# Patient Record
Sex: Female | Born: 1955 | Race: Black or African American | Hispanic: No | Smoking: Former smoker
Health system: Southern US, Community
[De-identification: ages and names within clinical notes are randomized; demographics above are authoritative.]

## PROBLEM LIST (undated history)

## (undated) DIAGNOSIS — K635 Polyp of colon: Secondary | ICD-10-CM

## (undated) DIAGNOSIS — E669 Obesity, unspecified: Secondary | ICD-10-CM

## (undated) DIAGNOSIS — C801 Malignant (primary) neoplasm, unspecified: Secondary | ICD-10-CM

## (undated) DIAGNOSIS — K219 Gastro-esophageal reflux disease without esophagitis: Secondary | ICD-10-CM

## (undated) DIAGNOSIS — I8290 Acute embolism and thrombosis of unspecified vein: Secondary | ICD-10-CM

## (undated) DIAGNOSIS — K5792 Diverticulitis of intestine, part unspecified, without perforation or abscess without bleeding: Secondary | ICD-10-CM

## (undated) DIAGNOSIS — I10 Essential (primary) hypertension: Secondary | ICD-10-CM

## (undated) DIAGNOSIS — I82890 Acute embolism and thrombosis of other specified veins: Secondary | ICD-10-CM

## (undated) HISTORY — DX: Acute embolism and thrombosis of other specified veins: I82.890

## (undated) HISTORY — DX: Essential (primary) hypertension: I10

## (undated) HISTORY — DX: Obesity, unspecified: E66.9

## (undated) HISTORY — DX: Gastro-esophageal reflux disease without esophagitis: K21.9

## (undated) HISTORY — PX: TONSILLECTOMY AND ADENOIDECTOMY: SUR1326

## (undated) HISTORY — DX: Diverticulitis of intestine, part unspecified, without perforation or abscess without bleeding: K57.92

## (undated) HISTORY — DX: Acute embolism and thrombosis of unspecified vein: I82.90

## (undated) HISTORY — DX: Malignant (primary) neoplasm, unspecified: C80.1

## (undated) HISTORY — DX: Polyp of colon: K63.5

## (undated) HISTORY — PX: OTHER SURGICAL HISTORY: SHX169

## (undated) HISTORY — PX: BREAST LUMPECTOMY: SHX2

## (undated) HISTORY — PX: ABDOMINAL HYSTERECTOMY: SHX81

---

## 1997-05-31 ENCOUNTER — Ambulatory Visit (HOSPITAL_COMMUNITY): Admission: RE | Admit: 1997-05-31 | Discharge: 1997-05-31 | Payer: Self-pay | Admitting: Cardiology

## 1997-06-22 ENCOUNTER — Ambulatory Visit (HOSPITAL_COMMUNITY): Admission: RE | Admit: 1997-06-22 | Discharge: 1997-06-22 | Payer: Self-pay | Admitting: General Surgery

## 1998-01-11 ENCOUNTER — Ambulatory Visit (HOSPITAL_BASED_OUTPATIENT_CLINIC_OR_DEPARTMENT_OTHER): Admission: RE | Admit: 1998-01-11 | Discharge: 1998-01-11 | Payer: Self-pay | Admitting: Orthopedic Surgery

## 1998-12-06 ENCOUNTER — Emergency Department (HOSPITAL_COMMUNITY): Admission: EM | Admit: 1998-12-06 | Discharge: 1998-12-06 | Payer: Self-pay | Admitting: Emergency Medicine

## 1998-12-06 ENCOUNTER — Encounter: Payer: Self-pay | Admitting: Emergency Medicine

## 2000-05-23 ENCOUNTER — Encounter: Admission: RE | Admit: 2000-05-23 | Discharge: 2000-05-23 | Payer: Self-pay | Admitting: Orthopedic Surgery

## 2000-05-23 ENCOUNTER — Encounter: Payer: Self-pay | Admitting: Orthopedic Surgery

## 2001-04-17 ENCOUNTER — Encounter: Payer: Self-pay | Admitting: Family Medicine

## 2001-04-17 ENCOUNTER — Encounter: Admission: RE | Admit: 2001-04-17 | Discharge: 2001-04-17 | Payer: Self-pay | Admitting: Family Medicine

## 2001-12-08 ENCOUNTER — Other Ambulatory Visit: Admission: RE | Admit: 2001-12-08 | Discharge: 2001-12-08 | Payer: Self-pay | Admitting: Obstetrics and Gynecology

## 2006-04-04 ENCOUNTER — Ambulatory Visit: Payer: Self-pay | Admitting: Family Medicine

## 2006-04-30 ENCOUNTER — Ambulatory Visit: Payer: Self-pay | Admitting: Family Medicine

## 2006-05-09 ENCOUNTER — Other Ambulatory Visit: Admission: RE | Admit: 2006-05-09 | Discharge: 2006-05-09 | Payer: Self-pay | Admitting: Family Medicine

## 2006-05-09 ENCOUNTER — Ambulatory Visit: Payer: Self-pay | Admitting: Family Medicine

## 2006-05-22 ENCOUNTER — Encounter: Admission: RE | Admit: 2006-05-22 | Discharge: 2006-05-22 | Payer: Self-pay | Admitting: Family Medicine

## 2006-07-26 ENCOUNTER — Ambulatory Visit (HOSPITAL_COMMUNITY): Admission: RE | Admit: 2006-07-26 | Discharge: 2006-07-26 | Payer: Self-pay | Admitting: Gastroenterology

## 2006-07-26 ENCOUNTER — Encounter (INDEPENDENT_AMBULATORY_CARE_PROVIDER_SITE_OTHER): Payer: Self-pay | Admitting: Gastroenterology

## 2006-08-22 ENCOUNTER — Ambulatory Visit: Payer: Self-pay | Admitting: Family Medicine

## 2007-04-23 ENCOUNTER — Ambulatory Visit: Payer: Self-pay | Admitting: Family Medicine

## 2007-05-28 ENCOUNTER — Ambulatory Visit (HOSPITAL_BASED_OUTPATIENT_CLINIC_OR_DEPARTMENT_OTHER): Admission: RE | Admit: 2007-05-28 | Discharge: 2007-05-28 | Payer: Self-pay | Admitting: Surgery

## 2007-05-28 ENCOUNTER — Encounter (INDEPENDENT_AMBULATORY_CARE_PROVIDER_SITE_OTHER): Payer: Self-pay | Admitting: Surgery

## 2007-06-05 ENCOUNTER — Ambulatory Visit: Payer: Self-pay | Admitting: Oncology

## 2007-06-12 ENCOUNTER — Ambulatory Visit: Payer: Self-pay | Admitting: Family Medicine

## 2007-07-02 ENCOUNTER — Encounter: Admission: RE | Admit: 2007-07-02 | Discharge: 2007-07-02 | Payer: Self-pay | Admitting: Oncology

## 2007-07-09 ENCOUNTER — Ambulatory Visit (HOSPITAL_COMMUNITY): Admission: RE | Admit: 2007-07-09 | Discharge: 2007-07-09 | Payer: Self-pay | Admitting: Oncology

## 2007-07-09 ENCOUNTER — Encounter (INDEPENDENT_AMBULATORY_CARE_PROVIDER_SITE_OTHER): Payer: Self-pay | Admitting: Interventional Radiology

## 2007-07-22 ENCOUNTER — Ambulatory Visit: Admission: RE | Admit: 2007-07-22 | Discharge: 2007-07-22 | Payer: Self-pay | Admitting: Gynecologic Oncology

## 2007-07-24 ENCOUNTER — Ambulatory Visit: Payer: Self-pay | Admitting: Oncology

## 2007-07-28 LAB — CBC WITH DIFFERENTIAL/PLATELET
BASO%: 0.5 % (ref 0.0–2.0)
EOS%: 5.7 % (ref 0.0–7.0)
HCT: 36.6 % (ref 34.8–46.6)
MCH: 32.8 pg (ref 26.0–34.0)
MCHC: 34 g/dL (ref 32.0–36.0)
MONO#: 0.6 10*3/uL (ref 0.1–0.9)
NEUT%: 52.3 % (ref 39.6–76.8)
RBC: 3.81 10*6/uL (ref 3.70–5.32)
RDW: 14.5 % (ref 11.3–14.5)
WBC: 4.1 10*3/uL (ref 3.9–10.0)
lymph#: 1.1 10*3/uL (ref 0.9–3.3)

## 2007-07-28 LAB — CA 125: CA 125: 202.8 U/mL — ABNORMAL HIGH (ref 0.0–30.2)

## 2007-08-12 ENCOUNTER — Encounter: Payer: Self-pay | Admitting: Obstetrics & Gynecology

## 2007-08-12 ENCOUNTER — Inpatient Hospital Stay (HOSPITAL_COMMUNITY): Admission: RE | Admit: 2007-08-12 | Discharge: 2007-08-18 | Payer: Self-pay | Admitting: Obstetrics & Gynecology

## 2007-08-25 ENCOUNTER — Ambulatory Visit: Payer: Self-pay | Admitting: Oncology

## 2007-09-01 ENCOUNTER — Ambulatory Visit (HOSPITAL_COMMUNITY): Admission: RE | Admit: 2007-09-01 | Discharge: 2007-09-01 | Payer: Self-pay | Admitting: Oncology

## 2007-09-03 LAB — COMPREHENSIVE METABOLIC PANEL
Albumin: 3.5 g/dL (ref 3.5–5.2)
BUN: 5 mg/dL — ABNORMAL LOW (ref 6–23)
Calcium: 9.7 mg/dL (ref 8.4–10.5)
Chloride: 102 mEq/L (ref 96–112)
Glucose, Bld: 111 mg/dL — ABNORMAL HIGH (ref 70–99)
Potassium: 3.6 mEq/L (ref 3.5–5.3)

## 2007-09-03 LAB — CBC WITH DIFFERENTIAL/PLATELET
Basophils Absolute: 0 10*3/uL (ref 0.0–0.1)
Eosinophils Absolute: 0.2 10*3/uL (ref 0.0–0.5)
HGB: 11.8 g/dL (ref 11.6–15.9)
MCV: 97.6 fL (ref 81.0–101.0)
MONO#: 0.6 10*3/uL (ref 0.1–0.9)
NEUT#: 2.3 10*3/uL (ref 1.5–6.5)
RDW: 14.6 % — ABNORMAL HIGH (ref 11.3–14.5)
WBC: 4 10*3/uL (ref 3.9–10.0)
lymph#: 0.8 10*3/uL — ABNORMAL LOW (ref 0.9–3.3)

## 2007-09-03 LAB — CANCER ANTIGEN 27.29: CA 27.29: 30 U/mL (ref 0–39)

## 2007-09-24 LAB — CBC WITH DIFFERENTIAL/PLATELET
EOS%: 1.1 % (ref 0.0–7.0)
MCH: 31.3 pg (ref 26.0–34.0)
MCV: 91.8 fL (ref 81.0–101.0)
MONO%: 16.9 % — ABNORMAL HIGH (ref 0.0–13.0)
NEUT#: 3.9 10*3/uL (ref 1.5–6.5)
RBC: 3.36 10*6/uL — ABNORMAL LOW (ref 3.70–5.32)
RDW: 12.7 % (ref 11.3–14.5)

## 2007-10-01 LAB — CBC WITH DIFFERENTIAL/PLATELET
Eosinophils Absolute: 0 10*3/uL (ref 0.0–0.5)
MONO#: 0.2 10*3/uL (ref 0.1–0.9)
NEUT#: 8.6 10*3/uL — ABNORMAL HIGH (ref 1.5–6.5)
RBC: 3.25 10*6/uL — ABNORMAL LOW (ref 3.70–5.32)
RDW: 13.1 % (ref 11.3–14.5)
WBC: 9.4 10*3/uL (ref 3.9–10.0)
lymph#: 0.6 10*3/uL — ABNORMAL LOW (ref 0.9–3.3)

## 2007-10-01 LAB — COMPREHENSIVE METABOLIC PANEL
Albumin: 3.6 g/dL (ref 3.5–5.2)
Alkaline Phosphatase: 89 U/L (ref 39–117)
BUN: 16 mg/dL (ref 6–23)
Calcium: 9.1 mg/dL (ref 8.4–10.5)
Glucose, Bld: 131 mg/dL — ABNORMAL HIGH (ref 70–99)
Potassium: 3.8 mEq/L (ref 3.5–5.3)

## 2007-10-03 ENCOUNTER — Ambulatory Visit: Admission: RE | Admit: 2007-10-03 | Discharge: 2007-10-03 | Payer: Self-pay | Admitting: Gynecology

## 2007-10-03 ENCOUNTER — Ambulatory Visit: Payer: Self-pay | Admitting: Oncology

## 2007-10-06 ENCOUNTER — Ambulatory Visit: Payer: Self-pay | Admitting: Oncology

## 2007-10-06 ENCOUNTER — Inpatient Hospital Stay (HOSPITAL_COMMUNITY): Admission: EM | Admit: 2007-10-06 | Discharge: 2007-10-10 | Payer: Self-pay | Admitting: Emergency Medicine

## 2007-10-14 LAB — CBC WITH DIFFERENTIAL/PLATELET
Basophils Absolute: 0.1 10*3/uL (ref 0.0–0.1)
Eosinophils Absolute: 0 10*3/uL (ref 0.0–0.5)
HCT: 27.6 % — ABNORMAL LOW (ref 34.8–46.6)
HGB: 9.2 g/dL — ABNORMAL LOW (ref 11.6–15.9)
LYMPH%: 8.8 % — ABNORMAL LOW (ref 14.0–48.0)
MONO#: 1.3 10*3/uL — ABNORMAL HIGH (ref 0.1–0.9)
NEUT#: 12.5 10*3/uL — ABNORMAL HIGH (ref 1.5–6.5)
NEUT%: 82.1 % — ABNORMAL HIGH (ref 39.6–76.8)
Platelets: 293 10*3/uL (ref 145–400)
WBC: 15.2 10*3/uL — ABNORMAL HIGH (ref 3.9–10.0)

## 2007-10-22 ENCOUNTER — Encounter (HOSPITAL_COMMUNITY): Admission: RE | Admit: 2007-10-22 | Discharge: 2007-10-25 | Payer: Self-pay | Admitting: Oncology

## 2007-10-22 ENCOUNTER — Ambulatory Visit: Payer: Self-pay | Admitting: Oncology

## 2007-10-22 LAB — COMPREHENSIVE METABOLIC PANEL
Albumin: 3.7 g/dL (ref 3.5–5.2)
BUN: 15 mg/dL (ref 6–23)
CO2: 20 mEq/L (ref 19–32)
Calcium: 9.2 mg/dL (ref 8.4–10.5)
Chloride: 102 mEq/L (ref 96–112)
Creatinine, Ser: 0.83 mg/dL (ref 0.40–1.20)
Glucose, Bld: 171 mg/dL — ABNORMAL HIGH (ref 70–99)
Potassium: 3.9 mEq/L (ref 3.5–5.3)

## 2007-10-22 LAB — CBC WITH DIFFERENTIAL/PLATELET
BASO%: 0 % (ref 0.0–2.0)
HCT: 23 % — ABNORMAL LOW (ref 34.8–46.6)
LYMPH%: 4.3 % — ABNORMAL LOW (ref 14.0–48.0)
MCHC: 34.5 g/dL (ref 32.0–36.0)
MONO#: 0.5 10*3/uL (ref 0.1–0.9)
NEUT%: 91.9 % — ABNORMAL HIGH (ref 39.6–76.8)
Platelets: 242 10*3/uL (ref 145–400)
WBC: 12.7 10*3/uL — ABNORMAL HIGH (ref 3.9–10.0)

## 2007-10-22 LAB — TYPE & CROSSMATCH - CHCC

## 2007-10-29 LAB — CBC WITH DIFFERENTIAL/PLATELET
Eosinophils Absolute: 0 10*3/uL (ref 0.0–0.5)
MCV: 92.7 fL (ref 81.0–101.0)
MONO#: 0.9 10*3/uL (ref 0.1–0.9)
MONO%: 6.7 % (ref 0.0–13.0)
NEUT#: 11.8 10*3/uL — ABNORMAL HIGH (ref 1.5–6.5)
RBC: 3.19 10*6/uL — ABNORMAL LOW (ref 3.70–5.32)
RDW: 16.9 % — ABNORMAL HIGH (ref 11.3–14.5)
WBC: 13.5 10*3/uL — ABNORMAL HIGH (ref 3.9–10.0)
lymph#: 0.8 10*3/uL — ABNORMAL LOW (ref 0.9–3.3)

## 2007-10-29 LAB — COMPREHENSIVE METABOLIC PANEL
AST: 15 U/L (ref 0–37)
Albumin: 3.9 g/dL (ref 3.5–5.2)
Alkaline Phosphatase: 95 U/L (ref 39–117)
Chloride: 102 mEq/L (ref 96–112)
Glucose, Bld: 95 mg/dL (ref 70–99)
Potassium: 3.7 mEq/L (ref 3.5–5.3)
Sodium: 137 mEq/L (ref 135–145)
Total Protein: 7.7 g/dL (ref 6.0–8.3)

## 2007-11-06 LAB — CBC WITH DIFFERENTIAL/PLATELET
Eosinophils Absolute: 0 10*3/uL (ref 0.0–0.5)
MONO#: 0.7 10*3/uL (ref 0.1–0.9)
NEUT#: 4.3 10*3/uL (ref 1.5–6.5)
Platelets: 164 10*3/uL (ref 145–400)
RBC: 3.15 10*6/uL — ABNORMAL LOW (ref 3.70–5.32)
RDW: 16.2 % — ABNORMAL HIGH (ref 11.3–14.5)
WBC: 6.1 10*3/uL (ref 3.9–10.0)
lymph#: 1.1 10*3/uL (ref 0.9–3.3)

## 2007-11-19 LAB — COMPREHENSIVE METABOLIC PANEL
CO2: 20 mEq/L (ref 19–32)
Calcium: 9.7 mg/dL (ref 8.4–10.5)
Chloride: 97 mEq/L (ref 96–112)
Creatinine, Ser: 0.88 mg/dL (ref 0.40–1.20)
Glucose, Bld: 127 mg/dL — ABNORMAL HIGH (ref 70–99)
Total Bilirubin: 0.3 mg/dL (ref 0.3–1.2)
Total Protein: 7.6 g/dL (ref 6.0–8.3)

## 2007-11-19 LAB — CBC WITH DIFFERENTIAL/PLATELET
Eosinophils Absolute: 0 10*3/uL (ref 0.0–0.5)
HCT: 26.7 % — ABNORMAL LOW (ref 34.8–46.6)
HGB: 9.3 g/dL — ABNORMAL LOW (ref 11.6–15.9)
LYMPH%: 5.5 % — ABNORMAL LOW (ref 14.0–48.0)
MONO#: 0.4 10*3/uL (ref 0.1–0.9)
NEUT#: 10.6 10*3/uL — ABNORMAL HIGH (ref 1.5–6.5)
NEUT%: 91 % — ABNORMAL HIGH (ref 39.6–76.8)
Platelets: 324 10*3/uL (ref 145–400)
WBC: 11.7 10*3/uL — ABNORMAL HIGH (ref 3.9–10.0)

## 2007-11-26 ENCOUNTER — Encounter (HOSPITAL_COMMUNITY): Admission: RE | Admit: 2007-11-26 | Discharge: 2008-02-05 | Payer: Self-pay | Admitting: Oncology

## 2007-11-26 LAB — CBC WITH DIFFERENTIAL/PLATELET
BASO%: 2.3 % — ABNORMAL HIGH (ref 0.0–2.0)
Basophils Absolute: 0.1 10*3/uL (ref 0.0–0.1)
EOS%: 0.9 % (ref 0.0–7.0)
HGB: 7.9 g/dL — ABNORMAL LOW (ref 11.6–15.9)
MCH: 29.9 pg (ref 26.0–34.0)
MCHC: 32.3 g/dL (ref 32.0–36.0)
MONO#: 0.7 10*3/uL (ref 0.1–0.9)
RDW: 16.9 % — ABNORMAL HIGH (ref 11.3–14.5)
WBC: 2.6 10*3/uL — ABNORMAL LOW (ref 3.9–10.0)
lymph#: 0.8 10*3/uL — ABNORMAL LOW (ref 0.9–3.3)

## 2007-12-03 LAB — CBC WITH DIFFERENTIAL/PLATELET
Eosinophils Absolute: 0 10*3/uL (ref 0.0–0.5)
HGB: 10.6 g/dL — ABNORMAL LOW (ref 11.6–15.9)
MONO#: 0.4 10*3/uL (ref 0.1–0.9)
NEUT#: 16.8 10*3/uL — ABNORMAL HIGH (ref 1.5–6.5)
RBC: 3.31 10*6/uL — ABNORMAL LOW (ref 3.70–5.32)
RDW: 16.4 % — ABNORMAL HIGH (ref 11.3–14.5)
WBC: 18.1 10*3/uL — ABNORMAL HIGH (ref 3.9–10.0)

## 2007-12-08 ENCOUNTER — Ambulatory Visit: Payer: Self-pay | Admitting: Oncology

## 2007-12-10 LAB — CBC WITH DIFFERENTIAL/PLATELET
Eosinophils Absolute: 0 10*3/uL (ref 0.0–0.5)
MONO#: 0.7 10*3/uL (ref 0.1–0.9)
NEUT#: 13.8 10*3/uL — ABNORMAL HIGH (ref 1.5–6.5)
RBC: 3.45 10*6/uL — ABNORMAL LOW (ref 3.70–5.32)
RDW: 16.7 % — ABNORMAL HIGH (ref 11.3–14.5)
WBC: 15.2 10*3/uL — ABNORMAL HIGH (ref 3.9–10.0)

## 2007-12-17 LAB — COMPREHENSIVE METABOLIC PANEL
AST: 13 U/L (ref 0–37)
Albumin: 3.6 g/dL (ref 3.5–5.2)
BUN: 17 mg/dL (ref 6–23)
CO2: 25 mEq/L (ref 19–32)
Calcium: 9.3 mg/dL (ref 8.4–10.5)
Chloride: 102 mEq/L (ref 96–112)
Glucose, Bld: 96 mg/dL (ref 70–99)
Potassium: 4 mEq/L (ref 3.5–5.3)

## 2007-12-17 LAB — CBC WITH DIFFERENTIAL/PLATELET
Basophils Absolute: 0 10*3/uL (ref 0.0–0.1)
Eosinophils Absolute: 0 10*3/uL (ref 0.0–0.5)
HCT: 26.7 % — ABNORMAL LOW (ref 34.8–46.6)
HGB: 9.1 g/dL — ABNORMAL LOW (ref 11.6–15.9)
LYMPH%: 16.8 % (ref 14.0–48.0)
MONO#: 0.8 10*3/uL (ref 0.1–0.9)
NEUT#: 3.8 10*3/uL (ref 1.5–6.5)
NEUT%: 68.5 % (ref 39.6–76.8)
Platelets: 167 10*3/uL (ref 145–400)
WBC: 5.6 10*3/uL (ref 3.9–10.0)

## 2007-12-31 LAB — CBC WITH DIFFERENTIAL/PLATELET
EOS%: 0 % (ref 0.0–7.0)
Eosinophils Absolute: 0 10*3/uL (ref 0.0–0.5)
MCV: 91.9 fL (ref 81.0–101.0)
MONO%: 6.1 % (ref 0.0–13.0)
NEUT#: 10.6 10*3/uL — ABNORMAL HIGH (ref 1.5–6.5)
RBC: 2.96 10*6/uL — ABNORMAL LOW (ref 3.70–5.32)
RDW: 16.6 % — ABNORMAL HIGH (ref 11.3–14.5)
WBC: 12 10*3/uL — ABNORMAL HIGH (ref 3.9–10.0)

## 2008-01-07 LAB — CBC WITH DIFFERENTIAL/PLATELET
BASO%: 0.5 % (ref 0.0–2.0)
EOS%: 0.4 % (ref 0.0–7.0)
HCT: 23 % — ABNORMAL LOW (ref 34.8–46.6)
MCH: 32.8 pg (ref 26.0–34.0)
MCHC: 34.4 g/dL (ref 32.0–36.0)
NEUT%: 58 % (ref 39.6–76.8)
RBC: 2.41 10*6/uL — ABNORMAL LOW (ref 3.70–5.32)
WBC: 2.9 10*3/uL — ABNORMAL LOW (ref 3.9–10.0)
lymph#: 0.7 10*3/uL — ABNORMAL LOW (ref 0.9–3.3)

## 2008-01-08 LAB — TYPE & CROSSMATCH - CHCC

## 2008-01-09 ENCOUNTER — Ambulatory Visit: Admission: RE | Admit: 2008-01-09 | Discharge: 2008-04-07 | Payer: Self-pay | Admitting: Radiation Oncology

## 2008-01-19 LAB — CBC WITH DIFFERENTIAL/PLATELET
EOS%: 1 % (ref 0.0–7.0)
Eosinophils Absolute: 0.1 10*3/uL (ref 0.0–0.5)
MCH: 31.3 pg (ref 26.0–34.0)
MCV: 91.7 fL (ref 81.0–101.0)
MONO%: 14.1 % — ABNORMAL HIGH (ref 0.0–13.0)
NEUT#: 4.7 10*3/uL (ref 1.5–6.5)
RBC: 3.48 10*6/uL — ABNORMAL LOW (ref 3.70–5.32)
RDW: 17.3 % — ABNORMAL HIGH (ref 11.3–14.5)
lymph#: 1.3 10*3/uL (ref 0.9–3.3)

## 2008-03-05 ENCOUNTER — Ambulatory Visit: Payer: Self-pay | Admitting: Oncology

## 2008-03-29 LAB — COMPREHENSIVE METABOLIC PANEL
ALT: 13 U/L (ref 0–35)
Albumin: 4 g/dL (ref 3.5–5.2)
CO2: 22 mEq/L (ref 19–32)
Calcium: 9.6 mg/dL (ref 8.4–10.5)
Chloride: 103 mEq/L (ref 96–112)
Glucose, Bld: 163 mg/dL — ABNORMAL HIGH (ref 70–99)
Potassium: 3.8 mEq/L (ref 3.5–5.3)
Sodium: 137 mEq/L (ref 135–145)
Total Bilirubin: 0.4 mg/dL (ref 0.3–1.2)
Total Protein: 8 g/dL (ref 6.0–8.3)

## 2008-03-29 LAB — CBC WITH DIFFERENTIAL/PLATELET
BASO%: 0.3 % (ref 0.0–2.0)
Eosinophils Absolute: 0.1 10*3/uL (ref 0.0–0.5)
MCHC: 34 g/dL (ref 31.5–36.0)
MONO#: 0.6 10*3/uL (ref 0.1–0.9)
NEUT#: 2.6 10*3/uL (ref 1.5–6.5)
RBC: 3.51 10*6/uL — ABNORMAL LOW (ref 3.70–5.45)
RDW: 15.7 % — ABNORMAL HIGH (ref 11.2–14.5)
WBC: 4.3 10*3/uL (ref 3.9–10.3)
lymph#: 0.9 10*3/uL (ref 0.9–3.3)

## 2008-04-07 ENCOUNTER — Ambulatory Visit (HOSPITAL_COMMUNITY): Admission: RE | Admit: 2008-04-07 | Discharge: 2008-04-07 | Payer: Self-pay | Admitting: Oncology

## 2008-05-05 ENCOUNTER — Ambulatory Visit: Payer: Self-pay | Admitting: Family Medicine

## 2008-05-10 ENCOUNTER — Encounter: Admission: RE | Admit: 2008-05-10 | Discharge: 2008-05-10 | Payer: Self-pay | Admitting: Oncology

## 2008-06-16 ENCOUNTER — Ambulatory Visit: Payer: Self-pay | Admitting: Oncology

## 2008-06-18 LAB — COMPREHENSIVE METABOLIC PANEL
AST: 29 U/L (ref 0–37)
Albumin: 3.7 g/dL (ref 3.5–5.2)
BUN: 14 mg/dL (ref 6–23)
Calcium: 9.9 mg/dL (ref 8.4–10.5)
Chloride: 101 mEq/L (ref 96–112)
Creatinine, Ser: 0.98 mg/dL (ref 0.40–1.20)
Glucose, Bld: 109 mg/dL — ABNORMAL HIGH (ref 70–99)
Potassium: 3.7 mEq/L (ref 3.5–5.3)

## 2008-06-18 LAB — CANCER ANTIGEN 27.29: CA 27.29: 80 U/mL — ABNORMAL HIGH (ref 0–39)

## 2008-06-18 LAB — CBC WITH DIFFERENTIAL/PLATELET
Eosinophils Absolute: 0.1 10*3/uL (ref 0.0–0.5)
HCT: 32.6 % — ABNORMAL LOW (ref 34.8–46.6)
LYMPH%: 28.9 % (ref 14.0–49.7)
MCHC: 33.4 g/dL (ref 31.5–36.0)
MCV: 100.3 fL (ref 79.5–101.0)
MONO#: 0.5 10*3/uL (ref 0.1–0.9)
MONO%: 12.2 % (ref 0.0–14.0)
NEUT#: 2.4 10*3/uL (ref 1.5–6.5)
NEUT%: 56.8 % (ref 38.4–76.8)
Platelets: 169 10*3/uL (ref 145–400)
WBC: 4.2 10*3/uL (ref 3.9–10.3)

## 2008-07-21 ENCOUNTER — Ambulatory Visit (HOSPITAL_COMMUNITY): Admission: RE | Admit: 2008-07-21 | Discharge: 2008-07-21 | Payer: Self-pay | Admitting: Oncology

## 2008-09-14 ENCOUNTER — Ambulatory Visit: Payer: Self-pay | Admitting: Oncology

## 2008-09-16 LAB — CBC & DIFF AND RETIC
BASO%: 0.1 % (ref 0.0–2.0)
Eosinophils Absolute: 0.2 10*3/uL (ref 0.0–0.5)
HCT: 32.1 % — ABNORMAL LOW (ref 34.8–46.6)
Immature Retic Fract: 14.9 % — ABNORMAL HIGH (ref 0.00–10.70)
MCHC: 34 g/dL (ref 31.5–36.0)
MONO#: 0.7 10*3/uL (ref 0.1–0.9)
NEUT#: 5 10*3/uL (ref 1.5–6.5)
NEUT%: 68.4 % (ref 38.4–76.8)
Platelets: 185 10*3/uL (ref 145–400)
Retic %: 3.05 % — ABNORMAL HIGH (ref 0.50–1.50)
WBC: 7.3 10*3/uL (ref 3.9–10.3)
lymph#: 1.4 10*3/uL (ref 0.9–3.3)

## 2008-09-16 LAB — MORPHOLOGY: PLT EST: ADEQUATE

## 2008-09-20 LAB — HEMOGLOBINOPATHY EVALUATION: Hgb A2 Quant: 2.5 % (ref 2.2–3.2)

## 2008-09-20 LAB — FOLATE RBC: RBC Folate: 484 ng/mL (ref 180–600)

## 2008-09-20 LAB — IRON AND TIBC
Iron: 35 ug/dL — ABNORMAL LOW (ref 42–145)
TIBC: 303 ug/dL (ref 250–470)

## 2008-09-20 LAB — FERRITIN: Ferritin: 414 ng/mL — ABNORMAL HIGH (ref 10–291)

## 2008-09-20 LAB — VITAMIN B12: Vitamin B-12: 249 pg/mL (ref 211–911)

## 2008-09-20 LAB — CANCER ANTIGEN 27.29: CA 27.29: 41 U/mL — ABNORMAL HIGH (ref 0–39)

## 2008-12-20 ENCOUNTER — Ambulatory Visit: Payer: Self-pay | Admitting: Oncology

## 2008-12-22 LAB — CBC & DIFF AND RETIC
Eosinophils Absolute: 0.1 10*3/uL (ref 0.0–0.5)
HCT: 33.1 % — ABNORMAL LOW (ref 34.8–46.6)
LYMPH%: 22.3 % (ref 14.0–49.7)
MCV: 98.2 fL (ref 79.5–101.0)
MONO#: 0.6 10*3/uL (ref 0.1–0.9)
MONO%: 10.9 % (ref 0.0–14.0)
NEUT#: 3.5 10*3/uL (ref 1.5–6.5)
NEUT%: 64 % (ref 38.4–76.8)
Platelets: 204 10*3/uL (ref 145–400)
RBC: 3.37 10*6/uL — ABNORMAL LOW (ref 3.70–5.45)
Retic %: 2.45 % — ABNORMAL HIGH (ref 0.50–1.50)
WBC: 5.4 10*3/uL (ref 3.9–10.3)

## 2008-12-22 LAB — MORPHOLOGY

## 2008-12-22 LAB — COMPREHENSIVE METABOLIC PANEL
Albumin: 3.7 g/dL (ref 3.5–5.2)
Alkaline Phosphatase: 122 U/L — ABNORMAL HIGH (ref 39–117)
BUN: 10 mg/dL (ref 6–23)
CO2: 22 mEq/L (ref 19–32)
Calcium: 8.9 mg/dL (ref 8.4–10.5)
Chloride: 104 mEq/L (ref 96–112)
Glucose, Bld: 112 mg/dL — ABNORMAL HIGH (ref 70–99)
Potassium: 3.5 mEq/L (ref 3.5–5.3)
Sodium: 138 mEq/L (ref 135–145)
Total Protein: 7.2 g/dL (ref 6.0–8.3)

## 2008-12-22 LAB — IRON AND TIBC
%SAT: 23 % (ref 20–55)
Iron: 67 ug/dL (ref 42–145)

## 2008-12-22 LAB — FERRITIN: Ferritin: 459 ng/mL — ABNORMAL HIGH (ref 10–291)

## 2008-12-22 LAB — CANCER ANTIGEN 27.29: CA 27.29: 34 U/mL (ref 0–39)

## 2009-02-02 ENCOUNTER — Ambulatory Visit: Admission: RE | Admit: 2009-02-02 | Discharge: 2009-02-02 | Payer: Self-pay | Admitting: Gynecologic Oncology

## 2009-04-11 ENCOUNTER — Ambulatory Visit: Payer: Self-pay | Admitting: Oncology

## 2009-04-13 LAB — CBC WITH DIFFERENTIAL/PLATELET
BASO%: 0.3 % (ref 0.0–2.0)
EOS%: 1.4 % (ref 0.0–7.0)
LYMPH%: 20.5 % (ref 14.0–49.7)
MCHC: 34.3 g/dL (ref 31.5–36.0)
MCV: 101.6 fL — ABNORMAL HIGH (ref 79.5–101.0)
MONO%: 13 % (ref 0.0–14.0)
Platelets: 176 10*3/uL (ref 145–400)
RBC: 3.42 10*6/uL — ABNORMAL LOW (ref 3.70–5.45)

## 2009-04-13 LAB — COMPREHENSIVE METABOLIC PANEL
ALT: 36 U/L — ABNORMAL HIGH (ref 0–35)
AST: 54 U/L — ABNORMAL HIGH (ref 0–37)
Alkaline Phosphatase: 128 U/L — ABNORMAL HIGH (ref 39–117)
Sodium: 138 mEq/L (ref 135–145)
Total Bilirubin: 0.5 mg/dL (ref 0.3–1.2)
Total Protein: 7.6 g/dL (ref 6.0–8.3)

## 2009-04-13 LAB — CANCER ANTIGEN 27.29: CA 27.29: 44 U/mL — ABNORMAL HIGH (ref 0–39)

## 2009-04-25 ENCOUNTER — Ambulatory Visit (HOSPITAL_COMMUNITY): Admission: RE | Admit: 2009-04-25 | Discharge: 2009-04-25 | Payer: Self-pay | Admitting: Oncology

## 2009-06-30 ENCOUNTER — Ambulatory Visit: Payer: Self-pay | Admitting: Family Medicine

## 2009-07-06 ENCOUNTER — Encounter: Admission: RE | Admit: 2009-07-06 | Discharge: 2009-07-06 | Payer: Self-pay | Admitting: Family Medicine

## 2009-07-10 ENCOUNTER — Encounter: Admission: RE | Admit: 2009-07-10 | Discharge: 2009-07-10 | Payer: Self-pay | Admitting: Oncology

## 2009-07-13 ENCOUNTER — Ambulatory Visit: Admission: RE | Admit: 2009-07-13 | Discharge: 2009-07-13 | Payer: Self-pay | Admitting: Gynecologic Oncology

## 2009-07-13 ENCOUNTER — Ambulatory Visit: Payer: Self-pay | Admitting: Oncology

## 2009-07-13 LAB — CA 125: CA 125: 7.2 U/mL (ref 0.0–30.2)

## 2009-08-01 ENCOUNTER — Ambulatory Visit: Payer: Self-pay | Admitting: Family Medicine

## 2009-08-10 ENCOUNTER — Ambulatory Visit: Payer: Self-pay | Admitting: Family Medicine

## 2009-09-12 ENCOUNTER — Ambulatory Visit: Payer: Self-pay | Admitting: Family Medicine

## 2009-09-14 ENCOUNTER — Encounter: Admission: RE | Admit: 2009-09-14 | Discharge: 2009-09-14 | Payer: Self-pay | Admitting: Family Medicine

## 2009-10-11 ENCOUNTER — Ambulatory Visit: Payer: Self-pay | Admitting: Oncology

## 2009-10-14 LAB — CBC WITH DIFFERENTIAL/PLATELET
Basophils Absolute: 0 10*3/uL (ref 0.0–0.1)
EOS%: 2.6 % (ref 0.0–7.0)
HCT: 36.2 % (ref 34.8–46.6)
MCHC: 33.2 g/dL (ref 31.5–36.0)
MONO#: 0.6 10*3/uL (ref 0.1–0.9)
NEUT#: 3.9 10*3/uL (ref 1.5–6.5)
NEUT%: 68.7 % (ref 38.4–76.8)
Platelets: 235 10*3/uL (ref 145–400)

## 2009-10-14 LAB — LACTATE DEHYDROGENASE: LDH: 160 U/L (ref 94–250)

## 2009-10-14 LAB — COMPREHENSIVE METABOLIC PANEL
ALT: 22 U/L (ref 0–35)
AST: 29 U/L (ref 0–37)
BUN: 11 mg/dL (ref 6–23)
CO2: 28 mEq/L (ref 19–32)
Calcium: 9.4 mg/dL (ref 8.4–10.5)
Sodium: 140 mEq/L (ref 135–145)
Total Protein: 7.8 g/dL (ref 6.0–8.3)

## 2009-10-15 LAB — CA 125: CA 125: 6.4 U/mL (ref 0.0–30.2)

## 2009-10-15 LAB — CANCER ANTIGEN 27.29: CA 27.29: 29 U/mL (ref 0–39)

## 2010-02-02 ENCOUNTER — Ambulatory Visit
Admission: RE | Admit: 2010-02-02 | Discharge: 2010-02-02 | Payer: Self-pay | Source: Home / Self Care | Attending: Gynecologic Oncology | Admitting: Gynecologic Oncology

## 2010-02-25 ENCOUNTER — Other Ambulatory Visit: Payer: Self-pay | Admitting: Oncology

## 2010-02-25 DIAGNOSIS — Z853 Personal history of malignant neoplasm of breast: Secondary | ICD-10-CM

## 2010-02-25 DIAGNOSIS — Z780 Asymptomatic menopausal state: Secondary | ICD-10-CM

## 2010-02-26 ENCOUNTER — Encounter: Payer: Self-pay | Admitting: Oncology

## 2010-02-26 ENCOUNTER — Encounter: Payer: Self-pay | Admitting: Family Medicine

## 2010-04-10 ENCOUNTER — Ambulatory Visit
Admission: RE | Admit: 2010-04-10 | Discharge: 2010-04-10 | Disposition: A | Discharge status: Home / Self Care | Payer: Medicare Other | Source: Ambulatory Visit | Attending: Oncology | Admitting: Oncology

## 2010-04-10 DIAGNOSIS — Z780 Asymptomatic menopausal state: Secondary | ICD-10-CM

## 2010-04-10 DIAGNOSIS — Z853 Personal history of malignant neoplasm of breast: Secondary | ICD-10-CM

## 2010-04-17 ENCOUNTER — Other Ambulatory Visit: Payer: Self-pay | Admitting: Oncology

## 2010-04-17 ENCOUNTER — Encounter (HOSPITAL_BASED_OUTPATIENT_CLINIC_OR_DEPARTMENT_OTHER): Payer: Medicare Other | Admitting: Oncology

## 2010-04-17 DIAGNOSIS — D649 Anemia, unspecified: Secondary | ICD-10-CM

## 2010-04-17 DIAGNOSIS — M818 Other osteoporosis without current pathological fracture: Secondary | ICD-10-CM

## 2010-04-17 DIAGNOSIS — C5700 Malignant neoplasm of unspecified fallopian tube: Secondary | ICD-10-CM

## 2010-04-17 DIAGNOSIS — C50219 Malignant neoplasm of upper-inner quadrant of unspecified female breast: Secondary | ICD-10-CM

## 2010-04-17 LAB — CBC WITH DIFFERENTIAL/PLATELET
Eosinophils Absolute: 0.1 10*3/uL (ref 0.0–0.5)
LYMPH%: 26 % (ref 14.0–49.7)
MONO#: 0.6 10*3/uL (ref 0.1–0.9)
NEUT#: 4 10*3/uL (ref 1.5–6.5)
Platelets: 172 10*3/uL (ref 145–400)
RBC: 3.56 10*6/uL — ABNORMAL LOW (ref 3.70–5.45)
WBC: 6.4 10*3/uL (ref 3.9–10.3)
nRBC: 0 % (ref 0–0)

## 2010-04-17 LAB — CA 125: CA 125: 7.8 U/mL (ref 0.0–30.2)

## 2010-04-18 LAB — CANCER ANTIGEN 27.29: CA 27.29: 28 U/mL (ref 0–39)

## 2010-04-18 LAB — COMPREHENSIVE METABOLIC PANEL
ALT: 22 U/L (ref 0–35)
AST: 38 U/L — ABNORMAL HIGH (ref 0–37)
Alkaline Phosphatase: 105 U/L (ref 39–117)
CO2: 24 mEq/L (ref 19–32)
Creatinine, Ser: 0.96 mg/dL (ref 0.40–1.20)
Total Bilirubin: 0.6 mg/dL (ref 0.3–1.2)

## 2010-04-18 LAB — LACTATE DEHYDROGENASE: LDH: 202 U/L (ref 94–250)

## 2010-04-18 LAB — VITAMIN D 25 HYDROXY (VIT D DEFICIENCY, FRACTURES): Vit D, 25-Hydroxy: 15 ng/mL — ABNORMAL LOW (ref 30–89)

## 2010-05-08 LAB — CA 125: CA 125: 10.3 U/mL (ref 0.0–30.2)

## 2010-06-20 NOTE — Consult Note (Signed)
Abigail [name]            ACCOUNT NO.:  [id number]   MEDICAL RECORD NO.:  [id number]          PATIENT TYPE:  INP   LOCATION:  1528                         FACILITY:  [facility]   PHYSICIAN:  Mobolaji B. Corky Downs, M.D.DATE OF BIRTH:  Nov 01, 1955   DATE OF CONSULTATION:  DATE OF DISCHARGE:                                 CONSULTATION   PRIMARY CARE PHYSICIAN:  Sharlot Gowda, M.D.   REASON FOR REFERRAL:  Severe hyponatremia.   HISTORY OF PRESENT ILLNESS:  Abigail [name] is a pleasant 55 year old  African American female who was hospitalized on August 12, 2007.  She  underwent exploratory laparotomy, total abdominal hysterectomy,  bilateral salpingo-oophorectomy, omentectomy, peritoneal resection and  optimal tumor debulking on August 12, 2007.  Prior to admission, she had a  sodium of 138 on August 07, 2007.  Sodium was checked on first postop day  to be 127.  She was on hypotonic saline, postoperatively.  This was  changed to normal saline and sodium was rechecked on postop day #2,  which is today.  Sodium was found to be 120.  We are being asked to help  manage severe hyponatremia.  The patient has no confusion.  No myotonic  attacks.  No headaches or seizures.  Postop pain appears to be under  control.  She has been in good, positive balance since postop period.  Urine output is adequate.   REVIEW OF SYSTEMS:  She denies chest pain, shortness of breath,  headaches, cough, no diarrhea, vomiting.  Appetite is good.   PAST MEDICAL HISTORY:  1. Hypertension.  2. Left breast cancer.  Status post lumpectomy by Dr. Luisa Hart.  3. Recent exploratory laparotomy with total abdominal hysterectomy,      bilateral salpingo-oophorectomy, omentectomy and peritoneal      resection with optimal tumor debulking by Dr. Duard Brady and Tamela Oddi on August 12, 2007.   PAST SURGICAL HISTORY:  As above.   CURRENT MEDICATIONS:  1. Amlodipine 10 mg daily.  2. Lotensin 40 mg daily.  3. Dulcolax suppository 1  daily.  4. Lovenox 40 mg daily.  5. Ativan 0.5 mg t.i.d.  6. Normal saline 125 cc/hour.  7. Percocet 1-2, q.4 p.r.n.  8. Phenergan 12.5 mg IV q.4 h. p.r.n.  9. Restoril 30 mg nightly p.r.n.   ALLERGIES:  CODEINE CAUSES ITCHING.   FAMILY HISTORY:  Daughter had breast cancer at the age of 61.  Mother  had breast cancer at the age of 44 and ovarian cancer at the age of 23.   SOCIAL HISTORY:  She quit smoking 3 months ago, after the diagnosis of  cancer.  She occasionally drinks alcohol.  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  The patient is divorced.   PHYSICAL EXAMINATION:  CURRENT VITALS:  Temperature T-max 100.4, pulse  97, respiratory rate 22, blood pressure 138/69, O2 sats 96% on room air.  On examination, the patient is awake, alert, oriented in time, place and  person.  Normocephalic, atraumatic head.  Pupils equal, round and  reactive to light.  Extraocular muscle movement intact.  LUNGS:  Clear clinically  to auscultation.  CVS:  S1 and S2 regular.  No murmur or gallop.  ABDOMEN:  Not distended, soft.  Subumbilical incision site looks clean  and dry.  No significant tenderness.  Bowel sounds present.  EXTREMITIES:  No pedal edema or calf tenderness.  Dorsalis pedis pulses  palpable bilaterally.   LABORATORY DATA:  Sodium 120, potassium 3.4, chloride 82, CO2 31,  glucose 111, BUN 6, creatinine 0.69, calcium 8.7.   ASSESSMENT AND RECOMMENDATIONS:  Abigail [name] is a 55 year old African  American female who developed hyponatremia on postoperative day #1.  Sodium drastically dropped from 138 prior to admission to 120 today.  She appears to be asymptomatic.  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  She has been in fairly good positive  balance (I's and O's) since surgery.  She was initially on hypotonic  solution as  well.   RECOMMENDATION:  Fluid restriction 1000 mL per day with saline lock,  normal saline.  The patient cant take p.o. fluids.  Urine sodium, urine  osmolality and serum osmolality are pending at this time.  I agree with  holding hydrochlorothiazide.  Check BMET again at 2:00 a.m. and at 8:00  a.m..  Will check cortisol level, TSH.  Give  regular soft diet.  If there is no improvement with fluid restriction,  consideration may be given to Conivaptan infusion.   Will replace potassium, give 40 mEq p.o. x1 now.  Thank you for the  consult.  Will follow.      Mobolaji B. Corky Downs, M.D.  Electronically Signed     MBB/MEDQ  D:  08/14/2007  T:  08/14/2007  Job:  [id number]   cc:   Sharlot Gowda, M.D.  Fax: [phone]   Roseanna Rainbow, M.D.  Fax: [phone]   Paola A. Duard Brady, MD  [address]  [place], [place] [address]   Dr. Jim Desanctis, RN

## 2010-06-20 NOTE — H&P (Signed)
NAMEDIONICIA, CERRITOS NO.:  [id number]   MEDICAL RECORD NO.:  [id number]          PATIENT TYPE:  INP   LOCATION:  0110                         [facility]ACILITY:  [facility]   PHYSICIAN:  Genene Churn. Granfortuna, M.D.DATE OF BIRTH:  [birth year]   DATE OF ADMISSION:  10/06/2007  DATE OF DISCHARGE:                              HISTORY & PHYSICAL   PRIMARY ONCOLOGIST:  Pierce Crane, MD   CHIEF COMPLAINT:  Fever of 100.7 with chills, status post chemotherapy.   HISTORY OF PRESENT ILLNESS:  Abigail [name] is a 55 year old  African American female who was recently diagnosed with two primary  cancers.  She was diagnosed with locally advanced breast cancer, stage  IIB, status post lumpectomy on May 28, 2007, and a fallopian tube  primary status post total abdominal hysterectomy with bilateral salpingo-  oophorectomy, omentectomy, peritoneal resection on August 12, 2007.  She  was found to be a stage IIIC which was thought to be optimally debulked.  She was then started on carboplatin with an AUC of 6 and Paclitaxel of  175 mg per meter squared.  A total of planned 6 cycles q.3 weekly.  She  went on to receive her first cycle on September 10, 2007.  She did turn out  to become profoundly neutropenic with an early nadir at 7 days post  treatment.  She then received her cycle #2 of chemotherapy on October 01, 2007.  She received Neulasta injection on day #2 due to the significant  neutropenia following cycle 1.   She states that she started to feel feverish with chills last evening.  She measured her temperature and was at 99.7.  She states that she  continued to feel poorly with mild headache, chills, and vague chest  discomfort.  She measured her fever around 6 p.m. and was found to be  100.5 degrees Fahrenheit.  A repeat fever measurement in about 20  minutes, her fever continued to be elevated at 100.7   In the emergency department, her total WBC count was at 0.5 with no  neutrophils, hemoglobin 9.6, and platelet count 253.  She stated that  she denied any blurry vision, chest pain, cough with sputum, diarrhea,  constipation, or any other problems.  She does give a positive history  of contact with her grandchildren who supposedly had Strep throat a few  days before.  Blood cultures were drawn and she was started on IV  antibiotics and was admitted to the inpatient  Oncology service.   PAST MEDICAL HISTORY:  1. Hypertension.  2. History of breast cancer as mentioned above.  3. History of fallopian tube cancer as mentioned above.  4. History of severe afebrile neutropenia during the first cycle of      Carbo/Taxol.   PAST SURGICAL HISTORY:  1. Total abdominal hysterectomy with bilateral salpingo-oophorectomy.  2. Lumpectomy.  3. Tubal ligation.  4. Three spontaneous vaginal deliveries.   ALLERGIES:  CODEINE.   MEDICATIONS:  1. Diovan 325/25 one tablet p.o. daily.  2. Amlodipine 10 mg p.o. daily.  3. Zofran 8 mg p.o. p.r.n. nausea.  4. Levaquin 500  mg p.o. daily.  5. Coumadin 1 mg p.o. daily.  6. Compazine 10 mg p.o. q.8 hours p.r.n. nausea.   SOCIAL HISTORY:  She used to be a smoker and smoked about half a pack a  day for the last 36 years.  She has quit smoking since her cancer  diagnosis.  She denied any alcohol or illicit drug use.  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   FAMILY HISTORY:  She does have a positive history of breast cancer in  her daughter at the age of 64.  Her mother had breast cancer at the age  of 40 and ovarian cancer at the age of 40.  She had one sister.  Her  daughter did test positive as a BRCA carrier.   PHYSICAL EXAMINATION:  VITAL SIGNS:  Temperature 99.7, pulse 123, blood  pressure 121/81, respiratory rate 18.  GENERAL:  Abigail [name] is an African American female in her 108s who is  resting well in no acute distress.  HEENT:  Pupils equal, round, and reactive to light.  Extraocular  movements are intact.  NECK:   Supple with no rigidity.  No clearly palpable lymphadenopathy in  the supraclavicular and subclavicular cervical region.  CHEST:  Bibasilar mild crackles noted.  BREASTS:  Clear to auscultation.  CARDIOVASCULAR:  S1 and S2 regular.  Tachycardic.  No murmurs, rubs, or  gallops.  ABDOMEN:  Mildly tender in the left lower quadrant.  No clearly palpable  hepatosplenomegaly.  EXTREMITIES:  No cyanosis, clubbing, or edema.  NEUROLOGY:  No obvious focal changes.  Cranial nerves II-XII grossly  intact.   LABORATORY DATA:  WBC 0.5, ANC 0, hemoglobin 9.6, hematocrit 28.1,  platelets 253.  Sodium 125, potassium 3.5, chloride 90, CO2 26, glucose  108, BUN 16, creatinine 0.93, AST 32, ALT 24, total protein 7.1, calcium  8.9.  Blood cultures pending at the time of dictation.   Chest x-ray and KUB pending at the time of this dictation.   IMPRESSION:  1. Neutropenic fever.  2. Hyponatremia.  3. Tachycardia.  4. Left lower quadrant abdominal pain.  5. 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 We will admit to the inpatient service with telemetry.  2. We will obtain blood cultures and continue to follow pancultures      and continue to follow blood counts.  3. Chest x-ray and KUB.  4. We will start cefepime 2 grams q.8 hours IV.  We will obtain urine      osmolality, serum osmolality, urine lytes.  5. IV fluids, normal saline at [facility].  We will continue the rest of her      antihypertensive medications and antinausea medicines.  6. DVT prophylaxis with SCDs and GI prophylaxis with Protonix.  7. Follow fever curve with q.4 hours temperature checks.  8. We will also obtain influenzae swab and rapid Strep screen.  9. Troponin x1.  10.Low salt diet.      Raphael Gibney, MD  Electronically Signed     ______________________________  Genene Churn. Cyndie Chime, M.D.    HV/MEDQ  D:  10/06/2007  T:  10/07/2007  Job:  [id number]

## 2010-06-20 NOTE — Op Note (Signed)
Abigail [name], Abigail [name]            ACCOUNT NO.:  [id number]   MEDICAL RECORD NO.:  [id number]          PATIENT TYPE:  INP   LOCATION:  NA                           [facility]  [facility]   PHYSICIAN:  Paola A. 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   POSTOPERATIVE DIAGNOSIS:  3C optimally debulked ovarian carcinoma.   PROCEDURE:  Total abdominal hysterectomy, bilateral salpingo-  oophorectomy, omentectomy, peritoneal resection, optimal tumor  debulking.   SURGEONS:  Paola A. Duard Brady, MD, Roseanna Rainbow, M.D.   ASSISTANT:  Telford Nab, R.N.   ANESTHESIA:  General.   ANESTHESIOLOGIST:  Jenelle Mages. Fortune, M.D.   SPECIMENS:  Omentum, uterus, cervix, bilateral tubes and ovaries,  peritoneal tumor to pathology.   DISPOSITION OF SPECIMENS:  To pathology.   ESTIMATED BLOOD LOSS:  Less than 100 mL.   IV FLUIDS:  2700 mL.   URINE OUTPUT:  200 mL.   COMPLICATIONS:  None.   OPERATIVE FINDINGS:  Included a 4-cm omental nodule, a left-sided side  wall tumor to the lateral portion of the descending colon measuring 4  cm, a 4 cm left ovary, otherwise normal adnexa.  The uterus, there was  no miliary disease.  There is no palpable adenopathy.   DESCRIPTION OF PROCEDURE:  The patient was taken to operating room,  placed in supine position where general anesthesia was induced.  She was  then placed in the dorsal lithotomy position with appropriate  precautions and SCDs.  The abdomen was prepped in the usual sterile  fashion.  The perineum and vagina were prepped.  Foley catheter was  inserted into the bladder under sterile conditions.  The patient was  then draped.  Time-out was performed to confirm surgeons, procedures,  antibiotic status, allergies.  A vertical infraumbilical skin incision  was made in the  midline and carried down to the underlying fascia using  Bovie cautery.  The fascia was identified, scored, and the fascial  incision was extended superiorly and inferiorly.  The rectus bellies  were dissected off the overlying fascia.  The peritoneal cavity was  entered using sharp dissection.  The fascial and peritoneal incision was  extended superiorly and inferiorly.  Our attention was first drawn to  the abdominal survey with findings as above.  The omentum was brought  out through the midline incision.  An infracolic omentectomy was  performed in the usual fashion.  First in the infracolic, the  attachments of the omentum to the transverse colon were taken down with  Bovie cautery.  The lesser sac was entered.  There was no palpable  disease within the lesser sac.  The pancreas was palpably normal.  Pedicles of the omentum were created, clamped with Kelly's, transected  and suture ligated, using a 0 Vicryl.  This was performed along the  entire infracolic portion of the omentum and the nodule was removed in  its entirety.  Bookwalter self-retaining retractor was then placed.  The  small bowel was run from the ileocecal junction to the ligament of  Treitz and there were no palpable masses or nodularity.  Similarly, the  colon was palpated with no obvious lesions other than the left sidewall  lesion mentioned above.  Bowel was packed in the usual fashion using  appropriate precautions.  Our attention was first on the left pelvic  sidewall.  The retroperitoneal space was opened.  The nodule measuring 4  cm on the peritoneal surface was dissected free from the surrounding  peritoneum using Bovie cautery.  This lesion was noted to be adjacent to  but not adherent to the descending colon and was delivered without  difficulty.  Our attention was then drawn to performing the hysterectomy  and bilateral salpingo-oophorectomy.   The round ligament on the patient's right side was transected  with Bovie  cautery.  The anterior and the posterior broad ligament were opened.  The ureter was identified.  A window was made between the IP and the  ureter.  The IP was clamped x2, transected and ligated with 0 Vicryl.  The uterine artery was then skeletonized and bladder flap was created.  Using curved Mastersons, the uterine artery was clamped, transected and  ligated with 0 Vicryl.  Similar procedure was performed on the patient's  left side.  We then continued down the sides of the cervix using curved  Masterson clamps until we reached the cervicovaginal junction.  Each  pedicle was created, transected, suture ligated with 0 Vicryl.  We then  came across the cervicovaginal junction with curved Mastersons.  The  cervix was amputated and handed off for final pathology.  The vaginal  cuff was closed using a figure-of-eight suture of 0 Vicryl in successive  sutures.  All pedicles were noted to be hemostatic.  The sutures were  cut.  The abdomen and pelvis was copiously irrigated.  Our attention was  then drawn to the omentum.  It was noted to be hemostatic.  There was a  small portion omentum that had not been removed with the first section.  Therefore at this point, the bowel laps were removed.  The omentum was  delivered again through the abdominal wall.  The small portion of  omentum that was not noted previously was removed by creating pedicles,  clamping them with Kelly clamps, transecting them and suture ligating  with 0 Vicryl.  All pedicles were noted be hemostatic.  The Bookwalter  was removed from the patient.  The fascia was closed using running #1  PDS mass closure.  The subcu tissues were irrigated.  Staples were  closed for the skin.   The patient tolerated procedure well and was taken to the recovery room  in stable condition.  All instrument, needle and laparotomy counts were  correct x2.      Paola A. Duard Brady, MD  Electronically Signed     PAG/MEDQ  D:   08/12/2007  T:  08/12/2007  Job:  [id number]   cc:   Pierce Crane, MD  Fax: [phone]   Telford Nab, R.N.  [address]  [place], [place] [address]   Sharlot Gowda, M.D.  Fax: [phone]

## 2010-06-20 NOTE — Op Note (Signed)
Abigail [name], HAUSMANN            ACCOUNT NO.:  [id number]   MEDICAL RECORD NO.:  [id number]          PATIENT TYPE:  AMB   LOCATION:  DSC                          FACILITY:  [facility]   PHYSICIAN:  Thomas A. Cornett, M.D.DATE OF BIRTH:  November 21, 1955   DATE OF PROCEDURE:  DATE OF DISCHARGE:                               OPERATIVE REPORT   PREOPERATIVE DIAGNOSIS:  Left breast cancer.   POSTOPERATIVE DIAGNOSIS:  Left breast cancer.   PROCEDURE:  1. Left breast needle-localized lumpectomy.  2. Left axillary sentinel lymph node mapping with injection of      methylene blue dye.   SURGEON:  Maisie Fus A. Cornett, M.D.   ANESTHESIA:  LMA, 0.25% Sensorcaine with epinephrine.   ESTIMATED BLOOD LOSS:  30 mL.   SPECIMEN:  1. Left breast mass with localizing wire and clip verified by      radiography.  2. One left axillary sentinel lymph node negative by touch prep      secondary tissue from left axilla was sent with increased activity.      No obvious node found on gross pathology.  Will be submitted for      permanent.   DRAINS:  None.   INDICATIONS FOR PROCEDURE:  The patient is a 55 year old female found a  left breast cancer by core biopsy.  She wished to conserve her breast  and presents today for breast conservation surgery and sentinel lymph  node mapping.   DESCRIPTION OF PROCEDURE:  The patient was brought to the operating  room.  She underwent preop wire localization of the left breast and  injection with technetium sulfur colloid by the radiology technician.  After bringing her to the operating room, LMA anesthesia was initiated.  Left breast and axilla were prepped and draped in sterile fashion which  was correct side.  The sentinel node was done first.  NeoProbe was used.  We injected 4 mL methylene blue dye in a subareolar position under the  left nipple.  After waiting 5 minutes using NeoProbe, we identified a  hot spot on the left axilla.  Incision was made below the  hairline of  left axilla of about 3 cm.  Dissection was carried all the way down  until a blue hot node was identified and excised.  There is some  secondary tissue adjacent to it which had significant increased  activity.  I excised this as well.  There was no evidence of any further  activity in the left axilla using NeoProbe and these were sent to  pathology.  There was one node and touch preps which were negative to  secondary area of tissue.  No obvious node was seen.  It was going to be  submitted for permanent sections.  Hemostasis was excellent.   Next, lumpectomy was performed.  A transverse incision was made in the  left medial breast where the mass was located and where the wires were  as well.  We then opened this area and excised the tissue around the  wire tip.  This was sent for radiography which showed the mass to have  good margins,  clip to be present, entire wire to be intact.  The deep  margin was the muscle.  We took some additional medial and superficial  margin as well.  The superior and inferior margins and lateral margins  appeared grossly uninvolved.  Irrigation was used and suctioned out.  We  then closed both wounds in layers using a deep layer of 3-0 Vicryl and a  4-0 Monocryl subcuticular stitch on both.  After cleaning the wounds,  Dermabond was applied.  All final counts of sponge, needle, and  instruments were found be correct at this portion of the case.  The  patient was then awoken, taken to recovery in satisfactory condition.      Thomas A. Cornett, M.D.  Electronically Signed     TAC/MEDQ  D:  05/28/2007  T:  05/29/2007  Job:  [id number]   cc:   Sharlot Gowda, M.D.

## 2010-06-20 NOTE — Discharge Summary (Signed)
Abigail [name], Abigail [name] NO.:  [id number]   MEDICAL RECORD NO.:  [id number]          PATIENT TYPE:  INP   LOCATION:  1422                         [facility]ACILITY[facility]  [facility]   PHYSICIAN:  Roseanna Rainbow, M.D.DATE OF BIRTH:  [birth year]   DATE OF ADMISSION:  08/12/2007  DATE OF DISCHARGE:  08/18/2007                               DISCHARGE SUMMARY   CHIEF COMPLAINT:  The patient is a 55 year old with a newly diagnosed  breast cancer and a primary ovarian or primary peritoneal carcinoma who  presents for operative management.  Please see the dictated history and  physical for further details.   HOSPITAL COURSE:  The patient was admitted.  She underwent total  abdominal hysterectomy, bilateral salpingo-oophorectomy, omentectomy,  peritoneal biopsies and optimal tumor debulking.  Please see the  dictated operative summary.  On postoperative day 1, she was found to  have mild hyponatremia with sodium of 127, her hemoglobin was 10.7.  Her  IV fluids were changed to normal saline and her diet was advanced.  On  postoperative day 2, her sodium was 120 and a hospitalist was consulted.  At this point, her p.o. diuretics were held.  She was subsequently  diagnosed with SIADH and transferred to the step-down unit for  parenteral ADH therapy.  Her sodium was corrected, a TSH was normal and  she was tolerating a regular diet.  She was then discharged to home on  August 18, 2007.   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   PROCEDURES:  Total abdominal hysterectomy, bilateral salpingo-  oophorectomy, omentectomy, peritoneal biopsies, optimal tumor debulking.   CONDITION:  Stable.   DIET:  Regular.   ACTIVITY:  Pelvic rest, progressive activity.   MEDICATIONS:  Please see the medication reconciliation form.   DISPOSITION:  The patient was to follow up in the GYN oncology office at  the [facility].      Roseanna Rainbow, M.D.  Electronically Signed     LAJ/MEDQ  D:  09/16/2007  T:  09/16/2007  Job:  [id number]   cc:   Pierce Crane, MD  Fax: [phone]   Telford Nab, R.N.  [address]  [place], [place] [address]   Sharlot Gowda, M.D.  Fax: [phone]

## 2010-06-20 NOTE — Discharge Summary (Signed)
Abigail [name], Abigail [name] NO.:  [id number]   MEDICAL RECORD NO.:  [id number]          PATIENT TYPE:  INP   LOCATION:  1307                         FACILITY:  [facility]   PHYSICIAN:  Pierce Crane, MD        DATE OF BIRTH:  July 11, 1955   DATE OF ADMISSION:  10/06/2007  DATE OF DISCHARGE:  10/10/2007                               DISCHARGE SUMMARY   DISCHARGE DIAGNOSIS:  Febrile neutropenia.   This is a 55 year old woman with a well known history of breast cancer,  as well as ovarian cancer, stage 3C, status post cycle two of  carboplatin and Taxol.  She presented with low-grade fevers and  neutropenia and was admitted to the hospital for further management.  The history and physical details are present in the patient's chart.  On  admission to the hospital, it was notable that her temperature was 99.7,  her white count was 0.5, ANC 0, sodium was 125.   HOSPITAL COURSE:  The patient was admitted to the hospital and blood  cultures were obtained.  She was placed on cefepime.  She was given IV  fluids.  She basically remained stable while in the hospital.  Her white  count gradually improved.  Her sodium also gradually improved.  On  discharge, sodium was 132, potassium was 4.3, creatinine was 0.81, white  count was 10.9, platelets were 247.  Blood cultures remained negative  while in the hospital.  She had no other complications while in the  hospital.  The patient was discharged in stable condition to be followed  up in the outpatient setting on October 22, 2007, for consideration of  cycle 3 of chemotherapy.  Of note is that she was discharged on her home  medications, including Norvasc 10 mg a day, Diovan 325/25 one daily,  Coumadin 1 mg a day and Compazine p.r.n.  She will be notified about any  follow up visit in the clinic prior to her next cycle of chemotherapy.      Pierce Crane, MD  Electronically Signed     PR/MEDQ  D:  10/10/2007  T:  10/10/2007  Job:   [id number]   cc:   De Blanch, M.D.  [address]  [place]  [place] [address]

## 2010-06-20 NOTE — Consult Note (Signed)
NAMETANDI, HANKO NO.:  [id number]   MEDICAL RECORD NO.:  [id number]          PATIENT TYPE:  OUT   LOCATION:  GYN                          FACILITY:  [facility]   PHYSICIAN:  Abigail A. Duard Brady, MD    DATE OF BIRTH:  1955/06/15   DATE OF CONSULTATION:  07/22/2007  DATE OF DISCHARGE:                                 CONSULTATION   Patient seen today in consultation at the request of Dr. Donnie [name].  [name] is a very pleasant 55 year old gravida 3, para 3 who had a  screening mammogram done in March of 2009.  At that time she herself had  noticed a breast mass for about a month.  Imaging confirmed a mass and a  palpable abnormality.  Biopsy in March of 2009 showed invasive ductal  cancer.  Two separate biopsies showed invasive ductal carcinoma.  Tumor  was ER/PR negative HER-2-0.  She underwent sentinel lymph node  evaluation and lumpectomy May 28, 2007.  The Final pathology reveals a  2-cm tumor.  Final pathology reveals a high-grade invasive ductal  carcinoma with no lymphovascular space involvement, negative margins, 2  sentinel lymph nodes were identified which were negative for malignancy.  Reexcised margins were also negative for tumor.  She did well and had an  unremarkable postoperative course and was last seen by Dr. Donnie [name] on  April 18, 2007.  At that time, imaging was scheduled for prechemotherapy  and pre-adjuvant therapy planning.  She underwent a CT scan of the  chest, abdomen, and pelvis on Jul 02, 2007.  Within the chest.  She had  mild basilar bronchiectasis and postoperative changes with a 5.9 x 3 cm  left breast and left axilla seroma.  She has a small retrocrural lymph  node measuring up to 8 mm.  She had some calcified lymph nodes that  could be consistent with sarcoid.  Within the abdomen, she had  retroperitoneal lymph nodes measuring up to 1.5 cm.  She had omental  implant in the anterior left lower quadrant measuring 3 x 2.7 cm with  smaller  adjacent nodules measuring up to 9 mm.  She had retroperitoneal  lymphadenopathy that could be seen with sarcoid.  However, metastatic  disease remained a consideration.  Within the pelvis, she had a 3.1 x  2.4 cm mass seen along the posterior aspect of the distal descending  colon, which exerted a mild mass effect on the adjacent colon.  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  She was subsequently referred to [place].  She  states that she has had increasing bloating, which has been intermittent  since March or April of 2009 with some early satiety, occasional nausea  and vomiting after eating, which has been present since her breast  cancer diagnosis was made.  She is not sure if this might be related to  her nerves.  She denies any chest pain, shortness of breath, any  significant change in bowel or bladder habits.  She had been menopausal  since the age of 47  or 47.  She has never been on any hormone  replacement therapy.  She denies any vaginal bleeding.  Of note, her  daughter, Abigail [name], was diagnosed with breast cancer at age of 71 and is a  BRCA carrier.   MEDICATIONS:  1. Diovan.  2. Hydrochlorothiazide 320/25 daily.  3. Amlodipine 10 mg daily.   PAST MEDICAL HISTORY:  Hypertension and breast cancer.   ALLERGIES:  CODEINE which causes itching.   PAST SURGICAL HISTORY:  Her breast surgery as described above.  She had  3 spontaneous vaginal deliveries.  She had a tubal ligation.   FAMILY HISTORY:  Her daughter, Abigail [name], had breast cancer at the age of  66.  She has completed her adjuvant therapy and breast reconstruction.  She has had a TAH-BSO.  She has 2 sons, one of them has a daughter.  Her  mother had breast cancer at the age of 44 and ovarian cancer at the age  of 23.  She has one sister who is deceased and another sister who is  alive and well at the age of 35, who and does not have any malignant   condition.   SOCIAL HISTORY:  She quit smoking with her cancer diagnosis.  Prior to  that, she smoked about half a pack per day for 36 years.  She drinks  alcohol occasionally.  She is divorced.  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   PHYSICAL EXAMINATION:  Height 5 feet, weight 195 pounds, blood pressure  117/79, pulse 129.  Well-nourished, well-developed, clearly anxious female in no acute  distress.  NECK:  Supple.  There is no lymphadenopathy, no thyromegaly.  LUNGS:  Clear to auscultation bilaterally.  CARDIOVASCULAR:  Exam is regular rate and rhythm.  ABDOMEN:  Obese.  There is no fluid wave.  Abdomen is soft, nontender,  nondistended.  There is a well-healed infraumbilical incision consistent  with tubal ligation.  There are no palpable masses.  There is no rebound  or guarding.  Groins are negative for adenopathy.  EXTREMITIES:  No edema.  PELVIC:  External genitalia is within normal limits.  The vagina is  atrophic.  The cervix is visualized.  It is multiparous in appearance.  There are no visible lesions or discharge.  Bimanual examination reveals  no masses or nodularity.  The cervix is palpably normal.  The corpus of  normal size, shape, consistency.  RECTAL:  Confirms there is no nodularity.   ASSESSMENT:  A 55 year old with a newly diagnosed breast cancer who  appears to have a primary ovarian or primary peritoneal carcinoma as  well.  Her daughter is a BRCA carrier and I think with this  constellation, the patient most likely is as well.  Her final  disposition for treatment of her breast cancer has not been delineated.  I will speak to Dr. Donnie [name] tomorrow, but if we were only dealing with the  abdominal disease that she has on CT scan, I would recommend proceeding  with exploratory laparotomy, TAH-BSO, and attempted debulking followed  by taxane and platinum based chemotherapy.  I will need to coordinate  this with Dr. Renelda [name] plans.  Alternatively,  she does have multiple  mesenteric implants.  We could always, if deemed appropriate, begin with  the chemotherapy and a neoadjuvant, and delay surgery and do it as a  staged procedure.  Again, I will speak to Dr. Donnie [name] regarding this.  We  will need to get  a CA-125 checked as well.  The patient is amenable to  whatever plan Dr. Donnie [name] and I feel is appropriate.  She would be willing  to proceed with surgery.  Risks and benefits of surgery were discussed  with the patient.  She is well informed regarding the risks of surgery,  though while they are different, they are somewhat  similar to the ones she has recently had for her breast cancer,  including but not limited to injury to surrounding organs, bleeding,  infection.  She understands that we will call her tomorrow after we have  the opportunity to speak with Dr. Donnie [name] and finalize her disposition.  We can call her on her cell phone at [phone].      Abigail A. Duard Brady, MD  Electronically Signed     PAG/MEDQ  D:  07/22/2007  T:  07/22/2007  Job:  [id number]   cc:   Pierce Crane, MD  Fax: [phone]   Telford Nab, R.N.  [address]  [place], [place] [address]   Dr. Pamala Hurry

## 2010-06-20 NOTE — Consult Note (Signed)
Abigail [name], Abigail [name] NO.:  [id number]   MEDICAL RECORD NO.:  [id number]          PATIENT TYPE:  OUT   LOCATION:  GYN                          FACILITY:  [facility]   PHYSICIAN:  De Blanch, M.D.DATE OF BIRTH:  [birth year]   DATE OF CONSULTATION:  DATE OF DISCHARGE:                                 CONSULTATION   CHIEF COMPLAINT:  Ovarian cancer.   INTERVAL HISTORY:  The patient returns today for postoperative checkup,  having undergone a total abdominal hysterectomy, bilateral salpingo-  oophorectomy, omentectomy, and peritoneal resection on August 12, 2007.  She was found to have a stage IIIC ovarian cancer which ultimately was  optimally debulked.  She has now received 2 cycles of carboplatin and  Taxol chemotherapy.  She returns for postoperative followup today.   The patient reports that she has good GI and GU function.  She has no  abdominal pain and her functional status has nearly returned to 100%.   PHYSICAL EXAM:  Weight 192 pounds.  ABDOMEN:  Soft, nontender.  No mass, organomegaly, ascites or hernias  noted.  Midline incision is well-healed.  PELVIC EXAM:  EG, BUS vagina, vulva and urethra are normal.  Cervix and  uterus are surgically absent.  Cuff is well-healed.  Adnexa without  masses.  Rectovaginal exam confirms.   IMPRESSION:  Stage IIIC ovarian cancer, optimally debulked.   The patient will continue receiving carboplatin and Taxol chemotherapy  for a total of 6 cycles.  We would suggest she return to see Dr. Rockney Ghee for further GYN oncology followup in approximately 2 months.   She is given the okay to return to full levels of activity.      De Blanch, M.D.  Electronically Signed     DC/MEDQ  D:  10/03/2007  T:  10/03/2007  Job:  [id number]   cc:   Telford Nab, R.N.  [address]  [place], [place] [address]   Pierce Crane, MD  Fax: [phone]   Sharlot Gowda, M.D.  Fax: [phone]

## 2010-06-20 NOTE — Op Note (Signed)
Abigail [name], GIEBEL            ACCOUNT NO.:  [id number]   MEDICAL RECORD NO.:  [id number]          PATIENT TYPE:  AMB   LOCATION:  ENDO                         [facility]AC[facility]   PHYSICIAN:  Anselmo Rod, M.D.  DATE OF BIRTH:  1955/03/19   DATE OF PROCEDURE:  07/26/2006  DATE OF DISCHARGE:                               OPERATIVE REPORT   PROCEDURE PERFORMED:  Screening colonoscopy with cold biopsies x 3.   ENDOSCOPIST:  Anselmo Rod, M.D.   INSTRUMENT USED:  Pentax video colonoscope.   INDICATIONS FOR PROCEDURE:  55 year old African American female  undergoing screening colonoscopy to rule out colonic polyps, masses,  etc.   PREPROCEDURE PREPARATION:  Informed consent was procured from the  patient.  The patient fasted for 4 hours prior to the procedure and  prepped with 20 of Osmo prep pills the night before and 12 Osmo prep  pills the morning of the procedure.  The risks and benefits of the  procedure including a 10% miss rate of cancer and polyp was discussed  with the patient, as well.   PREPROCEDURE PHYSICAL:  The patient had stable vital signs.  Neck  supple.  Chest clear to auscultation.  S1 and S2 regular.  Abdomen soft  with normal bowel sounds.   DESCRIPTION OF PROCEDURE:  The patient was placed in the left lateral  decubitus position and sedated with 75 mcg of Fentanyl and 8 mg of  Versed given intravenously in slow incremental doses.  Once the patient  was adequately sedated, maintained on low flow oxygen and continuous  cardiac monitoring, the Pentax video colonoscope was advanced from the  rectum to the cecum with difficulty.  There was solid stool in the  dependent areas of the colon.  Multiple washes were done.  A small  sessile polyp was biopsied from 70 cm (cold biopsies x2).  A small  erosion of unclear significance was biopsied from the cecum.  I could  not visualize the terminal ileum in spite of efforts to do so, the prep  was poor and the  patient has an obese abdomen. Gentle abdominal pressure  was applied to reach the cecal base. Internal hemorrhoids were seen on  retroflexion view, they were small and not bleeding at the time of the  examination. There was evidence of pandiverticulosis with diverticula  noted throughout the colon.  The patient tolerated the procedure well  without complications.  Small lesions could have been missed secondary  to relatively poor prep.   IMPRESSION:  1. Pandiverticulosis with diverticula throughout the colon.  2. A small erosion biopsied from the cecum (cold biopsies x1).  3. Small polyp biopsied from 72 cm (cold biopsies x2).  4. Small internal hemorrhoids.  5. Large amount of residual stool in the colon, small lesions could      have been missed.   RECOMMENDATIONS:  1. Await pathology results.  2. Avoid all nonsteroidals for aspirin for the next two weeks.  3. Repeat colonoscopy depending on pathology report.  4. High fiber diet and brochures on diverticulosis have been given to  the patient for education and liberal fluid intake has been      advocated.  5. Outpatient follow-up as need arises in the future.      Anselmo Rod, M.D.  Electronically Signed     JNM/MEDQ  D:  07/26/2006  T:  07/26/2006  Job:  [id number]   cc:   Sharlot Gowda, M.D.  Fax: [phone]

## 2010-06-20 NOTE — Discharge Summary (Signed)
Abigail [name], MATHIA NO.:  [id number]   MEDICAL RECORD NO.:  [id number]          PATIENT TYPE:  INP   LOCATION:  1307                         FACILITY:  [facility]   PHYSICIAN:  Pierce Crane, MD        DATE OF BIRTH:  December 10, 1955   DATE OF ADMISSION:  10/06/2007  DATE OF DISCHARGE:  10/10/2007                               DISCHARGE SUMMARY   DISCHARGE DIAGNOSES:  Febrile neutropenia, history of breast cancer.   This is a 55 year old woman who is diagnosed with stage IIB breast  cancer, who was started on carboplatin and Taxol, who also concurrently  has been diagnosed with stage IIIC ovarian cancer.  She had her  chemotherapy on October 01, 2007 and received Neulasta.  She presented  with a white count of 25 and low grade fever with a temperature of  100.5.   RELEVANT PHYSICAL EXAMINATION:  As seen in the inpatient chart.   COURSE IN THE HOSPITAL:  Patient was admitted to the hospital.  An  admission white count of 0.5, ANC 0, hemoglobin 9.6, platelet count 253.  Sodium 125, creatinine 0.93.  She started on Cefepime but continued on  this.  Intravenous fluids were continued.  She continued to do well.  She essentially defervesced.  Sodium improved.   On October 09, 2007, white count was 2.8, hemoglobin 8.1.  On October 10, 2007, white count was 10.9, hemoglobin 8, platelet count 247.  Creatinine was 0.8, potassium 4.3.   She was felt to be ready for discharge and was discharged with Norvasc  10 mg daily, Diovan 325/25 daily, Coumadin 1 mg a day, Zofran  and  Compazine p.r.n.   She was instructed to follow up with the outpatient clinic.      Pierce Crane, MD  Electronically Signed     PR/MEDQ  D:  11/26/2007  T:  11/26/2007  Job:  [id number]

## 2010-07-04 LAB — HM MAMMOGRAPHY

## 2010-07-26 ENCOUNTER — Ambulatory Visit: Payer: Medicare Other | Attending: Gynecologic Oncology | Admitting: Gynecologic Oncology

## 2010-07-26 ENCOUNTER — Other Ambulatory Visit: Payer: Self-pay | Admitting: Gynecologic Oncology

## 2010-07-26 DIAGNOSIS — C5700 Malignant neoplasm of unspecified fallopian tube: Secondary | ICD-10-CM | POA: Insufficient documentation

## 2010-07-26 DIAGNOSIS — Z9221 Personal history of antineoplastic chemotherapy: Secondary | ICD-10-CM | POA: Insufficient documentation

## 2010-07-26 DIAGNOSIS — Z923 Personal history of irradiation: Secondary | ICD-10-CM | POA: Insufficient documentation

## 2010-07-26 DIAGNOSIS — Z90710 Acquired absence of both cervix and uterus: Secondary | ICD-10-CM | POA: Insufficient documentation

## 2010-07-26 DIAGNOSIS — Z9079 Acquired absence of other genital organ(s): Secondary | ICD-10-CM | POA: Insufficient documentation

## 2010-07-26 DIAGNOSIS — M7989 Other specified soft tissue disorders: Secondary | ICD-10-CM | POA: Insufficient documentation

## 2010-07-26 DIAGNOSIS — C50919 Malignant neoplasm of unspecified site of unspecified female breast: Secondary | ICD-10-CM | POA: Insufficient documentation

## 2010-07-26 DIAGNOSIS — Z1501 Genetic susceptibility to malignant neoplasm of breast: Secondary | ICD-10-CM | POA: Insufficient documentation

## 2010-07-27 ENCOUNTER — Other Ambulatory Visit: Payer: Self-pay | Admitting: Family Medicine

## 2010-07-27 NOTE — Consult Note (Signed)
NAMEDAJON, ROWE NO.:  [id number]  MEDICAL RECORD NO.:  [id number]  LOCATION:  GYN                          FACILITY:  [facility]  PHYSICIAN:  Ritter Helsley A. Duard Brady, MD    DATE OF BIRTH:  January 30, 1956  DATE OF CONSULTATION:  07/26/2010 DATE OF DISCHARGE:                                CONSULTATION   HISTORY OF PRESENT ILLNESS:  Abigail [name] is a very pleasant 55 year old BRCA positive lady with a history of stage 2 breast cancer which was triple negative.  At that time, she underwent lumpectomy, sentinel lymph node evaluation and postoperatively external beam radiation to the left breast.  She was also diagnosed with stage 3C fallopian tube carcinoma in 2009 and underwent TAH-BSO and 6 cycles of paclitaxel and carboplatin.  She had been free of disease from both perspective since the time of diagnoses.  I last saw her in December of 2011 at which time her exam was unremarkable and her CA-125 was 7.8.  She was seen by Dr. Donnie Coffin and had a CA-125 in March of this year and it was 7.3.  She is also up-to-date on her mammograms and had a negative mammogram in May. She is overall doing quite well.  REVIEW OF SYSTEMS:  She does complain of her feet swelling.  She is taking her blood pressure medications.  Her stamina and exercise ability are somewhat limited as she does get short of breath with fairly minimal exertion.  There is no chest pain, nausea, vomiting, radiation of pain or diaphoresis.  She does admit to her weight being a significant issue for her and her BMI is 42.  She denies any abdominal or pelvic pain, any bloating, any nausea, vomiting, fevers, chills, headaches or visual changes.  MEDICATIONS:  Diovan, Nexium, and hydrochlorothiazide.  FAMILY HISTORY:  There are no new medical problems in the family.  HEALTH MAINTENANCE:  She is up-to-date on her mammograms.  PHYSICAL EXAMINATION:  VITAL SIGNS:  Weight 215 pounds, height 5 feet, BMI 42, blood pressure  102/70, respirations 18, pulse 74, temperature 98.2. GENERAL:  Well-nourished, well-nourished female, in no acute distress. NECK:  Supple.  There is no lymphadenopathy, no thyromegaly. LUNGS:  Clear to auscultation bilaterally. CARDIOVASCULAR EXAM:  Regular rate and rhythm. ABDOMEN:  Morbidly obese.  She has a well-healed vertical midline incision.  There is no fluid wave.  There is no obvious abdominal mass or hepatosplenomegaly but exam is limited by habitus and a protuberant abdomen.  Groins are negative for adenopathy. EXTREMITIES:  She has 1+ pedal edema. PELVIC:  External genitalia is within normal limits.  Bimanual examination somewhat limited by habitus.  However, there are no masses or nodularity.  Rectal confirms.  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  PLAN:  We will follow up on results her CA-125 from today.  She has followup with Dr. Donnie Coffin in September and will return to see [place] in December.  Weight loss strategies were discussed with the patient and encouraged.     Abigail [name]  Abigail Covey, MD     PAG/MEDQ  D:  07/26/2010  T:  07/26/2010  Job:  [id number]  JY:NWGNF Aundria Rud RN   Sharlot Gowda, M.D. Fax: [phone]  Radene Gunning, M.D., Ph.D. Fax: [phone]  Pierce Crane, M.D., F.R.C.P.C. Fax: [phone]  Electronically Signed by Cleda Mccreedy MD on 07/27/2010 02:44:15 PM

## 2010-08-07 ENCOUNTER — Encounter: Payer: Self-pay | Admitting: Family Medicine

## 2010-09-22 ENCOUNTER — Ambulatory Visit
Admission: RE | Admit: 2010-09-22 | Discharge: 2010-09-22 | Disposition: A | Discharge status: Home / Self Care | Payer: Medicare Other | Source: Ambulatory Visit | Attending: Family Medicine | Admitting: Family Medicine

## 2010-09-22 ENCOUNTER — Ambulatory Visit (INDEPENDENT_AMBULATORY_CARE_PROVIDER_SITE_OTHER): Payer: Medicare Other | Admitting: Family Medicine

## 2010-09-22 ENCOUNTER — Encounter: Payer: Self-pay | Admitting: Family Medicine

## 2010-09-22 VITALS — BP 126/80 | HR 120 | Wt 217.0 lb

## 2010-09-22 DIAGNOSIS — M549 Dorsalgia, unspecified: Secondary | ICD-10-CM

## 2010-09-22 NOTE — Progress Notes (Signed)
  Subjective:    Patient ID: Abigail [name], female    DOB: 24-Jun-1955, 55 y.o.   MRN: [id number]  HPI About a week ago she noted a cramping sensation in the right flank area. That pain went away however the next day she noted some mid back pain tends to come and go. When she gets the back pain it makes her cough and she also notes a tingling and numb sensation over the dorsum of both feet but no weakness, bowel or bladder trouble. The pain can stay; motion makes the pain worse but breathing does not.  Review of Systems     Objective:   Physical Exam Alert and in no distress. Lungs are clear to auscultation. No tenderness to palpation over her thoracic spine area. Slight pain on motion of her back.       Assessment & Plan:  Mid back pain, etiology unclear I will send her for T-spine x-rays

## 2010-09-25 ENCOUNTER — Telehealth: Payer: Self-pay

## 2010-09-25 NOTE — Telephone Encounter (Signed)
Pt informed of xray

## 2010-10-10 ENCOUNTER — Other Ambulatory Visit: Payer: Self-pay | Admitting: Oncology

## 2010-10-10 ENCOUNTER — Encounter (HOSPITAL_BASED_OUTPATIENT_CLINIC_OR_DEPARTMENT_OTHER): Payer: Medicare Other | Admitting: Oncology

## 2010-10-10 DIAGNOSIS — M818 Other osteoporosis without current pathological fracture: Secondary | ICD-10-CM

## 2010-10-10 DIAGNOSIS — D649 Anemia, unspecified: Secondary | ICD-10-CM

## 2010-10-10 DIAGNOSIS — C5700 Malignant neoplasm of unspecified fallopian tube: Secondary | ICD-10-CM

## 2010-10-10 DIAGNOSIS — C50219 Malignant neoplasm of upper-inner quadrant of unspecified female breast: Secondary | ICD-10-CM

## 2010-10-10 LAB — COMPREHENSIVE METABOLIC PANEL
AST: 68 U/L — ABNORMAL HIGH (ref 0–37)
Albumin: 3.7 g/dL (ref 3.5–5.2)
Alkaline Phosphatase: 99 U/L (ref 39–117)
Calcium: 9.2 mg/dL (ref 8.4–10.5)
Chloride: 103 mEq/L (ref 96–112)
Glucose, Bld: 95 mg/dL (ref 70–99)
Potassium: 3.9 mEq/L (ref 3.5–5.3)
Sodium: 140 mEq/L (ref 135–145)
Total Protein: 7.4 g/dL (ref 6.0–8.3)

## 2010-10-10 LAB — CBC WITH DIFFERENTIAL/PLATELET
Basophils Absolute: 0 10*3/uL (ref 0.0–0.1)
Eosinophils Absolute: 0.1 10*3/uL (ref 0.0–0.5)
HGB: 11.8 g/dL (ref 11.6–15.9)
MCV: 100 fL (ref 79.5–101.0)
MONO%: 15.8 % — ABNORMAL HIGH (ref 0.0–14.0)
NEUT#: 2.3 10*3/uL (ref 1.5–6.5)
RBC: 3.46 10*6/uL — ABNORMAL LOW (ref 3.70–5.45)
RDW: 15.5 % — ABNORMAL HIGH (ref 11.2–14.5)
WBC: 4.1 10*3/uL (ref 3.9–10.3)
lymph#: 1 10*3/uL (ref 0.9–3.3)

## 2010-10-10 LAB — CA 125: CA 125: 11.5 U/mL (ref 0.0–30.2)

## 2010-10-17 ENCOUNTER — Encounter: Payer: Medicare Other | Admitting: Oncology

## 2010-10-17 ENCOUNTER — Other Ambulatory Visit: Payer: Self-pay | Admitting: Oncology

## 2010-10-17 DIAGNOSIS — C50919 Malignant neoplasm of unspecified site of unspecified female breast: Secondary | ICD-10-CM

## 2010-10-24 ENCOUNTER — Ambulatory Visit (HOSPITAL_COMMUNITY)
Admission: RE | Admit: 2010-10-24 | Discharge: 2010-10-24 | Disposition: A | Discharge status: Home / Self Care | Payer: Medicare Other | Source: Ambulatory Visit | Attending: Oncology | Admitting: Oncology

## 2010-10-24 ENCOUNTER — Other Ambulatory Visit: Payer: Self-pay | Admitting: Family Medicine

## 2010-10-24 ENCOUNTER — Encounter (HOSPITAL_COMMUNITY): Payer: Self-pay

## 2010-10-24 DIAGNOSIS — K5730 Diverticulosis of large intestine without perforation or abscess without bleeding: Secondary | ICD-10-CM | POA: Insufficient documentation

## 2010-10-24 DIAGNOSIS — C50919 Malignant neoplasm of unspecified site of unspecified female breast: Secondary | ICD-10-CM | POA: Insufficient documentation

## 2010-10-24 DIAGNOSIS — J984 Other disorders of lung: Secondary | ICD-10-CM | POA: Insufficient documentation

## 2010-10-24 DIAGNOSIS — R599 Enlarged lymph nodes, unspecified: Secondary | ICD-10-CM | POA: Insufficient documentation

## 2010-10-24 MED ORDER — IOHEXOL 300 MG/ML  SOLN
100.0000 mL | Freq: Once | INTRAMUSCULAR | Status: AC | PRN
  Administered 2010-10-24: 100 mL via INTRAVENOUS

## 2010-10-31 LAB — DIFFERENTIAL
Eosinophils Absolute: 0.1
Lymphs Abs: 0.9
Monocytes Relative: 12
Neutro Abs: 2.8
Neutrophils Relative %: 65

## 2010-10-31 LAB — CBC
MCV: 97.7
RBC: 3.73 — ABNORMAL LOW
WBC: 4.3

## 2010-10-31 LAB — BASIC METABOLIC PANEL
Chloride: 100
Creatinine, Ser: 0.86
GFR calc Af Amer: >60
Sodium: 134 — ABNORMAL LOW

## 2010-11-02 LAB — BASIC METABOLIC PANEL
BUN: 5 — ABNORMAL LOW
BUN: 6
BUN: 6
BUN: 9
CO2: 26
CO2: 29
Calcium: 8.8
Calcium: 8.9
Calcium: 9
Calcium: 9.1
Calcium: 9.2
Creatinine, Ser: 0.74
Creatinine, Ser: 0.74
GFR calc Af Amer: >60
GFR calc Af Amer: >60
GFR calc non Af Amer: >60
GFR calc non Af Amer: >60
GFR calc non Af Amer: >60
GFR calc non Af Amer: >60
GFR calc non Af Amer: >60
GFR calc non Af Amer: >60
GFR calc non Af Amer: >60
Glucose, Bld: 106 — ABNORMAL HIGH
Glucose, Bld: 111 — ABNORMAL HIGH
Glucose, Bld: 143 — ABNORMAL HIGH
Potassium: 3.4 — ABNORMAL LOW
Potassium: 3.4 — ABNORMAL LOW
Potassium: 3.6
Sodium: 120 — ABNORMAL LOW
Sodium: 120 — ABNORMAL LOW
Sodium: 131 — ABNORMAL LOW

## 2010-11-02 LAB — CORTISOL-PM, BLOOD: Cortisol - PM: 12.8

## 2010-11-02 LAB — DIFFERENTIAL
Lymphocytes Relative: 14
Lymphs Abs: 0.5 — ABNORMAL LOW
Monocytes Relative: 14 — ABNORMAL HIGH
Neutro Abs: 2.4
Neutrophils Relative %: 66

## 2010-11-02 LAB — CBC
HCT: 31.7 — ABNORMAL LOW
Hemoglobin: 13.3
Hemoglobin: 12.7
MCHC: 34.3
MCHC: 33.9
MCHC: 34
MCV: 97.7
MCV: 97.3
Platelets: 233
RBC: 3.97
RBC: 3.18 — ABNORMAL LOW
RBC: 3.83 — ABNORMAL LOW
RDW: 13.7
RDW: 14.4
WBC: 8.2

## 2010-11-02 LAB — COMPREHENSIVE METABOLIC PANEL
CO2: 27
Calcium: 10
Creatinine, Ser: 0.8
GFR calc non Af Amer: >60
Glucose, Bld: 100 — ABNORMAL HIGH
Total Protein: 7.7

## 2010-11-02 LAB — OSMOLALITY: Osmolality: 241 — ABNORMAL LOW

## 2010-11-02 LAB — TYPE AND SCREEN
ABO/RH(D): O POS
Antibody Screen: NEGATIVE

## 2010-11-02 LAB — ABO/RH: ABO/RH(D): O POS

## 2010-11-02 LAB — SODIUM, URINE, RANDOM: Sodium, Ur: 117

## 2010-11-02 LAB — SODIUM: Sodium: 128 — ABNORMAL LOW

## 2010-11-03 LAB — CBC
HCT: 35.5 — ABNORMAL LOW
Platelets: 315
WBC: 4.4

## 2010-11-06 LAB — CROSSMATCH: Antibody Screen: NEGATIVE

## 2010-11-07 LAB — CROSSMATCH
ABO/RH(D): O POS
Antibody Screen: NEGATIVE

## 2010-11-08 ENCOUNTER — Ambulatory Visit (INDEPENDENT_AMBULATORY_CARE_PROVIDER_SITE_OTHER): Payer: Medicare Other | Admitting: Medical

## 2010-11-08 ENCOUNTER — Encounter: Payer: Self-pay | Admitting: Medical

## 2010-11-08 DIAGNOSIS — R609 Edema, unspecified: Secondary | ICD-10-CM

## 2010-11-08 DIAGNOSIS — R0682 Tachypnea, not elsewhere classified: Secondary | ICD-10-CM

## 2010-11-08 DIAGNOSIS — R0609 Other forms of dyspnea: Secondary | ICD-10-CM

## 2010-11-08 DIAGNOSIS — R0989 Other specified symptoms and signs involving the circulatory and respiratory systems: Secondary | ICD-10-CM

## 2010-11-08 DIAGNOSIS — R600 Localized edema: Secondary | ICD-10-CM

## 2010-11-08 DIAGNOSIS — R000 Tachycardia, unspecified: Secondary | ICD-10-CM

## 2010-11-08 DIAGNOSIS — R635 Abnormal weight gain: Secondary | ICD-10-CM

## 2010-11-08 LAB — CBC
HCT: 22.7 — ABNORMAL LOW
HCT: 24.5 — ABNORMAL LOW
Hemoglobin: 8 — ABNORMAL LOW
Hemoglobin: 8.1 — ABNORMAL LOW
Hemoglobin: 8.3 — ABNORMAL LOW
MCV: 99
MCV: 99.6
RBC: 2.29 — ABNORMAL LOW
RBC: 2.32 — ABNORMAL LOW
RBC: 2.53 — ABNORMAL LOW
RDW: 15
WBC: 1 — CL
WBC: 10.9 — ABNORMAL HIGH
WBC: 2.8 — ABNORMAL LOW

## 2010-11-08 LAB — DIFFERENTIAL
Basophils Relative: 0
Eosinophils Absolute: 0
Eosinophils Relative: 0
Eosinophils Relative: 0
Eosinophils Relative: 2
Lymphocytes Relative: 32
Lymphs Abs: 0.7
Monocytes Relative: 14 — ABNORMAL HIGH
Monocytes Relative: 56 — ABNORMAL HIGH
Monocytes Relative: 8
Neutro Abs: 8.7 — ABNORMAL HIGH
Neutrophils Relative %: 10 — ABNORMAL LOW
Neutrophils Relative %: 51
nRBC: 0

## 2010-11-08 LAB — BASIC METABOLIC PANEL
Calcium: 8.5
Chloride: 103
Chloride: 96
Creatinine, Ser: 0.81
GFR calc Af Amer: >60
GFR calc Af Amer: >60
GFR calc Af Amer: >60
GFR calc non Af Amer: >60
Potassium: 3.3 — ABNORMAL LOW
Potassium: 4.3
Sodium: 125 — ABNORMAL LOW
Sodium: 127 — ABNORMAL LOW

## 2010-11-08 LAB — URINALYSIS, ROUTINE W REFLEX MICROSCOPIC
Glucose, UA: NEGATIVE
Hgb urine dipstick: NEGATIVE
Ketones, ur: NEGATIVE
Protein, ur: NEGATIVE

## 2010-11-08 LAB — OSMOLALITY: Osmolality: 262 — ABNORMAL LOW

## 2010-11-08 NOTE — Progress Notes (Signed)
  Subjective:   HPI  Abigail [name] is a 55 y.o. female who presents for left ankle and foot swelling.  She notes that she gets swelling periodically in the past, but worse in the last 2 weeks.  When her left foot swells up her toes feel numb, but this goes away when swelling is down.  She notes that she is disabled, and she sits on the side of her bed for long periods of time reading, and wonders if this is contributing to the swelling.  She is on her feet for long periods of time as well around the house.  She is using nothing for the symptoms. No other aggravating or relieving factors.    She does have a history significant for fallopian tube cancer in 2009, had total hysterectomy, and is seeing gynecology every 6 months. Sees [facility].  She denies history of kidney failure, no history of heart disease, but Dr. Susann Givens has been working with her for tachycardia. 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  She has been advised to lose weight, but she has had a hard time doing this.  No other c/o.  The following portions of the patient's history were reviewed and updated as appropriate: allergies, current medications, past family history, past medical history, past social history, past surgical history and problem list.  Past Medical History  Diagnosis Date  . Hypertension   . Obesity   . Diverticulitis   . Hemorrhoids   . Colonic polyp   . GERD (gastroesophageal reflux disease)   . Cancer     BREAST, FALLOPIAN TUBE,    Review of Systems Constitutional: denies fever, chills, sweats Dermatology: denies rash Cardiology: denies chest pain, palpitations Respiratory: denies cough, shortness of breath, wheezing Gastroenterology: denies abdominal pain, nausea, vomiting, diarrhea, constipation Hematology: denies bleeding or bruising problems Musculoskeletal: +back pain, joint pains, multiple Neurology: no headache, weakness     Objective:   Physical Exam  General  appearance: alert, no distress, WD/WN, obese black female Neck: supple, no lymphadenopathy, no thyromegaly, no masses Heart: tachycardia at 120 bpm, otherwise RRR, normal S1, S2, no murmurs Lungs: respiratory rate 20, but otherwise CTA bilaterally, no wheezes, rhonchi, or rales Abdomen: +bs, soft, non tender, non distended, no masses, no hepatomegaly, no splenomegaly Musculoskeletal: bilat legs, ankle, and feet nontender, no obvious deformity Extremities: mild 1+ pedal edema on the left, otherwise no edema, no cyanosis, no clubbing, no calve pain or palpable cord Pulses: 2+ symmetric, upper and lower extremities, normal cap refill Neurological: normal legs strength and sensation, normal DTRs of legs   Assessment :    Encounter Diagnoses  Name Primary?  . Tachycardia   . Tachypnea   . Weight gain   . Edema extremities   . Other dyspnea and respiratory abnormality      Plan:   Reviewed recent CBC, CMP labs from 10/10/10 in the computer per oncology.  Advised that the etiology of the edema may just be dependent edema or physiologic.  I will check BNP and D-dimer for completeness, but I don't suspect any major cause.  The same 1+ pedal edema has been documented prior oncology as well.  She has no other clinical signs of CHF or renal failure, and she had CT chest/abdomen/pelvis recently 10/24/10 with no new masses or PE.  I reviewed this document.

## 2010-11-09 ENCOUNTER — Other Ambulatory Visit: Payer: Self-pay | Admitting: Medical

## 2010-11-09 DIAGNOSIS — R600 Localized edema: Secondary | ICD-10-CM

## 2010-11-09 LAB — BRAIN NATRIURETIC PEPTIDE: Brain Natriuretic Peptide: 3.5 pg/mL (ref 0.0–100.0)

## 2010-11-10 LAB — CROSSMATCH

## 2010-11-14 ENCOUNTER — Telehealth: Payer: Self-pay | Admitting: Family Medicine

## 2010-11-14 ENCOUNTER — Ambulatory Visit
Admission: RE | Admit: 2010-11-14 | Discharge: 2010-11-14 | Disposition: A | Discharge status: Home / Self Care | Payer: Medicare Other | Source: Ambulatory Visit | Attending: Medical | Admitting: Medical

## 2010-11-14 DIAGNOSIS — R600 Localized edema: Secondary | ICD-10-CM

## 2010-11-14 NOTE — Telephone Encounter (Signed)
Patient was notified of her U/S report and she made another appt. To see Dr. Susann Givens. CLS

## 2010-11-14 NOTE — Telephone Encounter (Signed)
Ultrasound was negative for clot.   I think she just has dependent edema.  I would recommend leg elevation when swelling is worse.  I recommend she get some exercise daily such as walking to get the blood moving in her legs.  Avoid sitting on the side of the bed for long periods.  She can wear OTC compression hose or if edema worsens, we can consider prescription ted hose.    Most importantly, I don't think her foot and ankle swelling represents anything serious.

## 2010-11-15 NOTE — Telephone Encounter (Signed)
Done

## 2010-11-16 ENCOUNTER — Ambulatory Visit (INDEPENDENT_AMBULATORY_CARE_PROVIDER_SITE_OTHER): Payer: Medicare Other | Admitting: Family Medicine

## 2010-11-16 VITALS — BP 124/80 | HR 117 | Ht 61.0 in | Wt 222.0 lb

## 2010-11-16 DIAGNOSIS — M549 Dorsalgia, unspecified: Secondary | ICD-10-CM

## 2010-11-16 DIAGNOSIS — Z23 Encounter for immunization: Secondary | ICD-10-CM

## 2010-11-16 NOTE — Progress Notes (Signed)
  Subjective:    Patient ID: Abigail [name], female    DOB: 1955/03/05, 55 y.o.   MRN: [id number]  HPI He is here for consult concerning continued back pain. She is using Aleve twice per day which helps. She still does have difficulty completing her ADL's. She will start mopping or sweeping the floor and have to stop do to pain. She would like help with completing this and would also like someone to help with exercise to help her with her weight loss.   Review of Systems     Objective:   Physical Exam Alert and in no distress otherwise not examined. Review of her x-rays does show some arthritic changes.       Assessment & Plan:  Back pain. Probable deconditioning. I discussed possible referral to physical therapy. We'll set up for her to have a home health assessment to decide on her functional abilities.

## 2010-11-16 NOTE — Patient Instructions (Signed)
You were received a call concerning getting the assessment of your needs at home

## 2010-11-24 ENCOUNTER — Telehealth: Payer: Self-pay | Admitting: Family Medicine

## 2010-11-24 NOTE — Telephone Encounter (Signed)
DONE

## 2010-12-07 ENCOUNTER — Other Ambulatory Visit: Payer: Self-pay | Admitting: Family Medicine

## 2010-12-07 NOTE — Telephone Encounter (Signed)
NOTE IN ERROR, WRONG PT-LM

## 2010-12-07 NOTE — Telephone Encounter (Deleted)
PER JCL LANTUS WAS ERROR,  PT IS ON LEVAMIR USE AS DIRECTED, CALLED TO [facility] # [phone]- JCL PLEASE SIGN OFF ON RX-LM

## 2010-12-14 ENCOUNTER — Telehealth: Payer: Self-pay | Admitting: Family Medicine

## 2010-12-14 NOTE — Telephone Encounter (Signed)
DONE

## 2011-01-23 ENCOUNTER — Encounter: Payer: Self-pay | Admitting: Gynecologic Oncology

## 2011-01-24 ENCOUNTER — Encounter: Payer: Self-pay | Admitting: Gynecologic Oncology

## 2011-01-24 ENCOUNTER — Ambulatory Visit: Payer: Medicare Other | Attending: Gynecologic Oncology | Admitting: Gynecologic Oncology

## 2011-01-24 ENCOUNTER — Ambulatory Visit: Payer: Medicare Other

## 2011-01-24 VITALS — BP 118/60 | HR 66 | Temp 98.5°F | Resp 16 | Ht 61.0 in | Wt 221.3 lb

## 2011-01-24 DIAGNOSIS — Z1501 Genetic susceptibility to malignant neoplasm of breast: Secondary | ICD-10-CM | POA: Insufficient documentation

## 2011-01-24 DIAGNOSIS — Z860100 Personal history of colon polyps, unspecified: Secondary | ICD-10-CM | POA: Insufficient documentation

## 2011-01-24 DIAGNOSIS — C5700 Malignant neoplasm of unspecified fallopian tube: Secondary | ICD-10-CM | POA: Insufficient documentation

## 2011-01-24 DIAGNOSIS — E669 Obesity, unspecified: Secondary | ICD-10-CM | POA: Insufficient documentation

## 2011-01-24 DIAGNOSIS — I10 Essential (primary) hypertension: Secondary | ICD-10-CM | POA: Insufficient documentation

## 2011-01-24 DIAGNOSIS — Z79899 Other long term (current) drug therapy: Secondary | ICD-10-CM | POA: Insufficient documentation

## 2011-01-24 DIAGNOSIS — C50219 Malignant neoplasm of upper-inner quadrant of unspecified female breast: Secondary | ICD-10-CM | POA: Insufficient documentation

## 2011-01-24 DIAGNOSIS — K219 Gastro-esophageal reflux disease without esophagitis: Secondary | ICD-10-CM | POA: Insufficient documentation

## 2011-01-24 LAB — CA 125: CA 125: 52.6 U/mL — ABNORMAL HIGH (ref 0.0–30.2)

## 2011-01-24 NOTE — Progress Notes (Signed)
Consult Note: Gyn-Onc  Abigail [name] 55 y.o. female  CC:  Chief Complaint  Patient presents with  . Follow-up    F. tube ca, BRCA +    HPI: Abigail [name] is a very pleasant 55 y.o. 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 She was also diagnosed with a stage III C. fallopian tube carcinoma in 2009 and underwent appropriate surgery followed by 6 cycles of paclitaxel and carboplatin.  She has been free of disease from the prospective since that time. I last saw her in June of 2012 which time her exam was unremarkable and there's he went to 5 was negative. She is CA 125 September 4 which was 11.5. Dr. Donnie Coffin ordered a CT scan in September which revealed: No evidence of disease progression within the chest. Within the abdomen and pelvis she had essentially stable retroperitoneal para-aortic lymphadenopathy. Right aortocaval node measured 16 mm as compared to 13 mm on prior.  Interval History: She is overall doing quite well. She sees that there is still some occasional tingling in her feet. She says she walks about 10 minutes a day which is an improvement of her being able to walk only about a block previously. She walks every day she does get tired with some shortness of breath but there is no chest pain. When I saw her last year she was having quite above issues with her feet swelling that issue is better. She has gained approximately 6 pounds since June of 2012. She states she does not the right, she skips multiple meals yet declined that she is too much when she does eat. 10 point review of systems is otherwise negative.  She had a negative mammogram in May.  Review of Systems  Current Meds:  Outpatient Encounter Prescriptions as of 01/24/2011  Medication Sig Dispense Refill  . amLODipine (NORVASC) 10 MG tablet TAKE 1 TABLET EVERY  DAY  30 tablet  4  . Cholecalciferol (VITAMIN D3) 3000 UNITS TABS Take by mouth daily.       Marland Kitchen lisinopril-hydrochlorothiazide (PRINZIDE,ZESTORETIC) 10-12.5 MG per tablet TAKE 1 TABLET BY MOUTH DAILY  30 tablet  7    Allergy:  Allergies  Allergen Reactions  . Codeine Itching    Social Hx:   History   Social History  . Marital Status: Divorced    Spouse Name: N/A    Number of Children: N/A  . Years of Education: N/A   Occupational History  . Not on file.   Social History Main Topics  . Smoking status: Former Smoker    Types: Cigarettes    Quit date: 08/07/2006  . Smokeless tobacco: Not on file  . Alcohol Use: 0.0 oz/week     occas  . Drug Use:   . Sexually Active:    Other Topics Concern  . Not on file   Social History Narrative  . No narrative on file    Past Surgical Hx:  Past Surgical History  Procedure Date  . Tonsillectomy and adenoidectomy   . Breast lumpectomy   . Abdominal hysterectomy     TAH, BSO    Past Medical Hx:  Past Medical History  Diagnosis Date  . Hypertension   . Obesity   . Diverticulitis   . Hemorrhoids   . Colonic polyp   . GERD (gastroesophageal reflux disease)   . Cancer  BREAST, FALLOPIAN TUBE,    Family Hx:  Family History  Problem Relation Age of Onset  . Cancer Mother   . Hypertension Mother   . Diabetes Father   . Heart disease Father   . Hypertension Father   . Stroke Father   . Hypertension Sister   . Hypertension Brother   . Hypertension Paternal Aunt   . Diabetes Paternal Aunt   . Hypertension Paternal Uncle   . Diabetes Paternal Uncle   . Hypertension Paternal Grandmother   . Hypertension Paternal Grandfather     Vitals:  Blood pressure 118/60, pulse 66, temperature 98.5 F (36.9 C), resp. rate 16, height 5\' 1"  (1.549 m), weight 221 lb 4.8 oz (100.381 kg).  Physical Exam: Well-nourished well-developed female in no acute distress.  Neck: Supple, no lymphadenopathy no thyromegaly.  Lungs: Clear  to auscultation bilaterally.  Cardiovascular: Regular rate and rhythm.  Abdomen: Obese, soft, nontender, nondistended. There is a well-healed vertical midline incision. There is no evidence of an incisional hernia. There's no palpable masses, exam is limited by habitus.  Groins: No lymphadenopathy.   Extremities: Trace to 1+ edema equal bilaterally.  Pelvic: No masses or nodularity. Rectal confirms.  Assessment/Plan: 55 year old with history of breast cancer BRCA positivity as well as a history of IIIc fallopian tube carcinoma. Clinically she has no evidence of recurrent disease. However, there is a lymph node this 3 mm larger on repeat imaging. We will follow up on her CA 125 from today. If her CA 125 is normal then I would continue to follow expectantly. If the CA 125 is rising she may need to have repeat imaging. She will followup with Dr. Donnie Coffin in 3 months and if all is well return to see [place] in 6 months.  Mika Anastasi A., MD 01/24/2011, 11:27 AM

## 2011-01-24 NOTE — Patient Instructions (Signed)
Return to clinic in 6 months.

## 2011-03-16 ENCOUNTER — Other Ambulatory Visit: Payer: Self-pay | Admitting: Oncology

## 2011-03-16 ENCOUNTER — Telehealth: Payer: Self-pay | Admitting: Oncology

## 2011-03-16 DIAGNOSIS — C50919 Malignant neoplasm of unspecified site of unspecified female breast: Secondary | ICD-10-CM

## 2011-03-16 DIAGNOSIS — Z853 Personal history of malignant neoplasm of breast: Secondary | ICD-10-CM

## 2011-03-16 NOTE — Telephone Encounter (Signed)
called pt and scheduled appts for march2013 and mammogram for 05/30 @ [facility]

## 2011-03-17 ENCOUNTER — Telehealth: Payer: Self-pay | Admitting: Oncology

## 2011-03-17 NOTE — Telephone Encounter (Signed)
called pt informed her of appt for march2013.  and mammogram appt for 05/30 @ 10:30am @ [facility].  pt stated that she claustrophobia and needs a open machine for MRI appt.  Called [place] @ [place] toschedule pt for MRI in [place]

## 2011-04-16 ENCOUNTER — Encounter: Payer: Self-pay | Admitting: Family Medicine

## 2011-04-16 ENCOUNTER — Ambulatory Visit (INDEPENDENT_AMBULATORY_CARE_PROVIDER_SITE_OTHER): Payer: Medicare Other | Admitting: Family Medicine

## 2011-04-16 DIAGNOSIS — R109 Unspecified abdominal pain: Secondary | ICD-10-CM

## 2011-04-16 DIAGNOSIS — R110 Nausea: Secondary | ICD-10-CM

## 2011-04-16 NOTE — Patient Instructions (Signed)
Return here if symptoms continue or get worse.

## 2011-04-16 NOTE — Progress Notes (Signed)
  Subjective:    Patient ID: Abigail [name], female    DOB: 02/01/1956, 56 y.o.   MRN: [id number]  HPI She  has a one-day history of difficulty with abdominal pain and nausea but no vomiting or diarrhea. No urinary symptoms. She did have a BM this morning and feels slightly better. She has not tried any medications. She continues on medications listed in the chart. She does have underlying fallopian tube cancer and breast cancer. She is scheduled to see her oncologist within the next several months for routine followup.   Review of Systems     Objective:   Physical Exam alert and in no distress. Tympanic membranes and canals are normal. Throat is clear. Tonsils are normal. Neck is supple without adenopathy or thyromegaly. Cardiac exam shows a regular sinus rhythm without murmurs or gallops. Lungs are clear to auscultation. Abdominal exam shows active bowel sounds. The abdomen is quite protuberant and therefore difficult to feel any viscera.       Assessment & Plan:   1. Abdominal pain   2. Nausea    discussed watchful waiting with her and treating the symptoms. She will call if continued difficulty.

## 2011-05-04 ENCOUNTER — Ambulatory Visit (HOSPITAL_BASED_OUTPATIENT_CLINIC_OR_DEPARTMENT_OTHER): Payer: Medicare Other | Admitting: Oncology

## 2011-05-04 ENCOUNTER — Other Ambulatory Visit (HOSPITAL_BASED_OUTPATIENT_CLINIC_OR_DEPARTMENT_OTHER): Payer: Medicare Other | Admitting: Lab

## 2011-05-04 VITALS — BP 134/89 | HR 118 | Temp 99.2°F | Ht 61.0 in | Wt 215.0 lb

## 2011-05-04 DIAGNOSIS — C50919 Malignant neoplasm of unspecified site of unspecified female breast: Secondary | ICD-10-CM

## 2011-05-04 DIAGNOSIS — C50219 Malignant neoplasm of upper-inner quadrant of unspecified female breast: Secondary | ICD-10-CM

## 2011-05-04 DIAGNOSIS — Z1501 Genetic susceptibility to malignant neoplasm of breast: Secondary | ICD-10-CM

## 2011-05-04 DIAGNOSIS — M818 Other osteoporosis without current pathological fracture: Secondary | ICD-10-CM

## 2011-05-04 DIAGNOSIS — C5700 Malignant neoplasm of unspecified fallopian tube: Secondary | ICD-10-CM

## 2011-05-04 DIAGNOSIS — D649 Anemia, unspecified: Secondary | ICD-10-CM

## 2011-05-04 LAB — COMPREHENSIVE METABOLIC PANEL
ALT: 18 U/L (ref 0–35)
AST: 34 U/L (ref 0–37)
Albumin: 3.3 g/dL — ABNORMAL LOW (ref 3.5–5.2)
BUN: 7 mg/dL (ref 6–23)
Calcium: 9.7 mg/dL (ref 8.4–10.5)
Chloride: 94 mEq/L — ABNORMAL LOW (ref 96–112)
Potassium: 3.3 mEq/L — ABNORMAL LOW (ref 3.5–5.3)
Sodium: 135 mEq/L (ref 135–145)
Total Protein: 8.2 g/dL (ref 6.0–8.3)

## 2011-05-04 LAB — CBC WITH DIFFERENTIAL/PLATELET
BASO%: 0.3 % (ref 0.0–2.0)
HCT: 38.3 % (ref 34.8–46.6)
MCHC: 33.2 g/dL (ref 31.5–36.0)
MONO#: 0.8 10*3/uL (ref 0.1–0.9)
NEUT%: 71.6 % (ref 38.4–76.8)
RDW: 17.8 % — ABNORMAL HIGH (ref 11.2–14.5)
WBC: 8 10*3/uL (ref 3.9–10.3)
lymph#: 1.3 10*3/uL (ref 0.9–3.3)
nRBC: 0 % (ref 0–0)

## 2011-05-04 NOTE — Progress Notes (Signed)
Hematology and Oncology Follow Up Visit  Abigail [name] [id number] 12/12/1955 56 y.o. 05/04/2011 5:49 PM PCP  Principle Diagnosis: 56 yo woman with   1. 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  Two sentinel lymph nodes negative for metastatic disease.  2. 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 Status post radiation therapy to left breast completed February 2010 3. Hx of BRCA abnormality Interim History:  There have been no intercurrent illness, hospitalizations or medication changes.She has been having some episodes of nausea with no emeisis over the past few weeks , no abdominal pain noted.no weight changes.  Medications: I have reviewed the patient's current medications.  Allergies:  Allergies  Allergen Reactions  . Codeine Itching    Past Medical History, Surgical history, Social history, and Family History were reviewed and updated.  Review of Systems: Constitutional:  Negative for fever, chills, night sweats, anorexia, weight loss, pain. Cardiovascular: negative Respiratory: no cough, shortness of breath, or wheezing Neurological: negative Dermatological: negative ENT: negative Skin Gastrointestinal: nausea, abdominal discomfort Genito-Urinary: negative Hematological and Lymphatic: negative Breast: negative Musculoskeletal: negative Remaining ROS negative.  Physical Exam: Blood pressure 134/89, pulse 118, temperature 99.2 F (37.3 C), temperature source Oral, height 5\' 1"  (1.549 m), weight 214 lb 15.4 oz (97.505 kg). ECOG: 0 General appearance: alert, cooperative and appears stated age Head: Normocephalic, without obvious abnormality, atraumatic Neck: no adenopathy, no carotid bruit, no JVD, supple, symmetrical, trachea midline and thyroid not enlarged, symmetric, no tenderness/mass/nodules Lymph nodes: Cervical,  supraclavicular, and axillary nodes normal. Cardiac : regular rate and rhythm, no murmurs or gallops Pulmonary:clear to auscultation bilaterally and normal percussion bilaterally Breasts: inspection negative, no nipple discharge or bleeding, no masses or nodularity palpable Abdomen:sl distended abdomen , nl bwel sounds, no rebound , questionable ascites. Extremities negative Neuro: alert, oriented, normal speech, no focal findings or movement disorder noted  Lab Results: Lab Results  Component Value Date   WBC 8.0 05/04/2011   HGB 12.7 05/04/2011   HCT 38.3 05/04/2011   MCV 112.1* 05/04/2011   PLT 299 05/04/2011     Chemistry      Component Value Date/Time   NA 140 10/10/2010 0827   NA 140 10/10/2010 0827   NA 140 10/10/2010 0827   K 3.9 10/10/2010 0827   K 3.9 10/10/2010 0827   K 3.9 10/10/2010 0827   CL 103 10/10/2010 0827   CL 103 10/10/2010 0827   CL 103 10/10/2010 0827   CO2 25 10/10/2010 0827   CO2 25 10/10/2010 0827   CO2 25 10/10/2010 0827   BUN 16 10/10/2010 0827   BUN 16 10/10/2010 0827   BUN 16 10/10/2010 0827   CREATININE 0.90 10/10/2010 0827   CREATININE 0.90 10/10/2010 0827   CREATININE 0.90 10/10/2010 0827      Component Value Date/Time   CALCIUM 9.2 10/10/2010 0827   CALCIUM 9.2 10/10/2010 0827   CALCIUM 9.2 10/10/2010 0827   ALKPHOS 99 10/10/2010 0827   ALKPHOS 99 10/10/2010 0827   ALKPHOS 99 10/10/2010 0827   AST 68* 10/10/2010 0827   AST 68* 10/10/2010 0827   AST 68* 10/10/2010 0827   ALT 37* 10/10/2010 0827   ALT 37* 10/10/2010 0827   ALT 37* 10/10/2010 0827   BILITOT 0.4 10/10/2010 0827   BILITOT 0.4 10/10/2010 0827   BILITOT 0.4 10/10/2010 0827      .pathology. Radiological Studies:  chest X-ray n/a Mammogram Due 5/13 Bone density 3/12  Impression and Plan: ca125 from 12/12 was elevated to 53, we will repeat and order scans , I will f/u in a few weeks.  More than 50% of the visit was spent in patient-related counselling   Pierce Crane, MD 3/29/20135:49 PM

## 2011-05-08 ENCOUNTER — Telehealth: Payer: Self-pay | Admitting: Oncology

## 2011-05-08 NOTE — Telephone Encounter (Signed)
gve a copy of the referral to nancy wilkerson/melissa in obgyn dept for an appt with dr Duard Brady. Dr Donnie Coffin is wanting the appt asap.

## 2011-05-08 NOTE — Telephone Encounter (Signed)
S/w [facility]athy mcconnell [facility] regarding the pt needing a mri of the breast. Per Olegario Messier the pt is scheduled for her mammo at [facility] on 07/05/2011 and she will schedule the mri breast appt after the mammo appt is done and will notify the pt

## 2011-05-11 ENCOUNTER — Other Ambulatory Visit: Payer: Self-pay | Admitting: Oncology

## 2011-05-11 ENCOUNTER — Telehealth: Payer: Self-pay | Admitting: *Deleted

## 2011-05-11 DIAGNOSIS — Z1501 Genetic susceptibility to malignant neoplasm of breast: Secondary | ICD-10-CM

## 2011-05-11 NOTE — Telephone Encounter (Signed)
patient confirmed over the phone the new date and time for lab only on 05-14-2011

## 2011-05-14 ENCOUNTER — Other Ambulatory Visit: Payer: Self-pay | Admitting: Oncology

## 2011-05-14 ENCOUNTER — Other Ambulatory Visit: Payer: Medicare Other | Admitting: Lab

## 2011-05-14 DIAGNOSIS — D649 Anemia, unspecified: Secondary | ICD-10-CM

## 2011-05-14 DIAGNOSIS — C50219 Malignant neoplasm of upper-inner quadrant of unspecified female breast: Secondary | ICD-10-CM

## 2011-05-14 DIAGNOSIS — Z1501 Genetic susceptibility to malignant neoplasm of breast: Secondary | ICD-10-CM

## 2011-05-14 DIAGNOSIS — M818 Other osteoporosis without current pathological fracture: Secondary | ICD-10-CM

## 2011-05-14 DIAGNOSIS — C5700 Malignant neoplasm of unspecified fallopian tube: Secondary | ICD-10-CM

## 2011-05-15 ENCOUNTER — Encounter: Payer: Self-pay | Admitting: Oncology

## 2011-05-16 ENCOUNTER — Other Ambulatory Visit: Payer: Self-pay | Admitting: Oncology

## 2011-05-16 ENCOUNTER — Encounter (HOSPITAL_COMMUNITY)
Admission: RE | Admit: 2011-05-16 | Discharge: 2011-05-16 | Disposition: A | Discharge status: Home / Self Care | Payer: Medicare Other | Source: Ambulatory Visit | Attending: Oncology | Admitting: Oncology

## 2011-05-16 DIAGNOSIS — Z1501 Genetic susceptibility to malignant neoplasm of breast: Secondary | ICD-10-CM

## 2011-05-16 DIAGNOSIS — Z853 Personal history of malignant neoplasm of breast: Secondary | ICD-10-CM | POA: Insufficient documentation

## 2011-05-16 DIAGNOSIS — C569 Malignant neoplasm of unspecified ovary: Secondary | ICD-10-CM | POA: Insufficient documentation

## 2011-05-16 DIAGNOSIS — Z9079 Acquired absence of other genital organ(s): Secondary | ICD-10-CM | POA: Insufficient documentation

## 2011-05-16 DIAGNOSIS — R599 Enlarged lymph nodes, unspecified: Secondary | ICD-10-CM | POA: Insufficient documentation

## 2011-05-16 DIAGNOSIS — C5700 Malignant neoplasm of unspecified fallopian tube: Secondary | ICD-10-CM

## 2011-05-16 DIAGNOSIS — Z90710 Acquired absence of both cervix and uterus: Secondary | ICD-10-CM | POA: Insufficient documentation

## 2011-05-16 DIAGNOSIS — C50919 Malignant neoplasm of unspecified site of unspecified female breast: Secondary | ICD-10-CM

## 2011-05-16 DIAGNOSIS — C786 Secondary malignant neoplasm of retroperitoneum and peritoneum: Secondary | ICD-10-CM | POA: Insufficient documentation

## 2011-05-16 MED ORDER — FLUDEOXYGLUCOSE F - 18 (FDG) INJECTION
18.0000 | Freq: Once | INTRAVENOUS | Status: AC | PRN
  Administered 2011-05-16: 18 via INTRAVENOUS

## 2011-05-17 LAB — CELL SEARCH FOR BREAST CANCER

## 2011-05-24 ENCOUNTER — Encounter: Payer: Self-pay | Admitting: Medical Oncology

## 2011-05-24 ENCOUNTER — Telehealth: Payer: Self-pay | Admitting: Oncology

## 2011-05-24 ENCOUNTER — Ambulatory Visit (HOSPITAL_BASED_OUTPATIENT_CLINIC_OR_DEPARTMENT_OTHER): Payer: Medicare Other | Admitting: Oncology

## 2011-05-24 ENCOUNTER — Encounter: Payer: Self-pay | Admitting: Gynecologic Oncology

## 2011-05-24 ENCOUNTER — Ambulatory Visit: Payer: Medicare Other | Attending: Gynecologic Oncology | Admitting: Gynecologic Oncology

## 2011-05-24 ENCOUNTER — Encounter: Payer: Self-pay | Admitting: Oncology

## 2011-05-24 VITALS — BP 124/72 | HR 66 | Temp 98.9°F | Resp 24 | Ht 61.0 in | Wt 211.4 lb

## 2011-05-24 VITALS — BP 118/82 | HR 130 | Temp 98.4°F | Ht 61.0 in | Wt 212.3 lb

## 2011-05-24 DIAGNOSIS — Z1501 Genetic susceptibility to malignant neoplasm of breast: Secondary | ICD-10-CM

## 2011-05-24 DIAGNOSIS — C5700 Malignant neoplasm of unspecified fallopian tube: Secondary | ICD-10-CM

## 2011-05-24 DIAGNOSIS — Z9079 Acquired absence of other genital organ(s): Secondary | ICD-10-CM | POA: Insufficient documentation

## 2011-05-24 DIAGNOSIS — Z923 Personal history of irradiation: Secondary | ICD-10-CM | POA: Insufficient documentation

## 2011-05-24 DIAGNOSIS — Z809 Family history of malignant neoplasm, unspecified: Secondary | ICD-10-CM | POA: Insufficient documentation

## 2011-05-24 DIAGNOSIS — E669 Obesity, unspecified: Secondary | ICD-10-CM | POA: Insufficient documentation

## 2011-05-24 DIAGNOSIS — Z853 Personal history of malignant neoplasm of breast: Secondary | ICD-10-CM | POA: Insufficient documentation

## 2011-05-24 DIAGNOSIS — Z8544 Personal history of malignant neoplasm of other female genital organs: Secondary | ICD-10-CM | POA: Insufficient documentation

## 2011-05-24 DIAGNOSIS — C786 Secondary malignant neoplasm of retroperitoneum and peritoneum: Secondary | ICD-10-CM | POA: Insufficient documentation

## 2011-05-24 DIAGNOSIS — Z79899 Other long term (current) drug therapy: Secondary | ICD-10-CM | POA: Insufficient documentation

## 2011-05-24 DIAGNOSIS — Z87891 Personal history of nicotine dependence: Secondary | ICD-10-CM | POA: Insufficient documentation

## 2011-05-24 DIAGNOSIS — I10 Essential (primary) hypertension: Secondary | ICD-10-CM | POA: Insufficient documentation

## 2011-05-24 DIAGNOSIS — Z90710 Acquired absence of both cervix and uterus: Secondary | ICD-10-CM | POA: Insufficient documentation

## 2011-05-24 DIAGNOSIS — K219 Gastro-esophageal reflux disease without esophagitis: Secondary | ICD-10-CM | POA: Insufficient documentation

## 2011-05-24 MED ORDER — DEXAMETHASONE 4 MG PO TABS: ORAL_TABLET | ORAL | Status: DC

## 2011-05-24 MED ORDER — LORAZEPAM 0.5 MG PO TABS: 0.5000 mg | ORAL_TABLET | Freq: Four times a day (QID) | ORAL | Status: DC | PRN

## 2011-05-24 MED ORDER — PROCHLORPERAZINE 25 MG RE SUPP: 25.0000 mg | Freq: Two times a day (BID) | RECTAL | Status: DC | PRN

## 2011-05-24 MED ORDER — LIDOCAINE-PRILOCAINE 2.5-2.5 % EX CREA: TOPICAL_CREAM | Freq: Once | CUTANEOUS | Status: DC

## 2011-05-24 MED ORDER — PROCHLORPERAZINE MALEATE 10 MG PO TABS: 10.0000 mg | ORAL_TABLET | Freq: Four times a day (QID) | ORAL | Status: DC | PRN

## 2011-05-24 MED ORDER — ONDANSETRON HCL 8 MG PO TABS: ORAL_TABLET | ORAL | Status: DC

## 2011-05-24 NOTE — Telephone Encounter (Signed)
S/w the pt and she is aware of her chemo appt on 06/05/2011

## 2011-05-24 NOTE — Progress Notes (Signed)
Called [phone] for ondansetron 8mg  prior auth; approved from 05/24/11-05/23/12.

## 2011-05-24 NOTE — Progress Notes (Signed)
Consult Note: Gyn-Onc  Abigail [name] 56 y.o. female  CC:  Chief Complaint  Patient presents with  . F tube ca    Follow up    HPI: [name] is a very pleasant 56 year old BRCA positive lady with a history of stage II breast cancer which was triple negative at that time, she underwent lumpectomy, sentinel lymph node evaluation and postoperative radiation to the left breast. She was also diagnosed with a stage III C. fallopian tube carcinoma in 2009 and underwent appropriate surgery followed by 6 cycles of paclitaxel and carboplatin. She has been free of disease from the prospective since that time. I last saw her in June of 2012 which time her exam was unremarkable and there's he went to 5 was negative. She is CA 125 September 4 which was 11.5. Dr. Donnie Coffin ordered a CT scan in September 2012 which revealed: No evidence of disease progression within the chest. Within the abdomen and pelvis she had essentially stable retroperitoneal para-aortic lymphadenopathy. Right aortocaval node measured 16 mm as compared to 13 mm on prior.   She had a CA 125 December 19 that was 52.6 and a repeat on April 8 that was 123.4. She underwent a PET CT on April 10 and comes in today for the results. She is also seeing Dr. Donnie Coffin later today. Within the neck there are no hypermetabolic lymph nodes. Within the chest there was an 8 mm moderately FDG avid lymph node. There was retrocrural adenopathy that was FDG avid including a 10 mm short axis lymph node on the left and a 9 mm short axis lymph node on the right. Within the abdomen and pelvis were several 1.1-1.6 gastrohepatic, (, and aortocaval nodes all FDG avid. There was widespread peritoneal metastases including a 1.8 x 2.1 cm nodule on the anterior left hepatic dome, it 0.6 x 1.7 cm nodule in the inferior right hepatic lobe, multiple omental nodules including a 2.0 x 2.5 cm nodule below the left anterior abdominal wall, a 2.5 x 3.9 cm nodule in the right abdomen near  the tip of the appendix, and multiple pelvic nodules measuring 1.8 x 1.6 cm.  We discussed this and she is very tearful understandably. In retrospect she states that she knew "something was going on". She saw her primary physician in March complaining of abdominal pain. She took some Advil and in 2 days the pain was gone and she did not followup. She occasionally feels that she needs to have a bowel movement and is unable to go to the bathroom. There is no issues with her bladder habits.  Review of Systems: She denies any chest pain, shortness of breath, nausea, fevers, chills, an intentional weight loss, or weight gain. She has any chest pain or shortness of breath. 10 point review of systems otherwise negative.  Current Meds:  Outpatient Encounter Prescriptions as of 05/24/2011  Medication Sig Dispense Refill  . amLODipine (NORVASC) 10 MG tablet TAKE 1 TABLET EVERY DAY  30 tablet  4  . Cholecalciferol (VITAMIN D3) 3000 UNITS TABS Take by mouth daily.       Marland Kitchen lisinopril-hydrochlorothiazide (PRINZIDE,ZESTORETIC) 10-12.5 MG per tablet TAKE 1 TABLET BY MOUTH DAILY  30 tablet  7   No facility-administered encounter medications on file as of 05/24/2011.    Allergy:  Allergies  Allergen Reactions  . Codeine Itching    Social Hx:   History   Social History  . Marital Status: Divorced    Spouse Name: N/A  Number of Children: N/A  . Years of Education: N/A   Occupational History  . Not on file.   Social History Main Topics  . Smoking status: Former Smoker    Types: Cigarettes    Quit date: 08/07/2006  . Smokeless tobacco: Not on file  . Alcohol Use: 0.0 oz/week     occas  . Drug Use:   . Sexually Active:    Other Topics Concern  . Not on file   Social History Narrative  . No narrative on file    Past Surgical Hx:  Past Surgical History  Procedure Date  . Tonsillectomy and adenoidectomy   . Breast lumpectomy   . Abdominal hysterectomy     TAH, BSO    Past Medical  Hx:  Past Medical History  Diagnosis Date  . Hypertension   . Obesity   . Diverticulitis   . Hemorrhoids   . Colonic polyp   . GERD (gastroesophageal reflux disease)   . Cancer     BREAST, FALLOPIAN TUBE,    Family Hx:  Family History  Problem Relation Age of Onset  . Cancer Mother   . Hypertension Mother   . Diabetes Father   . Heart disease Father   . Hypertension Father   . Stroke Father   . Hypertension Sister   . Hypertension Brother   . Hypertension Paternal Aunt   . Diabetes Paternal Aunt   . Hypertension Paternal Uncle   . Diabetes Paternal Uncle   . Hypertension Paternal Grandmother   . Hypertension Paternal Grandfather     Vitals:  Blood pressure 124/72, pulse 66, temperature 98.9 F (37.2 C), temperature source Oral, resp. rate 24, height 5\' 1"  (1.549 m), weight 211 lb 6.4 oz (95.89 kg).  Physical Exam:  Well-nourished well-developed female in no acute distress.  Neck: Supple, no lymphadenopathy no thyromegaly.  Lungs: Clear to auscultation bilaterally.  Cardiovascular: Regular rate and rhythm.  Abdomen: Obese, soft, nontender, nondistended. There is a well-healed vertical midline incision. There is no evidence of an incisional hernia. There's no palpable masses, exam is limited by habitus.  Groins: No lymphadenopathy.  Extremities: Trace to 1+ edema equal bilaterally.  Pelvic: No masses or nodularity. Rectal confirms.    Assessment/Plan: 56 year old with history of breast cancer BRCA positivity as well as a history of IIIc fallopian tube carcinoma. She unfortunately has a recurrence of her disease. She was initially diagnosed in the early part of 2009 and is platinum and taxanes sensitive. There are no protocols which she is eligible for secondary to her history of breast cancer in 2010. I discussed with her the role of chemotherapy versus surgery. I have also spoken to Dr. Donnie Coffin. 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 I would recommend reinstitution of platinum and taxine based chemotherapy. After 3 cycles I would recommend imaging her. She's had a dramatic response and only remained a few dominant lesions there may be a role for surgery at that time. However, if the response is dramatic she may not need surgery altogether. I did speak with Dr. Donnie Coffin regarding the role of bevacizumab. That might be something that we consider should she not need any additional surgical procedures. I would hold on that secondary to the long half-life of the drug and the delay in surgery should we decide she would benefit from an interval cytoreductive surgery.  Cleda Mccreedy A., MD 05/24/2011, 4:34 PM

## 2011-05-24 NOTE — Patient Instructions (Signed)
Follow up as scheduled.  

## 2011-05-24 NOTE — Progress Notes (Signed)
Hematology and Oncology Follow Up Visit  Abigail [name] [id number] 1955-11-08 56 y.o. 05/24/2011 1:31 PM PCP  Principle Diagnosis: 56 yo woman with   1. 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  Two sentinel lymph nodes negative for metastatic disease.  2. 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 Status post radiation therapy to left breast completed February 2010 3. Hx of BRCA abnormality Interim History:  There have been no intercurrent illness, hospitalizations or medication changes.She has been having some episodes of nausea with no emeisis over the past few weeks , no abdominal pain noted.no weight changes.  Medications: I have reviewed the patient's current medications.  Allergies:  Allergies  Allergen Reactions  . Codeine Itching    Past Medical History, Surgical history, Social history, and Family History were reviewed and updated.  Review of Systems: Constitutional:  Negative for fever, chills, night sweats, anorexia, weight loss, pain. Cardiovascular: negative Respiratory: no cough, shortness of breath, or wheezing Neurological: negative Dermatological: negative ENT: negative Skin Gastrointestinal: nausea, abdominal discomfort Genito-Urinary: negative Hematological and Lymphatic: negative Breast: negative Musculoskeletal: negative Remaining ROS negative.  Physical Exam: Blood pressure 118/82, pulse 130, temperature 98.4 F (36.9 C), height 5\' 1"  (1.549 m), weight 212 lb 4.8 oz (96.299 kg). ECOG: 0 General appearance: alert, cooperative and appears stated age Head: Normocephalic, without obvious abnormality, atraumatic Neck: no adenopathy, no carotid bruit, no JVD, supple, symmetrical, trachea midline and thyroid not enlarged, symmetric, no tenderness/mass/nodules Lymph nodes: Cervical, supraclavicular, and  axillary nodes normal. Cardiac : regular rate and rhythm, no murmurs or gallops Pulmonary:clear to auscultation bilaterally and normal percussion bilaterally Breasts: inspection negative, no nipple discharge or bleeding, no masses or nodularity palpable Abdomen:sl distended abdomen , nl bwel sounds, no rebound , questionable ascites. Extremities negative Neuro: alert, oriented, normal speech, no focal findings or movement disorder noted  Lab Results: Lab Results  Component Value Date   WBC 8.0 05/04/2011   HGB 12.7 05/04/2011   HCT 38.3 05/04/2011   MCV 112.1* 05/04/2011   PLT 299 05/04/2011     Chemistry      Component Value Date/Time   NA 135 05/04/2011 1516   K 3.3* 05/04/2011 1516   CL 94* 05/04/2011 1516   CO2 26 05/04/2011 1516   BUN 7 05/04/2011 1516   CREATININE 0.81 05/04/2011 1516      Component Value Date/Time   CALCIUM 9.7 05/04/2011 1516   ALKPHOS 127* 05/04/2011 1516   AST 34 05/04/2011 1516   ALT 18 05/04/2011 1516   BILITOT 0.4 05/04/2011 1516      .pathology. Radiological Studies: chest X-ray n/a Mammogram Due 5/13 Bone density 3/12  Impression and Plan: Patient is returns today. She did have a PET scan which we reviewed in the office today. She has multiple soft tissue nodules seen throughout abdomen. Her thorax is free of any obvious recurrent cancer. She feels otherwise pretty well has no specific complaints. I reviewed her case with Dr. Duard Brady, who is in the office today as well. She indicated that she may be candidate for second look surgery and debulking after she's had appropriate amount of chemotherapy. She approaches I candidate for a study because of her history of breast cancer. I reviewed her PET scan. I think that given her previous history of both breast and ovarian cancer be appropriate to get a biopsy of one of these areas  to confirm that his ovarian cancer given that both her CA 125 and CA 27-29 both elevated. Discussed the chemotherapy given the  setting given that she has had been for off of treatment for approximately 2 years, as she is as likely respond to repeat Taxol carboplatin administration. I have indicated we'll go ahead with port placement as well as biopsy . I have tentatively scheduled her to start chemotherapy on 06/04/2011  More than 50% of the visit was spent in patient-related counselling   Pierce Crane, MD 4/18/20131:31 PM

## 2011-05-24 NOTE — Patient Instructions (Signed)
   TO TAKE DECADRON , 2 PILLS THE NIGHT BEFORE CHEMO AND 6 HOURS BEFORE CHEMO, IE THE DAY OF CHEMO WE WILL SCHEDULE PORT PLACEMENT AND DO BIOPSY BEFORE CHEMO. WE WILL SCHEDULE A VISIT WITH CHRIS 1 WEEK AFTER CHEMO

## 2011-05-24 NOTE — Progress Notes (Signed)
zofran prescription received from [facility] that requires prior authorization. Given to [facility] in managed care.

## 2011-05-24 NOTE — Telephone Encounter (Signed)
S/w tina from [facility] and the pt's port placement is scheduled for 05/29/2011

## 2011-05-25 ENCOUNTER — Encounter (HOSPITAL_COMMUNITY): Payer: Self-pay | Admitting: Pharmacy Technician

## 2011-05-25 ENCOUNTER — Telehealth: Payer: Self-pay

## 2011-05-25 NOTE — Telephone Encounter (Signed)
Received VM from Abigail [name] in centralized scheduling stating pt has been scheduled for biopsy on Wednesday 4/24 at 1100.  Pt has been notified by scheduling. dph

## 2011-05-28 ENCOUNTER — Other Ambulatory Visit: Payer: Self-pay | Admitting: Radiology

## 2011-05-29 ENCOUNTER — Other Ambulatory Visit: Payer: Self-pay | Admitting: Oncology

## 2011-05-29 ENCOUNTER — Encounter (HOSPITAL_COMMUNITY): Payer: Self-pay | Admitting: Pharmacy Technician

## 2011-05-29 ENCOUNTER — Ambulatory Visit (HOSPITAL_COMMUNITY)
Admission: RE | Admit: 2011-05-29 | Discharge: 2011-05-29 | Disposition: A | Discharge status: Home / Self Care | Payer: Medicare Other | Source: Ambulatory Visit | Attending: Oncology | Admitting: Oncology

## 2011-05-29 ENCOUNTER — Encounter (HOSPITAL_COMMUNITY): Payer: Self-pay

## 2011-05-29 DIAGNOSIS — C5700 Malignant neoplasm of unspecified fallopian tube: Secondary | ICD-10-CM

## 2011-05-29 DIAGNOSIS — E669 Obesity, unspecified: Secondary | ICD-10-CM | POA: Insufficient documentation

## 2011-05-29 DIAGNOSIS — C50919 Malignant neoplasm of unspecified site of unspecified female breast: Secondary | ICD-10-CM | POA: Insufficient documentation

## 2011-05-29 DIAGNOSIS — Z87891 Personal history of nicotine dependence: Secondary | ICD-10-CM | POA: Insufficient documentation

## 2011-05-29 DIAGNOSIS — I10 Essential (primary) hypertension: Secondary | ICD-10-CM | POA: Insufficient documentation

## 2011-05-29 DIAGNOSIS — K219 Gastro-esophageal reflux disease without esophagitis: Secondary | ICD-10-CM | POA: Insufficient documentation

## 2011-05-29 DIAGNOSIS — Z1501 Genetic susceptibility to malignant neoplasm of breast: Secondary | ICD-10-CM

## 2011-05-29 DIAGNOSIS — R599 Enlarged lymph nodes, unspecified: Secondary | ICD-10-CM | POA: Insufficient documentation

## 2011-05-29 LAB — CBC
Platelets: 228 10*3/uL (ref 150–400)
RBC: 3.53 MIL/uL — ABNORMAL LOW (ref 3.87–5.11)
RDW: 16.5 % — ABNORMAL HIGH (ref 11.5–15.5)
WBC: 6.6 10*3/uL (ref 4.0–10.5)

## 2011-05-29 LAB — PROTIME-INR: INR: 0.99 (ref 0.00–1.49)

## 2011-05-29 LAB — APTT: aPTT: 27 seconds (ref 24–37)

## 2011-05-29 MED ORDER — LIDOCAINE HCL 1 % IJ SOLN
INTRAMUSCULAR | Status: AC
  Filled 2011-05-29: qty 20

## 2011-05-29 MED ORDER — MIDAZOLAM HCL 2 MG/2ML IJ SOLN
INTRAMUSCULAR | Status: AC
  Filled 2011-05-29: qty 2

## 2011-05-29 MED ORDER — CEFAZOLIN SODIUM-DEXTROSE 2-3 GM-% IV SOLR
2.0000 g | Freq: Once | INTRAVENOUS | Status: AC
  Administered 2011-05-29: 2 g via INTRAVENOUS

## 2011-05-29 MED ORDER — MIDAZOLAM HCL 5 MG/5ML IJ SOLN
INTRAMUSCULAR | Status: AC | PRN
  Administered 2011-05-29 (×2): 2 mg via INTRAVENOUS

## 2011-05-29 MED ORDER — FENTANYL CITRATE 0.05 MG/ML IJ SOLN
INTRAMUSCULAR | Status: AC | PRN
  Administered 2011-05-29 (×2): 100 ug via INTRAVENOUS

## 2011-05-29 MED ORDER — HEPARIN SOD (PORK) LOCK FLUSH 100 UNIT/ML IV SOLN
500.0000 [IU] | Freq: Once | INTRAVENOUS | Status: AC
  Administered 2011-05-29: 500 [IU] via INTRAVENOUS

## 2011-05-29 MED ORDER — SODIUM CHLORIDE 0.9 % IV SOLN: Freq: Once | INTRAVENOUS | Status: DC

## 2011-05-29 MED ORDER — FENTANYL CITRATE 0.05 MG/ML IJ SOLN
INTRAMUSCULAR | Status: AC
  Filled 2011-05-29: qty 2

## 2011-05-29 MED ORDER — CEFAZOLIN SODIUM-DEXTROSE 2-3 GM-% IV SOLR
INTRAVENOUS | Status: AC
  Filled 2011-05-29: qty 50

## 2011-05-29 NOTE — H&P (Signed)
Abigail [name] is an 56 y.o. female.   Chief Complaint: Hx breast Ca; fallopian tube Ca CT shows retroperitoneal lymph node enlargement Scheduled for Port a cath placement HPI: HTN; GERD  Past Medical History  Diagnosis Date  . Hypertension   . Obesity   . Diverticulitis   . Hemorrhoids   . Colonic polyp   . GERD (gastroesophageal reflux disease)   . Cancer     BREAST, FALLOPIAN TUBE,    Past Surgical History  Procedure Date  . Tonsillectomy and adenoidectomy   . Breast lumpectomy   . Abdominal hysterectomy     TAH, BSO    Family History  Problem Relation Age of Onset  . Cancer Mother   . Hypertension Mother   . Diabetes Father   . Heart disease Father   . Hypertension Father   . Stroke Father   . Hypertension Sister   . Hypertension Brother   . Hypertension Paternal Aunt   . Diabetes Paternal Aunt   . Hypertension Paternal Uncle   . Diabetes Paternal Uncle   . Hypertension Paternal Grandmother   . Hypertension Paternal Grandfather    Social History:  reports that she quit smoking about 4 years ago. Her smoking use included Cigarettes. She does not have any smokeless tobacco history on file. She reports that she drinks alcohol. Her drug history not on file.  Allergies:  Allergies  Allergen Reactions  . Codeine Itching     (Not in a hospital admission)  No results found for this or any previous visit (from the past 48 hour(s)). No results found.  Review of Systems  Constitutional: Negative for fever.  Cardiovascular: Negative for chest pain.  Gastrointestinal: Negative for nausea and vomiting.  Musculoskeletal: Negative for back pain.  Neurological: Negative for headaches.    Blood pressure 106/71, pulse 102, temperature 98.4 F (36.9 C), resp. rate 15, SpO2 98.00%. Physical Exam  Constitutional: She is oriented to person, place, and time. She appears well-developed and well-nourished.  Cardiovascular: Normal rate, regular rhythm and normal heart  sounds.   No murmur heard. Respiratory: Effort normal and breath sounds normal. She has no wheezes.  GI: Soft. Bowel sounds are normal. There is no tenderness.  Musculoskeletal: Normal range of motion.  Neurological: She is alert and oriented to person, place, and time. Coordination normal.  Skin: Skin is warm.  Psychiatric: She has a normal mood and affect. Her behavior is normal. Judgment and thought content normal.     Assessment/Plan Pt with hx of breast ca and fallopian tube ca Retroperitoneal lymph node enlargement Scheduled now for [facility] a Cath placement Pt aware of procedure benefits and risks and agreeable to proceed. Consent signed  Ella Golomb A 05/29/2011, 8:44 AM

## 2011-05-29 NOTE — Procedures (Signed)
RIJV PAC tip SVC RA No comp 

## 2011-05-29 NOTE — Discharge Instructions (Signed)

## 2011-05-30 ENCOUNTER — Ambulatory Visit (HOSPITAL_COMMUNITY)
Admission: RE | Admit: 2011-05-30 | Discharge: 2011-05-30 | Disposition: A | Discharge status: Home / Self Care | Payer: Medicare Other | Source: Ambulatory Visit | Attending: Oncology | Admitting: Oncology

## 2011-05-30 ENCOUNTER — Encounter (HOSPITAL_COMMUNITY): Payer: Self-pay

## 2011-05-30 VITALS — BP 114/75 | HR 107 | Temp 97.4°F | Resp 18 | Ht 61.0 in | Wt 212.0 lb

## 2011-05-30 DIAGNOSIS — C5700 Malignant neoplasm of unspecified fallopian tube: Secondary | ICD-10-CM | POA: Insufficient documentation

## 2011-05-30 DIAGNOSIS — Z8543 Personal history of malignant neoplasm of ovary: Secondary | ICD-10-CM | POA: Insufficient documentation

## 2011-05-30 DIAGNOSIS — Z1501 Genetic susceptibility to malignant neoplasm of breast: Secondary | ICD-10-CM | POA: Insufficient documentation

## 2011-05-30 DIAGNOSIS — Z853 Personal history of malignant neoplasm of breast: Secondary | ICD-10-CM | POA: Insufficient documentation

## 2011-05-30 DIAGNOSIS — R1909 Other intra-abdominal and pelvic swelling, mass and lump: Secondary | ICD-10-CM | POA: Insufficient documentation

## 2011-05-30 LAB — BASIC METABOLIC PANEL
CO2: 25 mEq/L (ref 19–32)
Calcium: 10.2 mg/dL (ref 8.4–10.5)
Chloride: 97 mEq/L (ref 96–112)
Glucose, Bld: 96 mg/dL (ref 70–99)
Potassium: 3.5 mEq/L (ref 3.5–5.1)
Sodium: 134 mEq/L — ABNORMAL LOW (ref 135–145)

## 2011-05-30 MED ORDER — SODIUM CHLORIDE 0.9 % IV SOLN
Freq: Once | INTRAVENOUS | Status: AC
  Administered 2011-05-30: 10:00:00 via INTRAVENOUS

## 2011-05-30 MED ORDER — MIDAZOLAM HCL 5 MG/5ML IJ SOLN
INTRAMUSCULAR | Status: AC | PRN
  Administered 2011-05-30: 2 mg via INTRAVENOUS

## 2011-05-30 MED ORDER — FENTANYL CITRATE 0.05 MG/ML IJ SOLN
INTRAMUSCULAR | Status: AC
  Filled 2011-05-30: qty 4

## 2011-05-30 MED ORDER — MIDAZOLAM HCL 2 MG/2ML IJ SOLN
INTRAMUSCULAR | Status: AC
  Filled 2011-05-30: qty 2

## 2011-05-30 MED ORDER — FENTANYL CITRATE 0.05 MG/ML IJ SOLN
INTRAMUSCULAR | Status: AC | PRN
  Administered 2011-05-30: 100 ug via INTRAVENOUS

## 2011-05-30 NOTE — Procedures (Signed)
CT core biopsy R mesenteric mass 18g x4 No complication No blood loss. See complete dictation in [facility].

## 2011-05-30 NOTE — Progress Notes (Signed)
Pt has new PAC  (inserted 05/29/11) right chest with dressing still intact.   Not used for today's procedure because it is "a little sore" Peripheral IV site obtained and labs drawn without difficulty

## 2011-05-30 NOTE — H&P (Signed)
Abigail [name] is an 56 y.o. female.   Chief Complaint: right lower abdominal mass HPI: Patient with history of fallopian tube /breast carcinoma and hypermetabolic right lower quadrant mass on recent PET scan presents today for CT guided biopsy.  Past Medical History  Diagnosis Date  . Hypertension   . Obesity   . Diverticulitis   . Hemorrhoids   . Colonic polyp   . GERD (gastroesophageal reflux disease)   . Cancer     BREAST, FALLOPIAN TUBE,    Past Surgical History  Procedure Date  . Tonsillectomy and adenoidectomy   . Breast lumpectomy   . Abdominal hysterectomy     TAH, BSO    Family History  Problem Relation Age of Onset  . Cancer Mother   . Hypertension Mother   . Diabetes Father   . Heart disease Father   . Hypertension Father   . Stroke Father   . Hypertension Sister   . Hypertension Brother   . Hypertension Paternal Aunt   . Diabetes Paternal Aunt   . Hypertension Paternal Uncle   . Diabetes Paternal Uncle   . Hypertension Paternal Grandmother   . Hypertension Paternal Grandfather    Social History:  reports that she quit smoking about 4 years ago. Her smoking use included Cigarettes. She does not have any smokeless tobacco history on file. She reports that she drinks alcohol. Her drug history not on file.  Allergies:  Allergies  Allergen Reactions  . Codeine Itching     (Not in a hospital admission)  Results for orders placed during the hospital encounter of 05/29/11 (from the past 48 hour(s))  APTT     Status: Normal   Collection Time   05/29/11  8:25 AM      Component Value Range Comment   aPTT 27  24 - 37 (seconds)   CBC     Status: Abnormal   Collection Time   05/29/11  8:25 AM      Component Value Range Comment   WBC 6.6  4.0 - 10.5 (K/uL)    RBC 3.53 (*) 3.87 - 5.11 (MIL/uL)    Hemoglobin 13.0  12.0 - 15.0 (g/dL)    HCT 16.1  09.6 - 04.5 (%)    MCV 109.3 (*) 78.0 - 100.0 (fL)    MCH 36.8 (*) 26.0 - 34.0 (pg)    MCHC 33.7  30.0 - 36.0  (g/dL)    RDW 40.9 (*) 81.1 - 15.5 (%)    Platelets 228  150 - 400 (K/uL)   PROTIME-INR     Status: Normal   Collection Time   05/29/11  8:25 AM      Component Value Range Comment   Prothrombin Time 13.3  11.6 - 15.2 (seconds)    INR 0.99  0.00 - 1.49     Ir Fluoro Guide Cv Line Right  05/29/2011  *RADIOLOGY REPORT*  Clinical Data/Indication: Breast and fallopian tube cancer  TUNNEL POWER PORT PLACEMENT WITH SUBCUTANEOUS POCKET UTILIZING ULTRASOUND & FLOUROSCOPY  Sedation: Versed 4 mg, Fentanyl 200 mcg.  Total Moderate Sedation Time: 45 minutes.  As antibiotic prophylaxis, Ancef 2 gm was ordered pre-procedure and administered intravenously within one hour of incision.  Fluoroscopy Time: 0.6 minutes.  Procedure:  After written informed consent was obtained, patient was placed in the supine position on angiographic table. The right neck and chest was prepped and draped in a sterile fashion. Lidocaine was utilized for local anesthesia.  The right internal jugular vein was noted  to be patent initially with ultrasound. Under sonographic guidance, a micropuncture needle was inserted into the right IJ vein (Ultrasound and fluoroscopic image documentation was performed). The needle was removed over an 018 wire which was exchanged for a Amplatz.  This was advanced into the IVC.  An 8-French dilator was advanced over the Amplatz.  A small incision was made in the right upper chest over the anterior right second rib.  Utilizing blunt dissection, a subcutaneous pocket was created in the caudal direction. The pocket was irrigated with a copious amount of sterile normal saline.  The port catheter was tunneled from the chest incision, and out the neck incision.  The reservoir was inserted into the subcutaneous pocket and secured with two 3-0 Ethilon stitches.  A peel-away sheath was advanced over the Amplatz wire.  The port catheter was cut to measure length and inserted through the peel-away sheath. The peel-away sheath  was removed.  The chest incision was closed with 3-0 Vicryl interrupted stitches for the subcutaneous tissue and a running of 4-0 Vicryl subcuticular stitch for the skin.  The neck incision was closed with a 4-0 Vicryl subcuticular stitch. Derma-bond was applied to both surgical incisions.  The port reservoir was flushed and instilled with heparinized saline.  No complications.  Findings:  A right IJ vein Port-A-Cath is in place with its tip at the cavoatrial junction.  IMPRESSION: Successful 8 French right internal jugular vein power port placement with its tip at the SVC/RA junction.  Original Report Authenticated By: Donavan Burnet, M.D.   Ir US Guide Vasc Access Right  05/29/2011  *RADIOLOGY REPORT*  Clinical Data/Indication: Breast and fallopian tube cancer  TUNNEL POWER PORT PLACEMENT WITH SUBCUTANEOUS POCKET UTILIZING ULTRASOUND & FLOUROSCOPY  Sedation: Versed 4 mg, Fentanyl 200 mcg.  Total Moderate Sedation Time: 45 minutes.  As antibiotic prophylaxis, Ancef 2 gm was ordered pre-procedure and administered intravenously within one hour of incision.  Fluoroscopy Time: 0.6 minutes.  Procedure:  After written informed consent was obtained, patient was placed in the supine position on angiographic table. The right neck and chest was prepped and draped in a sterile fashion. Lidocaine was utilized for local anesthesia.  The right internal jugular vein was noted to be patent initially with ultrasound. Under sonographic guidance, a micropuncture needle was inserted into the right IJ vein (Ultrasound and fluoroscopic image documentation was performed). The needle was removed over an 018 wire which was exchanged for a Amplatz.  This was advanced into the IVC.  An 8-French dilator was advanced over the Amplatz.  A small incision was made in the right upper chest over the anterior right second rib.  Utilizing blunt dissection, a subcutaneous pocket was created in the caudal direction. The pocket was irrigated with a  copious amount of sterile normal saline.  The port catheter was tunneled from the chest incision, and out the neck incision.  The reservoir was inserted into the subcutaneous pocket and secured with two 3-0 Ethilon stitches.  A peel-away sheath was advanced over the Amplatz wire.  The port catheter was cut to measure length and inserted through the peel-away sheath. The peel-away sheath was removed.  The chest incision was closed with 3-0 Vicryl interrupted stitches for the subcutaneous tissue and a running of 4-0 Vicryl subcuticular stitch for the skin.  The neck incision was closed with a 4-0 Vicryl subcuticular stitch. Derma-bond was applied to both surgical incisions.  The port reservoir was flushed and instilled with heparinized saline.  No  complications.  Findings:  A right IJ vein Port-A-Cath is in place with its tip at the cavoatrial junction.  IMPRESSION: Successful 8 French right internal jugular vein power port placement with its tip at the SVC/RA junction.  Original Report Authenticated By: Donavan Burnet, M.D.    Review of Systems  Constitutional: Negative for fever and chills.  Respiratory: Negative for cough and shortness of breath.   Cardiovascular:       Mild soreness at right IJ port site(placed 4/23)  Gastrointestinal: Negative for nausea, vomiting and abdominal pain.  Musculoskeletal: Negative for back pain.  Neurological: Negative for headaches.  Endo/Heme/Allergies: Does not bruise/bleed easily.    Blood pressure 117/74, pulse 105, resp. rate 18, height 5\' 1"  (1.549 m), weight 212 lb (96.163 kg), SpO2 97.00%. Physical Exam  Constitutional: She is oriented to person, place, and time. She appears well-developed and well-nourished.  Cardiovascular:       Tachycardic,reg rhythm  Respiratory: Effort normal and breath sounds normal.  GI: Soft. Bowel sounds are normal. There is no tenderness.       obese  Musculoskeletal: Normal range of motion. She exhibits edema.    Neurological: She is alert and oriented to person, place, and time.     Assessment/Plan Patient with fallopian tube/breast carcinoma and hypermetabolic right lower quadrant abdominal mass; plan is for CT guided right lower quadrant mass biopsy. Details/risks of procedure d/w pt with her understanding and consent.  Jibreel Fedewa,D KEVIN 05/30/2011, 10:16 AM

## 2011-05-30 NOTE — Discharge Instructions (Signed)
Biopsy  Care After  Refer to this sheet in the next few weeks. These instructions provide you with information on caring for yourself after your procedure. Your caregiver may also give you more specific instructions. Your treatment has been planned according to current medical practices, but problems sometimes occur. Call your caregiver if you have any problems or questions after your procedure.  If you had a fine needle biopsy, you may have soreness at the biopsy site for 1 to 2 days. If you had an open biopsy, you may have soreness at the biopsy site for 3 to 4 days.  HOME CARE INSTRUCTIONS    You may resume normal diet and activities as directed.   Change bandages (dressings) as directed. If your wound was closed with a skin glue (adhesive), it will wear off and begin to peel in 7 days.   Only take over-the-counter or prescription medicines for pain, discomfort, or fever as directed by your caregiver.   Ask your caregiver when you can bathe and get your wound wet.  SEEK IMMEDIATE MEDICAL CARE IF:    You have increased bleeding (more than a small spot) from the biopsy site.   You notice redness, swelling, or increasing pain at the biopsy site.   You have pus coming from the biopsy site.   You have a fever.   You notice a bad smell coming from the biopsy site or dressing.   You have a rash, have difficulty breathing, or have any allergic problems.  MAKE SURE YOU:    Understand these instructions.   Will watch your condition.   Will get help right away if you are not doing well or get worse.  Document Released: 08/11/2004 Document Revised: 01/11/2011 Document Reviewed: 07/20/2010  [employer] Patient Information 2012 [employer]

## 2011-06-01 ENCOUNTER — Other Ambulatory Visit: Payer: Self-pay | Admitting: *Deleted

## 2011-06-01 DIAGNOSIS — C801 Malignant (primary) neoplasm, unspecified: Secondary | ICD-10-CM

## 2011-06-01 MED ORDER — LIDOCAINE-PRILOCAINE 2.5-2.5 % EX CREA: TOPICAL_CREAM | CUTANEOUS | Status: DC | PRN

## 2011-06-05 ENCOUNTER — Ambulatory Visit (HOSPITAL_BASED_OUTPATIENT_CLINIC_OR_DEPARTMENT_OTHER): Payer: Medicare Other

## 2011-06-05 VITALS — BP 127/83 | HR 93 | Temp 97.6°F

## 2011-06-05 DIAGNOSIS — Z5111 Encounter for antineoplastic chemotherapy: Secondary | ICD-10-CM

## 2011-06-05 DIAGNOSIS — C5700 Malignant neoplasm of unspecified fallopian tube: Secondary | ICD-10-CM

## 2011-06-05 DIAGNOSIS — C50219 Malignant neoplasm of upper-inner quadrant of unspecified female breast: Secondary | ICD-10-CM

## 2011-06-05 MED ORDER — DIPHENHYDRAMINE HCL 50 MG/ML IJ SOLN
50.0000 mg | Freq: Once | INTRAMUSCULAR | Status: AC
  Administered 2011-06-05: 50 mg via INTRAVENOUS

## 2011-06-05 MED ORDER — PACLITAXEL CHEMO INJECTION 300 MG/50ML
175.0000 mg/m2 | Freq: Once | INTRAVENOUS | Status: AC
  Administered 2011-06-05: 360 mg via INTRAVENOUS
  Filled 2011-06-05: qty 60

## 2011-06-05 MED ORDER — FAMOTIDINE IN NACL 20-0.9 MG/50ML-% IV SOLN
20.0000 mg | Freq: Once | INTRAVENOUS | Status: AC
  Administered 2011-06-05: 20 mg via INTRAVENOUS

## 2011-06-05 MED ORDER — SODIUM CHLORIDE 0.9 % IV SOLN
Freq: Once | INTRAVENOUS | Status: AC
  Administered 2011-06-05: 09:00:00 via INTRAVENOUS

## 2011-06-05 MED ORDER — SODIUM CHLORIDE 0.9 % IJ SOLN
100.0000 ug | Freq: Once | INTRAVENOUS | Status: AC
  Administered 2011-06-05: 0.1 mg via INTRADERMAL
  Filled 2011-06-05: qty 0.01

## 2011-06-05 MED ORDER — HEPARIN SOD (PORK) LOCK FLUSH 100 UNIT/ML IV SOLN
500.0000 [IU] | Freq: Once | INTRAVENOUS | Status: AC | PRN
  Administered 2011-06-05: 500 [IU]
  Filled 2011-06-05: qty 5

## 2011-06-05 MED ORDER — DEXAMETHASONE SODIUM PHOSPHATE 4 MG/ML IJ SOLN
20.0000 mg | Freq: Once | INTRAMUSCULAR | Status: AC
  Administered 2011-06-05: 20 mg via INTRAVENOUS

## 2011-06-05 MED ORDER — SODIUM CHLORIDE 0.9 % IJ SOLN
10.0000 mL | INTRAMUSCULAR | Status: DC | PRN
  Administered 2011-06-05: 10 mL
  Filled 2011-06-05: qty 10

## 2011-06-05 MED ORDER — SODIUM CHLORIDE 0.9 % IV SOLN
715.0000 mg | Freq: Once | INTRAVENOUS | Status: AC
  Administered 2011-06-05: 720 mg via INTRAVENOUS
  Filled 2011-06-05: qty 72

## 2011-06-05 MED ORDER — ONDANSETRON 16 MG/50ML IVPB (CHCC)
16.0000 mg | Freq: Once | INTRAVENOUS | Status: AC
  Administered 2011-06-05: 16 mg via INTRAVENOUS

## 2011-06-05 NOTE — Progress Notes (Signed)
1610 Carboplatin intradermal skin test initiated to right forearm. 0930 5 minute skin test negative. 0945 15 minute skin test negative. 1000 30 minute skin test negative. Patient's right forearm with no redness, itching or swelling present.

## 2011-06-05 NOTE — Patient Instructions (Signed)
[facility] Discharge Instructions for Patients Receiving Chemotherapy  Today you received the following chemotherapy agents; Carboplatin and Paclitaxel  To help prevent nausea and vomiting after your treatment, we encourage you to take your nausea medication. Begin taking it as often as prescribed by your physician.    If you develop nausea and vomiting that is not controlled by your nausea medication, call the clinic. If it is after clinic hours your family physician or the after hours number for the clinic or go to the Emergency Department.   BELOW ARE SYMPTOMS THAT SHOULD BE REPORTED IMMEDIATELY:  *FEVER GREATER THAN 100.5 F  *CHILLS WITH OR WITHOUT FEVER  NAUSEA AND VOMITING THAT IS NOT CONTROLLED WITH YOUR NAUSEA MEDICATION  *UNUSUAL SHORTNESS OF BREATH  *UNUSUAL BRUISING OR BLEEDING  TENDERNESS IN MOUTH AND THROAT WITH OR WITHOUT PRESENCE OF ULCERS  *URINARY PROBLEMS  *BOWEL PROBLEMS  UNUSUAL RASH Items with * indicate a potential emergency and should be followed up as soon as possible.  One of the nurses will contact you 24 hours after your treatment. Please let the nurse know about any problems that you may have experienced. Feel free to call the clinic you have any questions or concerns. The clinic phone number is [phone].   I have been informed and understand all the instructions given to me. I know to contact the clinic, my physician, or go to the Emergency Department if any problems should occur. I do not have any questions at this time, but understand that I may call the clinic during office hours   should I have any questions or need assistance in obtaining follow up care.    __________________________________________  _____________  __________ Signature of Patient or Authorized Representative            Date                   Time    __________________________________________ Nurse's Signature

## 2011-06-07 ENCOUNTER — Telehealth: Payer: Self-pay

## 2011-06-11 ENCOUNTER — Telehealth: Payer: Self-pay | Admitting: Oncology

## 2011-06-11 ENCOUNTER — Other Ambulatory Visit: Payer: Medicare Other | Admitting: Lab

## 2011-06-11 ENCOUNTER — Ambulatory Visit: Payer: Medicare Other | Admitting: Physician Assistant

## 2011-06-11 NOTE — Telephone Encounter (Signed)
S/w cl from the lab regarding the pt's appt at 8:30am. Cl is aware that i could not move the md appt any later due to their was no available slots on 06/12/2011 with the md

## 2011-06-12 ENCOUNTER — Encounter: Payer: Self-pay | Admitting: Physician Assistant

## 2011-06-12 ENCOUNTER — Ambulatory Visit: Payer: Medicare Other | Admitting: Physician Assistant

## 2011-06-12 ENCOUNTER — Telehealth: Payer: Self-pay | Admitting: *Deleted

## 2011-06-12 ENCOUNTER — Inpatient Hospital Stay (HOSPITAL_COMMUNITY)
Admission: AD | Admit: 2011-06-12 | Discharge: 2011-06-16 | DRG: 809 | Disposition: A | Discharge status: Home / Self Care | Payer: Medicare Other | Source: Ambulatory Visit | Attending: Oncology | Admitting: Oncology

## 2011-06-12 ENCOUNTER — Other Ambulatory Visit (HOSPITAL_BASED_OUTPATIENT_CLINIC_OR_DEPARTMENT_OTHER): Payer: Medicare Other | Admitting: Lab

## 2011-06-12 ENCOUNTER — Encounter (HOSPITAL_COMMUNITY): Payer: Self-pay

## 2011-06-12 ENCOUNTER — Inpatient Hospital Stay (HOSPITAL_COMMUNITY): Payer: Medicare Other

## 2011-06-12 VITALS — BP 134/91 | HR 144 | Temp 102.6°F | Ht 61.0 in | Wt 208.8 lb

## 2011-06-12 DIAGNOSIS — Z1501 Genetic susceptibility to malignant neoplasm of breast: Secondary | ICD-10-CM

## 2011-06-12 DIAGNOSIS — T451X5A Adverse effect of antineoplastic and immunosuppressive drugs, initial encounter: Secondary | ICD-10-CM | POA: Diagnosis present

## 2011-06-12 DIAGNOSIS — D709 Neutropenia, unspecified: Secondary | ICD-10-CM | POA: Diagnosis present

## 2011-06-12 DIAGNOSIS — R5081 Fever presenting with conditions classified elsewhere: Secondary | ICD-10-CM | POA: Diagnosis present

## 2011-06-12 DIAGNOSIS — C5700 Malignant neoplasm of unspecified fallopian tube: Secondary | ICD-10-CM

## 2011-06-12 DIAGNOSIS — D702 Other drug-induced agranulocytosis: Principal | ICD-10-CM | POA: Diagnosis present

## 2011-06-12 DIAGNOSIS — Z79899 Other long term (current) drug therapy: Secondary | ICD-10-CM

## 2011-06-12 DIAGNOSIS — I10 Essential (primary) hypertension: Secondary | ICD-10-CM | POA: Diagnosis present

## 2011-06-12 DIAGNOSIS — Z6841 Body Mass Index (BMI) 40.0 and over, adult: Secondary | ICD-10-CM

## 2011-06-12 DIAGNOSIS — E669 Obesity, unspecified: Secondary | ICD-10-CM | POA: Diagnosis present

## 2011-06-12 LAB — COMPREHENSIVE METABOLIC PANEL
Albumin: 3.1 g/dL — ABNORMAL LOW (ref 3.5–5.2)
Alkaline Phosphatase: 79 U/L (ref 39–117)
BUN: 21 mg/dL (ref 6–23)
Calcium: 8.9 mg/dL (ref 8.4–10.5)
Creatinine, Ser: 0.82 mg/dL (ref 0.50–1.10)
Glucose, Bld: 126 mg/dL — ABNORMAL HIGH (ref 70–99)
Potassium: 4.3 mEq/L (ref 3.5–5.3)

## 2011-06-12 LAB — CBC WITH DIFFERENTIAL/PLATELET
Basophils Absolute: 0 10*3/uL (ref 0.0–0.1)
EOS%: 3.5 % (ref 0.0–7.0)
HCT: 35.4 % (ref 34.8–46.6)
HGB: 12.4 g/dL (ref 11.6–15.9)
LYMPH%: 66.7 % — ABNORMAL HIGH (ref 14.0–49.7)
MCH: 35.9 pg — ABNORMAL HIGH (ref 25.1–34.0)
MCV: 102.6 fL — ABNORMAL HIGH (ref 79.5–101.0)
NEUT%: 28 % — ABNORMAL LOW (ref 38.4–76.8)
Platelets: 201 10*3/uL (ref 145–400)
lymph#: 0.4 10*3/uL — ABNORMAL LOW (ref 0.9–3.3)

## 2011-06-12 MED ORDER — ENOXAPARIN SODIUM 40 MG/0.4ML ~~LOC~~ SOLN
40.0000 mg | SUBCUTANEOUS | Status: DC
  Administered 2011-06-12 – 2011-06-15 (×4): 40 mg via SUBCUTANEOUS
  Filled 2011-06-12 (×5): qty 0.4

## 2011-06-12 MED ORDER — SENNOSIDES-DOCUSATE SODIUM 8.6-50 MG PO TABS
1.0000 | ORAL_TABLET | Freq: Every evening | ORAL | Status: DC | PRN
  Filled 2011-06-12: qty 1

## 2011-06-12 MED ORDER — DIPHENOXYLATE-ATROPINE 2.5-0.025 MG PO TABS: 1.0000 | ORAL_TABLET | Freq: Four times a day (QID) | ORAL | Status: DC | PRN

## 2011-06-12 MED ORDER — LORAZEPAM 2 MG/ML IJ SOLN: 0.5000 mg | Freq: Four times a day (QID) | INTRAMUSCULAR | Status: DC | PRN

## 2011-06-12 MED ORDER — FILGRASTIM 480 MCG/0.8ML IJ SOLN
480.0000 ug | Freq: Every day | INTRAMUSCULAR | Status: DC
  Filled 2011-06-12: qty 0.8

## 2011-06-12 MED ORDER — VANCOMYCIN HCL 1000 MG IV SOLR
1250.0000 mg | Freq: Two times a day (BID) | INTRAVENOUS | Status: DC
  Administered 2011-06-12 – 2011-06-16 (×8): 1250 mg via INTRAVENOUS
  Filled 2011-06-12 (×10): qty 1250

## 2011-06-12 MED ORDER — VANCOMYCIN HCL 500 MG IV SOLR: 500.0000 mg | Freq: Four times a day (QID) | INTRAVENOUS | Status: DC

## 2011-06-12 MED ORDER — SODIUM CHLORIDE 0.9 % IV SOLN
INTRAVENOUS | Status: DC
  Administered 2011-06-12 – 2011-06-14 (×3): via INTRAVENOUS
  Administered 2011-06-15: 75 mL/h via INTRAVENOUS
  Administered 2011-06-16: 07:00:00 via INTRAVENOUS

## 2011-06-12 MED ORDER — PROMETHAZINE HCL 25 MG PO TABS
25.0000 mg | ORAL_TABLET | Freq: Four times a day (QID) | ORAL | Status: DC | PRN
  Administered 2011-06-12 – 2011-06-15 (×6): 25 mg via ORAL
  Filled 2011-06-12 (×6): qty 1

## 2011-06-12 MED ORDER — ONDANSETRON 8 MG/NS 50 ML IVPB
8.0000 mg | Freq: Three times a day (TID) | INTRAVENOUS | Status: DC | PRN
  Administered 2011-06-12: 8 mg via INTRAVENOUS
  Filled 2011-06-12: qty 8

## 2011-06-12 MED ORDER — SODIUM CHLORIDE 0.9 % IV SOLN
500.0000 mg | Freq: Three times a day (TID) | INTRAVENOUS | Status: DC
  Administered 2011-06-12 – 2011-06-13 (×3): 500 mg via INTRAVENOUS
  Filled 2011-06-12 (×4): qty 500

## 2011-06-12 MED ORDER — FILGRASTIM 480 MCG/0.8ML IJ SOLN
480.0000 ug | INTRAMUSCULAR | Status: DC
  Administered 2011-06-12 – 2011-06-15 (×4): 480 ug via SUBCUTANEOUS
  Filled 2011-06-12 (×8): qty 0.8

## 2011-06-12 MED ORDER — MORPHINE SULFATE 2 MG/ML IJ SOLN: 2.0000 mg | INTRAMUSCULAR | Status: DC | PRN

## 2011-06-12 MED ORDER — ACETAMINOPHEN 650 MG RE SUPP: 650.0000 mg | Freq: Four times a day (QID) | RECTAL | Status: DC | PRN

## 2011-06-12 MED ORDER — ACETAMINOPHEN 325 MG PO TABS
650.0000 mg | ORAL_TABLET | Freq: Four times a day (QID) | ORAL | Status: DC | PRN
  Administered 2011-06-12 – 2011-06-13 (×3): 650 mg via ORAL
  Filled 2011-06-12 (×3): qty 2

## 2011-06-12 MED ORDER — HYDROCODONE-ACETAMINOPHEN 5-325 MG PO TABS: 1.0000 | ORAL_TABLET | ORAL | Status: DC | PRN

## 2011-06-12 NOTE — Progress Notes (Signed)
ANTIBIOTIC CONSULT NOTE - INITIAL  Pharmacy Consult for Vancomycin Indication:  Febrile Neutropenia  Allergies  Allergen Reactions  . Codeine Itching  . Oxycodone Nausea Only  . Vicodin (Hydrocodone-Acetaminophen) Nausea Only    Patient Measurements: Height: 5\' 1"  (154.9 cm) Weight: 211 lb 6.7 oz (95.9 kg) IBW/kg (Calculated) : 47.8   Vital Signs: Temp: 101.5 F (38.6 C) (05/07 1055) Temp src: Oral (05/07 1055) BP: 142/88 mmHg (05/07 1127) Pulse Rate: 150  (05/07 1127) Intake/Output from previous day:   Intake/Output from this shift:    Labs:  Basename 06/12/11 1004 06/12/11 0907  WBC -- 0.6*  HGB -- 12.4  PLT -- 201  LABCREA -- --  CREATININE 0.82 --   Estimated Creatinine Clearance: 80.1 ml/min (by C-G formula based on Cr of 0.82). No results found for this basename: VANCOTROUGH:2,VANCOPEAK:2,VANCORANDOM:2,GENTTROUGH:2,GENTPEAK:2,GENTRANDOM:2,TOBRATROUGH:2,TOBRAPEAK:2,TOBRARND:2,AMIKACINPEAK:2,AMIKACINTROU:2,AMIKACIN:2, in the last 72 hours   Microbiology: No results found for this or any previous visit (from the past 720 hour(s)).  Medical History: Past Medical History  Diagnosis Date  . Hypertension   . Obesity   . Diverticulitis   . Hemorrhoids   . Colonic polyp   . GERD (gastroesophageal reflux disease)   . Cancer     BREAST, FALLOPIAN TUBE,    Medications:  Anti-infectives     Start     Dose/Rate Route Frequency Ordered Stop   06/12/11 1400   imipenem-cilastatin (PRIMAXIN) 500 mg in sodium chloride 0.9 % 100 mL IVPB        500 mg 200 mL/hr over 30 Minutes Intravenous 3 times per day 06/12/11 1131     06/12/11 1300   vancomycin (VANCOCIN) 1,250 mg in sodium chloride 0.9 % 250 mL IVPB        1,250 mg 166.7 mL/hr over 90 Minutes Intravenous Every 12 hours 06/12/11 1151     06/12/11 1131   vancomycin (VANCOCIN) 500 mg in sodium chloride 0.9 % 100 mL IVPB  Status:  Discontinued        500 mg 100 mL/hr over 60 Minutes Intravenous Every 6 hours  06/12/11 1131 06/12/11 1150         Assessment:  Recurrence of Fallopian tube Ca; Day 7 of Cycle 1 Carbo/Taxol. Noted no Neulasta received on D2.  WBC 0.6K, ANC 0.2, Tmax 102.6. Urine cx ordered.  Vancomycin ordered per pharmacy, Primaxin per MD  Goal of Therapy:  Vancomycin trough level 15-20 mcg/ml  Plan:  Vancomycin 1250mg  q12hr Primaxin 500mg  IV q8h (appropriate for wt and renal function) Monitor renal function, culture.  Otho Bellows PharmD Pager [phone] 06/12/2011,1:19 PM

## 2011-06-12 NOTE — Progress Notes (Signed)
Patient's pulse rate and temp remains high; Currently 102.1, pulse 147.  Patient wants to try to eat some soup for dinner.  Notfied Dr. Darnelle Catalan who states he will come to see the patient later.  Will continue to monitor the patient closely.

## 2011-06-12 NOTE — H&P (Signed)
Nell Gales is an 56 y.o. female.   Chief Complaint: Currently day 7 cycle one of palliative q. 3 week carboplatin/Taxol for recurrent fallopian tube carcinoma, noted to be neutropenic with fever. HPI: Ms. Fyfe is a 56 year old [name] who is actively being treated for recurrent fallopian tube carcinoma in the outpatient setting. She presented to our office today for routine assessment specifically day 7 cycle 1 of palliative every 3 week carboplatin/Taxol. She did not receive Neulasta on day 2. She states except for some low-grade nausea which came ago she felt pretty well until yesterday evening when she began having fevers and chills/riders. This morning, she experienced nausea and emesis x1. She has also had "loose" stools. Upon assessment today, she was sent to have a fever 102.6, total white blood cell count of 0.6 with an ANC 0.2. She denies any known exposure to any other illness. She denies shortness of breath or chest pain but she does have easy fatigability. She did have some mild dysuria and urgency symptoms, she is staying well hydrated. She denies any headaches, sore throat, or mouth sores. She denies a diffuse bone pain. She has had some abdominal discomfort. No pain over her prior right inguinal lymph node biopsy site.  Past Medical History  Diagnosis Date  . Hypertension   . Obesity   . Diverticulitis   . Hemorrhoids   . Colonic polyp   . GERD (gastroesophageal reflux disease)   . Cancer     BREAST, FALLOPIAN TUBE,    Past Surgical History  Procedure Date  . Tonsillectomy and adenoidectomy   . Breast lumpectomy   . Abdominal hysterectomy     TAH, BSO  . Port a cath     05/29/11    Family History  Problem Relation Age of Onset  . Cancer Mother   . Hypertension Mother   . Diabetes Father   . Heart disease Father   . Hypertension Father   . Stroke Father   . Hypertension Sister   . Hypertension Brother   . Hypertension Paternal Aunt     . Diabetes Paternal Aunt   . Hypertension Paternal Uncle   . Diabetes Paternal Uncle   . Hypertension Paternal Grandmother   . Hypertension Paternal Grandfather    Social History:  reports that she quit smoking about 4 years ago. Her smoking use included Cigarettes. She does not have any smokeless tobacco history on file. She reports that she drinks alcohol. Her drug history not on file.  Allergies:  Allergies  Allergen Reactions  . Codeine Itching  . Oxycodone Nausea Only  . Vicodin (Hydrocodone-Acetaminophen) Nausea Only     (Not in a hospital admission)  Results for orders placed in visit on 06/12/11 (from the past 48 hour(s))  CBC WITH DIFFERENTIAL     Status: Abnormal   Collection Time   06/12/11  9:07 AM      Component Value Range Comment   WBC 0.6 (*) 3.9 - 10.3 (10e3/uL)    NEUT# 0.2 (*) 1.5 - 6.5 (10e3/uL)    HGB 12.4  11.6 - 15.9 (g/dL)    HCT 16.1  09.6 - 04.5 (%)    Platelets 201  145 - 400 (10e3/uL)    MCV 102.6 (*) 79.5 - 101.0 (fL)    MCH 35.9 (*) 25.1 - 34.0 (pg)    MCHC 35.0  31.5 - 36.0 (g/dL)    RBC 4.09 (*) 8.11 - 5.45 (10e6/uL)    RDW 15.1 (*) 11.2 -  14.5 (%)    lymph# 0.4 (*) 0.9 - 3.3 (10e3/uL)    MONO# 0.0 (*) 0.1 - 0.9 (10e3/uL)    Eosinophils Absolute 0.0  0.0 - 0.5 (10e3/uL)    Basophils Absolute 0.0  0.0 - 0.1 (10e3/uL)    NEUT% 28.0 (*) 38.4 - 76.8 (%)    LYMPH% 66.7 (*) 14.0 - 49.7 (%)    MONO% 1.8  0.0 - 14.0 (%)    EOS% 3.5  0.0 - 7.0 (%)    BASO% 0.0  0.0 - 2.0 (%)    nRBC 0  0 - 0 (%)    No results found.  Review of Systems  Constitutional: Positive for fever, chills and malaise/fatigue.  HENT: Negative.   Eyes: Negative.   Respiratory: Negative.   Cardiovascular: Positive for palpitations.  Gastrointestinal: Positive for nausea, vomiting, abdominal pain and diarrhea.  Genitourinary: Positive for dysuria, urgency and frequency.  Musculoskeletal: Positive for myalgias.  Skin: Negative.   Neurological: Positive for weakness.   Endo/Heme/Allergies: Negative.   Psychiatric/Behavioral: Negative.     Blood pressure 134/91, pulse 144, temperature 102.6 F (39.2 C), temperature source Oral, height 5\' 1"  (1.549 m), weight 208 lb 12.8 oz (94.711 kg). Physical Exam  Constitutional: She is oriented to person, place, and time. She appears well-developed and well-nourished.  HENT:  Head: Normocephalic and atraumatic.  Mouth/Throat: Oropharynx is clear and moist.  Eyes: Conjunctivae and EOM are normal. Pupils are equal, round, and reactive to light.  Neck: Normal range of motion. Neck supple. No JVD present. No tracheal deviation present. No thyromegaly present.  Cardiovascular: Regular rhythm.  Exam reveals no gallop and no friction rub.   No murmur heard.      Tachycardic rate approx. 140  Respiratory: Effort normal. No stridor. No respiratory distress. She has no wheezes. She has no rales. She exhibits no tenderness.       Slight evidence of bronchial sounds.  GI: Soft. Bowel sounds are normal. She exhibits no distension and no mass. There is tenderness. There is no rebound and no guarding.  Musculoskeletal: Normal range of motion. She exhibits no edema and no tenderness.  Lymphadenopathy:    She has no cervical adenopathy.  Neurological: She is alert and oriented to person, place, and time. She displays normal reflexes. No cranial nerve deficit. She exhibits normal muscle tone. Coordination normal.  Skin: Skin is warm and dry.  Psychiatric: She has a normal mood and affect. Her behavior is normal. Judgment and thought content normal.     Assessment/Plan A 56 year old [place] woman with recurrent fallopian tube carcinoma now being treated in the palliative setting currently day 7 cycle one of every three-week carboplatin/Taxol being given in the rechallenge setting. She presented to our office today for routine assessment and was noted to have a total white blood cell count of 0.6 with an ANC of 0.2,  she was noted to be febrile with temperature 102.6 and tachycardic rate of 144. Blood pressure was slightly hypertensive, of note she has a history of hypertension, but she did not take her antihypertensives this morning. After assessment with Dr. Marikay Alar Magrinat who is covering for Dr. Pierce Crane in his absence, it was felt prudent for pancultures to be obtained, therefore blood culture times one via her port was obtained in the outpatient setting, she was admitted to the oncology floor for IV antibiotic coverage to include Primaxin 500 mg q. 6 hours, and vancomycin to be dosed via pharmacy. Chest x-ray will  also be obtained. It is noted that patient is a full code. She will also be covered with Neupogen 480 mcg subcutaneous first dose today for the next 5 days with CBCs and chemistry panels been obtained daily. Yakir Wenke T 06/12/2011, 10:22 AM

## 2011-06-12 NOTE — Telephone Encounter (Signed)
Pr e-mail from Basye, I have scheduled treamtent appts. Appts in computer and Jacki Cones aware. JMW

## 2011-06-12 NOTE — Telephone Encounter (Signed)
sent michelle email to set up for treatment for05-21-2013 07-17-2011

## 2011-06-12 NOTE — Progress Notes (Signed)
Patinet admitted to 3rd floor Oncology for febrile neutropenia-CTS

## 2011-06-12 NOTE — Progress Notes (Signed)
Patient's pulse rate on continuous pulse ox. Mid 130's - mid 140's/ standing pulse 150.  IVF NS @ 150, Temp 101.5 tylenol given. Antibiotics started.  Called Dr. Darnelle Catalan to notify.  MD stated to monitor the patient closely for the next two hours  if pulse is still high contact MD.

## 2011-06-12 NOTE — Progress Notes (Signed)
Abigail [name]   DOB:15-Sep-1955   RU#:[id number]   BJY#:[id number]  Subjective: She is feeling better--was "chilled" before--no rigors; Denies ST, cough, phlegm, SOB, dysuria, frequency, or diarrhea; no rash or bleeding. Patient joking with multiple family in room   Objective: middle aged African American woman examined in bed Filed Vitals:   06/12/11 1757  BP: 123/78  Pulse: 135  Temp: 100.9 F (38.3 C)  Resp: 20    Body mass index is 39.95 kg/(m^2).  Intake/Output Summary (Last 24 hours) at 06/12/11 1859 Last data filed at 06/12/11 1802  Gross per 24 hour  Intake   1190 ml  Output    320 ml  Net    870 ml    Oropharynx clear  No peripheral adenopathy  Lungs clear -- no rales or rhonchi  Heart rapid rate, regular rhythm  Abdomen benign  MSK no focal spinal tenderness, no peripheral edema  Neuro nonfocal: A&O x3,    CBG (last 3)  No results found for this basename: GLUCAP:3 in the last 72 hours   Labs:  Lab Results  Component Value Date   WBC 0.6* 06/12/2011   HGB 12.4 06/12/2011   HCT 35.4 06/12/2011   MCV 102.6* 06/12/2011   PLT 201 06/12/2011   NEUTROABS 0.2* 06/12/2011     Lab 06/12/11 1004  NA 131*  K 4.3  CL 93*  CO2 27  GLUCOSE 126*  BUN 21  CREATININE 0.82  CALCIUM 8.9  MG --     Urine Studies No results found for this basename: UACOL:2,UAPR:2,USPG:2,UPH:2,UTP:2,UGL:2,UKET:2,UBIL:2,UHGB:2,UNIT:2,UROB:2,ULEU:2,UEPI:2,UWBC:2,URBC:2,UBAC:2,CAST:2,CRYS:2,UCOM:2,BILUA:2 in the last 72 hours     Studies:  X-ray Chest Pa And Lateral   06/12/2011  *RADIOLOGY REPORT*  Clinical Data: Fever.  Neutropenia.  CHEST - 2 VIEW  Comparison: Chest CT 10/24/2010.  Findings:  The heart is mildly enlarged but stable.  A right-sided power port is in good position.  There is streaky bibasilar atelectasis but no effusions or pneumothorax.  Apical pleural thickening is noted on the left.  IMPRESSION: Stable cardiac enlargement. Streaky bibasilar atelectasis.  Original Report  Authenticated By: P. Loralie Champagne, M.D.    Assessment: 55 year old Abigail [name] woman with recurrent fallopian tube carcinoma, day 7 cycle 1 carboplatin/taxol, admitted with fever and neutropenia.    Plan: Fever is finally coming down and with it the heart rate; blood pressure is stable and she is making urine;  have added antiemetics and lovenox.; anticipate 72-hor stay +/- 1-2 days.   Abigail [name] C 06/12/2011

## 2011-06-13 LAB — URINE CULTURE
Colony Count: NO GROWTH
Culture: NO GROWTH

## 2011-06-13 LAB — COMPREHENSIVE METABOLIC PANEL
ALT: 18 U/L (ref 0–35)
AST: 14 U/L (ref 0–37)
Albumin: 2.4 g/dL — ABNORMAL LOW (ref 3.5–5.2)
Alkaline Phosphatase: 61 U/L (ref 39–117)
Calcium: 8.3 mg/dL — ABNORMAL LOW (ref 8.4–10.5)
GFR calc Af Amer: >90 mL/min (ref >90)
Potassium: 3.7 mEq/L (ref 3.5–5.1)
Sodium: 128 mEq/L — ABNORMAL LOW (ref 135–145)
Total Protein: 6.2 g/dL (ref 6.0–8.3)

## 2011-06-13 LAB — CBC
MCH: 36.1 pg — ABNORMAL HIGH (ref 26.0–34.0)
MCHC: 35 g/dL (ref 30.0–36.0)
Platelets: 144 10*3/uL — ABNORMAL LOW (ref 150–400)
RDW: 15 % (ref 11.5–15.5)

## 2011-06-13 MED ORDER — SODIUM CHLORIDE 0.9 % IV SOLN
500.0000 mg | Freq: Four times a day (QID) | INTRAVENOUS | Status: DC
  Administered 2011-06-13 – 2011-06-16 (×12): 500 mg via INTRAVENOUS
  Filled 2011-06-13 (×16): qty 500

## 2011-06-13 NOTE — H&P (Signed)
H&P     Abigail [name] is an 56 y.o. female.    Chief Complaint: Currently day 7 cycle one of palliative q. 3 week carboplatin/Taxol for recurrent fallopian tube carcinoma, noted to be neutropenic with fever. HPI: Abigail [name] is a 56 year old [name] who is actively being treated for recurrent fallopian tube carcinoma in the outpatient setting. She presented to our office today for routine assessment specifically day 7 cycle 1 of palliative every 3 week carboplatin/Taxol. She did not receive Neulasta on day 2. She states except for some low-grade nausea which came ago she felt pretty well until yesterday evening when she began having fevers and chills/riders. This morning, she experienced nausea and emesis x1. She has also had "loose" stools. Upon assessment today, she was sent to have a fever 102.6, total white blood cell count of 0.6 with an ANC 0.2. She denies any known exposure to any other illness. She denies shortness of breath or chest pain but she does have easy fatigability. She did have some mild dysuria and urgency symptoms, she is staying well hydrated. She denies any headaches, sore throat, or mouth sores. She denies a diffuse bone pain. She has had some abdominal discomfort. No pain over her prior right inguinal lymph node biopsy site.    Past Medical History   Diagnosis  Date   .  Hypertension     .  Obesity     .  Diverticulitis     .  Hemorrhoids     .  Colonic polyp     .  GERD (gastroesophageal reflux disease)     .  Cancer         BREAST, FALLOPIAN TUBE,         Past Surgical History   Procedure  Date   .  Tonsillectomy and adenoidectomy     .  Breast lumpectomy     .  Abdominal hysterectomy         TAH, BSO   .  Port a cath         05/29/11         Family History   Problem  Relation  Age of Onset   .  Cancer  Mother     .  Hypertension  Mother     .  Diabetes  Father     .  Heart disease  Father     .  Hypertension  Father       .  Stroke  Father     .  Hypertension  Sister     .  Hypertension  Brother     .  Hypertension  Paternal Aunt     .  Diabetes  Paternal Aunt     .  Hypertension  Paternal Uncle     .  Diabetes  Paternal Uncle     .  Hypertension  Paternal Grandmother     .  Hypertension  Paternal Grandfather        Social History:  reports that she quit smoking about 4 years ago. Her smoking use included Cigarettes. She does not have any smokeless tobacco history on file. She reports that she drinks alcohol. Her drug history not on file.   Allergies:  Allergies   Allergen  Reactions   .  Codeine  Itching   .  Oxycodone  Nausea Only   .  Vicodin (Hydrocodone-Acetaminophen)  Nausea Only          (  Not in a hospital admission)    Results for orders placed in visit on 06/12/11 (from the past 48 hour(s))   CBC WITH DIFFERENTIAL     Status: Abnormal     Collection Time     06/12/11  9:07 AM       Component  Value  Range  Comment     WBC  0.6 (*)  3.9 - 10.3 (10e3/uL)       NEUT#  0.2 (*)  1.5 - 6.5 (10e3/uL)       HGB  12.4   11.6 - 15.9 (g/dL)       HCT  86.5   78.4 - 46.6 (%)       Platelets  201   145 - 400 (10e3/uL)       MCV  102.6 (*)  79.5 - 101.0 (fL)       MCH  35.9 (*)  25.1 - 34.0 (pg)       MCHC  35.0   31.5 - 36.0 (g/dL)       RBC  6.96 (*)  2.95 - 5.45 (10e6/uL)       RDW  15.1 (*)  11.2 - 14.5 (%)       lymph#  0.4 (*)  0.9 - 3.3 (10e3/uL)       MONO#  0.0 (*)  0.1 - 0.9 (10e3/uL)       Eosinophils Absolute  0.0   0.0 - 0.5 (10e3/uL)       Basophils Absolute  0.0   0.0 - 0.1 (10e3/uL)       NEUT%  28.0 (*)  38.4 - 76.8 (%)       LYMPH%  66.7 (*)  14.0 - 49.7 (%)       MONO%  1.8   0.0 - 14.0 (%)       EOS%  3.5   0.0 - 7.0 (%)       BASO%  0.0   0.0 - 2.0 (%)       nRBC  0   0 - 0 (%)        No results found.   Review of Systems  Constitutional: Positive for fever, chills and malaise/fatigue.  HENT: Negative.   Eyes: Negative.   Respiratory: Negative.    Cardiovascular: Positive for palpitations.  Gastrointestinal: Positive for nausea, vomiting, abdominal pain and diarrhea.  Genitourinary: Positive for dysuria, urgency and frequency.  Musculoskeletal: Positive for myalgias.  Skin: Negative.   Neurological: Positive for weakness.  Endo/Heme/Allergies: Negative.   Psychiatric/Behavioral: Negative.       Blood pressure 134/91, pulse 144, temperature 102.6 F (39.2 C), temperature source Oral, height 5\' 1"  (1.549 m), weight 208 lb 12.8 oz (94.711 kg). Physical Exam  Constitutional: She is oriented to person, place, and time. She appears well-developed and well-nourished.  HENT:   Head: Normocephalic and atraumatic.   Mouth/Throat: Oropharynx is clear and moist.  Eyes: Conjunctivae and EOM are normal. Pupils are equal, round, and reactive to light.  Neck: Normal range of motion. Neck supple. No JVD present. No tracheal deviation present. No thyromegaly present.  Cardiovascular: Regular rhythm.  Exam reveals no gallop and no friction rub.    No murmur heard.      Tachycardic rate approx. 140  Respiratory: Effort normal. No stridor. No respiratory distress. She has no wheezes. She has no rales. She exhibits no tenderness.       Slight evidence of bronchial sounds.  GI: Soft. Bowel  sounds are normal. She exhibits no distension and no mass. There is tenderness. There is no rebound and no guarding.  Musculoskeletal: Normal range of motion. She exhibits no edema and no tenderness.  Lymphadenopathy:    She has no cervical adenopathy.  Neurological: She is alert and oriented to person, place, and time. She displays normal reflexes. No cranial nerve deficit. She exhibits normal muscle tone. Coordination normal.  Skin: Skin is warm and dry.  Psychiatric: She has a normal mood and affect. Her behavior is normal. Judgment and thought content normal.      Assessment/Plan A 56 year old [place] woman with recurrent fallopian tube  carcinoma now being treated in the palliative setting currently day 7 cycle one of every three-week carboplatin/Taxol being given in the rechallenge setting. She presented to our office today for routine assessment and was noted to have a total white blood cell count of 0.6 with an ANC of 0.2, she was noted to be febrile with temperature 102.6 and tachycardic rate of 144. Blood pressure was slightly hypertensive, of note she has a history of hypertension, but she did not take her antihypertensives this morning. After assessment with Dr. Marikay Alar Autymn Omlor who is covering for Dr. Pierce Crane in his absence, it was felt prudent for pancultures to be obtained, therefore blood culture times one via her port was obtained in the outpatient setting, she was admitted to the oncology floor for IV antibiotic coverage to include Primaxin 500 mg q. 6 hours, and vancomycin to be dosed via pharmacy. Chest x-ray will also be obtained. It is noted that patient is a full code. She will also be covered with Neupogen 480 mcg subcutaneous first dose today for the next 5 days with CBCs and chemistry panels been obtained daily.

## 2011-06-13 NOTE — Progress Notes (Signed)
ANTIBIOTIC CONSULT NOTE - FOLLOW UP  Pharmacy Consult for vanc and primaxin Indication: Febrile neutropenia  Allergies  Allergen Reactions  . Codeine Itching  . Oxycodone Nausea Only  . Vicodin (Hydrocodone-Acetaminophen) Nausea Only    Patient Measurements: Height: 5\' 1"  (154.9 cm) Weight: 214 lb 11.7 oz (97.4 kg) IBW/kg (Calculated) : 47.8   Vital Signs: Temp: 100.7 F (38.2 C) (05/08 0501) Temp src: Oral (05/08 0501) BP: 116/66 mmHg (05/08 0501) Pulse Rate: 112  (05/08 0501) Intake/Output from previous day: 05/07 0701 - 05/08 0700 In: 3127 [P.O.:240; I.V.:2537; IV Piggyback:350] Out: 770 [Urine:770] Intake/Output from this shift: Total I/O In: -  Out: 400 [Urine:400]  Labs:  [facility] 06/13/11 0545 06/12/11 1004 06/12/11 0907  WBC 0.5* -- 0.6*  HGB 10.3* -- 12.4  PLT 144* -- 201  LABCREA -- -- --  CREATININE 0.79 0.82 --   Estimated Creatinine Clearance: 82.8 ml/min (by C-G formula based on Cr of 0.79). Normalized CrCl 79ml/min/1.73m2  Microbiology: No results found for this or any previous visit (from the past 720 hour(s)).  Anti-infectives     Start     Dose/Rate Route Frequency Ordered Stop   06/13/11 1200   imipenem-cilastatin (PRIMAXIN) 500 mg in sodium chloride 0.9 % 100 mL IVPB        500 mg 200 mL/hr over 30 Minutes Intravenous 4 times per day 06/13/11 0942     06/12/11 1400   imipenem-cilastatin (PRIMAXIN) 500 mg in sodium chloride 0.9 % 100 mL IVPB  Status:  Discontinued        500 mg 200 mL/hr over 30 Minutes Intravenous 3 times per day 06/12/11 1131 06/13/11 0942   06/12/11 1300   vancomycin (VANCOCIN) 1,250 mg in sodium chloride 0.9 % 250 mL IVPB        1,250 mg 166.7 mL/hr over 90 Minutes Intravenous Every 12 hours 06/12/11 1151     06/12/11 1131   vancomycin (VANCOCIN) 500 mg in sodium chloride 0.9 % 100 mL IVPB  Status:  Discontinued        500 mg 100 mL/hr over 60 Minutes Intravenous Every 6 hours 06/12/11 1131 06/12/11 1150          Assessment: 55 YOF D2 Primaxin 500mg  IV q8h and Vanc 1250mg  IV q12h for febrile neutropenia Scr wnl, stable Received TO from Dr Darnelle Catalan to adjust Primaxin dose also in addition to Vanc management  Goal of Therapy:  Vancomycin trough level 15-20 mcg/ml Appropriate Primaxin dose  Plan:   Increase Primaxin to 500mg  IV q6h based on wt of 97kg and CrCl N of 64ml/min/1.73m2  Continue current Vanc dose.   Plan Vanc trough at steady state if necessary  Adjust doses as appropriate.  Gwen Her PharmD  [phone] 06/13/2011 9:45 AM

## 2011-06-13 NOTE — Progress Notes (Signed)
Pt. Spiked a temperature around 9pm (102.7) pt also felt nauseated.  She was given 2 tylenol and a phenergan tablet.  MD was here to see patient at shift change for elevated fevers and nausea.  Fever went down to 99.3 one hour after tylenol given.  Will continue to monitor patient tonight.Arvilla Market, Relena Ivancic [place]

## 2011-06-13 NOTE — Progress Notes (Signed)
CRITICAL VALUE ALERT  Critical value received: WBC 0.5  Date of notification: 06/13/11  Time of notification:  0700  Critical value read back:yes  Nurse who received alert: Kenton Kingfisher, RN  MD notified (1st page): N/a; results are consistent with yesterday's results (yesterday wbc 0.6)

## 2011-06-13 NOTE — Progress Notes (Signed)
Abigail [name]   DOB:09-13-1955   JW#:[id number]   FAO#:[id number]  Subjective: "I feel better today."Has new-onset diarrhea. Denies nausea, vomiting, cough, shortness of breath, dysuria, or other localizing symptoms. No family in room.   Objective: middle aged [place] American woman examined sitting up by bedsid and e Filed Vitals:   06/13/11 0501  BP: 116/66  Pulse: 112  Temp: 100.7 F (38.2 [name])  Resp: 20    Body mass index is 40.57 kg/(m^2).  Intake/Output Summary (Last 24 hours) at 06/13/11 1228 Last data filed at 06/13/11 6962  Gross per 24 hour  Intake   2907 ml  Output   1170 ml  Net   1737 ml    Oropharynx clear  No peripheral adenopathy  Lungs clear -- no rales or rhonchi  Heart still tachycardic but rate coming do awn, regular rhythm  Abdomen soft, [place], +BS, no masses palpated a   MSK no focal spinal tenderness, no peripheral edema  Neuro nonfocal: A&O x3,    CBG (last 3)  No results found for this basename: GLUCAP:3 in the last 72 hours   Labs:  Lab Results  Component Value Date   WBC 0.5* 06/13/2011   HGB 10.3* 06/13/2011   HCT 29.4* 06/13/2011   MCV 103.2* 06/13/2011   PLT 144* 06/13/2011   NEUTROABS 0.2* 06/12/2011     Lab 06/13/11 0545 06/12/11 1004  NA 128* 131*  K 3.7 4.3  CL 96 93*  CO2 24 27  GLUCOSE 102* 126*  BUN 15 21  CREATININE 0.79 0.82  CALCIUM 8.3* 8.9  MG -- --     Urine Studies No results found for this basename: UACOL:2,UAPR:2,USPG:2,UPH:2,UTP:2,UGL:2,UKET:2,UBIL:2,UHGB:2,UNIT:2,UROB:2,ULEU:2,UEPI:2,UWBC:2,URBC:2,UBAC:2,CAST:2,CRYS:2,UCOM:2,BILUA:2 in the last 72 hours     Studies:  X-ray Chest Pa And Lateral   06/12/2011  *RADIOLOGY REPORT*  Clinical Data: Fever.  Neutropenia.  CHEST - 2 VIEW  Comparison: Chest CT 10/24/2010.  Findings:  The heart is mildly enlarged but stable.  A right-sided power port is in good position.  There is streaky bibasilar atelectasis but no effusions or pneumothorax.  Apical pleural thickening is noted on  the left.  IMPRESSION: Stable cardiac enlargement. Streaky bibasilar atelectasis.  Original Report Authenticated By: P. Loralie Champagne, M.D.    Assessment: 56 year old Abigail [name] woman with recurrent fallopian tube carcinoma, day 8 1 will will  cycle 1 carboplatin/taxol, admitted with fever and neutropenia.    Plan: Heart rate continues to and a normalize as temperature comes down. Will send stool for [name] diff., add  Questran; still neutropenic and, will continue neupogen and follow counts; cultures negative to date; encouraged ambulation  Abigail [name] 06/13/2011

## 2011-06-14 NOTE — Progress Notes (Signed)
Abigail [name]   DOB:03/09/1955   WU#:[id number]   [id number]#:[id number]  Subjective: Diarrhea has resolved; not walking in halls today yet; denies headaches, cough, SOB, chills, dysuria, rash or bleeding. Tandy Gaw in room  Objective: middle aged African American woman examined sitting up eating lunch Filed Vitals:   06/14/11 0505  BP: 100/56  Pulse: 97  Temp: 99.1 F (37.3 C)  Resp: 16    Body mass index is 38.99 kg/(m^2).  Intake/Output Summary (Last 24 hours) at 06/14/11 1249 Last data filed at 06/14/11 0504  Gross per 24 hour  Intake 1022.5 ml  Output   1250 ml  Net -227.5 ml    Oropharynx clear  No peripheral adenopathy  Lungs clear -- no rales or rhonchi  Heart RRR  Abdomen soft, NY, +BS, no masses palpated    MSK no focal spinal tenderness, .  Neuro nonfocal: A&O x3,    CBG (last 3)  No results found for this basename: GLUCAP:3 in the last 72 hours   Labs:  Lab Results  Component Value Date   WBC 0.5* 06/13/2011   HGB 10.3* 06/13/2011   HCT 29.4* 06/13/2011   MCV 103.2* 06/13/2011   PLT 144* 06/13/2011   NEUTROABS 0.2* 06/12/2011     Lab 06/13/11 0545 06/12/11 1004  NA 128* 131*  K 3.7 4.3  CL 96 93*  CO2 24 27  GLUCOSE 102* 126*  BUN 15 21  CREATININE 0.79 0.82  CALCIUM 8.3* 8.9  MG -- --     Urine Studies No results found for this basename: UACOL:2,UAPR:2,USPG:2,UPH:2,UTP:2,UGL:2,UKET:2,UBIL:2,UHGB:2,UNIT:2,UROB:2,ULEU:2,UEPI:2,UWBC:2,URBC:2,UBAC:2,CAST:2,CRYS:2,UCOM:2,BILUA:2 in the last 72 hours     Studies:  No results found.  Assessment: 56 year old [place] woman with recurrent fallopian tube carcinoma, day 9 cycle 1 carboplatin/taxol, admitted with fever and neutropenia.    Plan: stable, with normal blood pressure off anti-hypertensives, heart rate now <100; diarrhea resolved; cultures negative so far. Will reckeck CBC in AM; plan on discharge as soon as ANC recovered. Rossie Bretado C 06/14/2011

## 2011-06-15 LAB — DIFFERENTIAL
Basophils Absolute: 0 10*3/uL (ref 0.0–0.1)
Eosinophils Relative: 1 % (ref 0–5)
Lymphs Abs: 0.6 10*3/uL — ABNORMAL LOW (ref 0.7–4.0)
Monocytes Absolute: 0.3 10*3/uL (ref 0.1–1.0)
Monocytes Relative: 31 % — ABNORMAL HIGH (ref 3–12)
Neutrophils Relative %: 16 % — ABNORMAL LOW (ref 43–77)

## 2011-06-15 LAB — COMPREHENSIVE METABOLIC PANEL
Albumin: 2.4 g/dL — ABNORMAL LOW (ref 3.5–5.2)
BUN: 7 mg/dL (ref 6–23)
Calcium: 8 mg/dL — ABNORMAL LOW (ref 8.4–10.5)
GFR calc Af Amer: >90 mL/min (ref >90)
Glucose, Bld: 90 mg/dL (ref 70–99)
Total Protein: 6 g/dL (ref 6.0–8.3)

## 2011-06-15 LAB — CBC
MCH: 35.2 pg — ABNORMAL HIGH (ref 26.0–34.0)
MCV: 102.7 fL — ABNORMAL HIGH (ref 78.0–100.0)
Platelets: 104 10*3/uL — ABNORMAL LOW (ref 150–400)
RBC: 2.56 MIL/uL — ABNORMAL LOW (ref 3.87–5.11)
RDW: 14.4 % (ref 11.5–15.5)
WBC: 1.1 10*3/uL — CL (ref 4.0–10.5)

## 2011-06-15 MED ORDER — POTASSIUM CHLORIDE CRYS ER 20 MEQ PO TBCR
20.0000 meq | EXTENDED_RELEASE_TABLET | Freq: Two times a day (BID) | ORAL | Status: DC
  Administered 2011-06-15 – 2011-06-16 (×3): 20 meq via ORAL
  Filled 2011-06-15 (×4): qty 1

## 2011-06-15 NOTE — Progress Notes (Signed)
Jorita Bohanon   DOB:[birth year]   ZO#:[id number]   [id number]#:[id number]  Subjective: Ambulated x3 yesterday; two BMs past 24h, not runny; slight nausea, no vomiting; rest of ROS negative  Objective: middle aged [place] American woman examined sitting up in bed Filed Vitals:   06/15/11 0552  BP: 121/85  Pulse: 96  Temp: 99.4 F (37.4 C)  Resp: 16    Body mass index is 40.41 kg/(m^2).  Intake/Output Summary (Last 24 hours) at 06/15/11 0731 Last data filed at 06/15/11 0154  Gross per 24 hour  Intake   1260 ml  Output   1050 ml  Net    210 ml    Oropharynx clear  No peripheral adenopathy  Lungs clear -- no rales or rhonchi  Heart RRR  Abdomen soft, NY, +BS, no masses palpated    MSK no focal spinal tenderness, .  Neuro nonfocal: A&O x3,    CBG (last 3)  No results found for this basename: GLUCAP:3 in the last 72 hours   Labs:  Lab Results  Component Value Date   WBC 1.1* 06/15/2011   HGB 9.0* 06/15/2011   HCT 26.3* 06/15/2011   MCV 102.7* 06/15/2011   PLT 104* 06/15/2011   NEUTROABS 0.2* 06/15/2011     Lab 06/15/11 0500 06/13/11 0545 06/12/11 1004  NA 130* 128* 131*  K 3.3* 3.7 4.3  CL 98 96 93*  CO2 23 24 27   GLUCOSE 90 102* 126*  BUN 7 15 21   CREATININE 0.62 0.79 0.82  CALCIUM 8.0* 8.3* 8.9  MG -- -- --     Urine Studies No results found for this basename: UACOL:2,UAPR:2,USPG:2,UPH:2,UTP:2,UGL:2,UKET:2,UBIL:2,UHGB:2,UNIT:2,UROB:2,ULEU:2,UEPI:2,UWBC:2,URBC:2,UBAC:2,CAST:2,CRYS:2,UCOM:2,BILUA:2 in the last 72 hours     Studies:  Cultures remain negative  Assessment: 56 year old [place] woman with recurrent fallopian tube carcinoma, day 10 cycle 1 carboplatin/taxol, admitted with fever and neutropenia.    Plan: counts are recovering; she very likely will be ready for discharge tomorrow; will correct K+, repeat labs in AM; note she already has follow-up with Dr Donnie Coffin 5/20  Ruthann Cancer C 06/15/2011

## 2011-06-15 NOTE — Progress Notes (Signed)
ANTIBIOTIC CONSULT NOTE - FOLLOW UP  Pharmacy Consult for vanc and primaxin Indication: Febrile neutropenia  Allergies  Allergen Reactions  . Codeine Itching  . Oxycodone Nausea Only  . Vicodin (Hydrocodone-Acetaminophen) Nausea Only    Patient Measurements: Height: 5\' 1"  (154.9 cm) Weight: 213 lb 13.5 oz (97 kg) IBW/kg (Calculated) : 47.8   Vital Signs: Temp: 99.4 F (37.4 C) (05/10 0552) Temp src: Oral (05/10 0552) BP: 121/85 mmHg (05/10 0552) Pulse Rate: 96  (05/10 0552) Intake/Output from previous day: 05/09 0701 - 05/10 0700 In: 1260 [P.O.:360; I.V.:600; IV Piggyback:300] Out: 1050 [Urine:1050] Intake/Output from this shift:    Labs:  Basename 06/15/11 0500 06/13/11 0545 06/12/11 1004 06/12/11 0907  WBC 1.1* 0.5* -- 0.6*  HGB 9.0* 10.3* -- 12.4  PLT 104* 144* -- 201  LABCREA -- -- -- --  CREATININE 0.62 0.79 0.82 --   Estimated Creatinine Clearance: 82.7 ml/min (by C-G formula based on Cr of 0.62). Normalized CrCl 41ml/min/1.73m2  Microbiology: Recent Results (from the past 720 hour(s))  URINE CULTURE     Status: Normal   Collection Time   06/12/11  3:04 PM      Component Value Range Status Comment   Specimen Description URINE, CLEAN CATCH   Final    Special Requests none Immunocompromised   Final    Culture  Setup Time [id number]   Final    Colony Count NO GROWTH   Final    Culture NO GROWTH   Final    Report Status 06/13/2011 FINAL   Final     Anti-infectives     Start     Dose/Rate Route Frequency Ordered Stop   06/13/11 1200   imipenem-cilastatin (PRIMAXIN) 500 mg in sodium chloride 0.9 % 100 mL IVPB        500 mg 200 mL/hr over 30 Minutes Intravenous 4 times per day 06/13/11 0942     06/12/11 1400   imipenem-cilastatin (PRIMAXIN) 500 mg in sodium chloride 0.9 % 100 mL IVPB  Status:  Discontinued        500 mg 200 mL/hr over 30 Minutes Intravenous 3 times per day 06/12/11 1131 06/13/11 0942   06/12/11 1300   vancomycin (VANCOCIN) 1,250 mg in  sodium chloride 0.9 % 250 mL IVPB        1,250 mg 166.7 mL/hr over 90 Minutes Intravenous Every 12 hours 06/12/11 1151     06/12/11 1131   vancomycin (VANCOCIN) 500 mg in sodium chloride 0.9 % 100 mL IVPB  Status:  Discontinued        500 mg 100 mL/hr over 60 Minutes Intravenous Every 6 hours 06/12/11 1131 06/12/11 1150          Assessment:  55 YOF D4 Primaxin 500mg  IV q6h and Vanc 1250mg  IV q12h for febrile neutropenia  Scr wnl, stable  WBC increasing, Tm 99.4  Goal of Therapy:  Vancomycin trough level 15-20 mcg/ml Appropriate Primaxin dose  Plan:   Continue Vancomycin 1250mg  IV q12h and Primaxin 500mg  IV q6h   Will defer Vanc trough since expected discharge tomorrow  Continue to monitor  Gwen Her PharmD  [phone] 06/15/2011 8:00 AM

## 2011-06-16 DIAGNOSIS — C5700 Malignant neoplasm of unspecified fallopian tube: Secondary | ICD-10-CM

## 2011-06-16 DIAGNOSIS — D709 Neutropenia, unspecified: Secondary | ICD-10-CM | POA: Diagnosis present

## 2011-06-16 DIAGNOSIS — R5081 Fever presenting with conditions classified elsewhere: Secondary | ICD-10-CM

## 2011-06-16 LAB — CBC
HCT: 25.6 % — ABNORMAL LOW (ref 36.0–46.0)
Hemoglobin: 8.8 g/dL — ABNORMAL LOW (ref 12.0–15.0)
MCV: 104.1 fL — ABNORMAL HIGH (ref 78.0–100.0)
RBC: 2.46 MIL/uL — ABNORMAL LOW (ref 3.87–5.11)
WBC: 3.1 10*3/uL — ABNORMAL LOW (ref 4.0–10.5)

## 2011-06-16 LAB — BASIC METABOLIC PANEL
BUN: 5 mg/dL — ABNORMAL LOW (ref 6–23)
CO2: 23 mEq/L (ref 19–32)
Chloride: 101 mEq/L (ref 96–112)
Creatinine, Ser: 0.61 mg/dL (ref 0.50–1.10)
Glucose, Bld: 89 mg/dL (ref 70–99)

## 2011-06-16 LAB — DIFFERENTIAL
Basophils Absolute: 0 10*3/uL (ref 0.0–0.1)
Lymphocytes Relative: 25 % (ref 12–46)
Lymphs Abs: 0.8 10*3/uL (ref 0.7–4.0)
Monocytes Absolute: 0.5 10*3/uL (ref 0.1–1.0)
Monocytes Relative: 17 % — ABNORMAL HIGH (ref 3–12)
Neutro Abs: 1.8 10*3/uL (ref 1.7–7.7)

## 2011-06-16 MED ORDER — HEPARIN SOD (PORK) LOCK FLUSH 100 UNIT/ML IV SOLN
INTRAVENOUS | Status: AC
  Filled 2011-06-16: qty 5

## 2011-06-16 MED ORDER — MOXIFLOXACIN HCL 400 MG PO TABS: 400.0000 mg | ORAL_TABLET | Freq: Every day | ORAL | Status: AC

## 2011-06-16 MED ORDER — AMLODIPINE BESYLATE 10 MG PO TABS
10.0000 mg | ORAL_TABLET | Freq: Every day | ORAL | Status: DC
  Administered 2011-06-16: 10 mg via ORAL
  Filled 2011-06-16: qty 1

## 2011-06-16 NOTE — Discharge Summary (Signed)
  Physician Discharge Summary  Patient ID: Abigail [name] MRN: [id number] DOB/AGE: [birth year] 56 y.o.  Admit date: 06/12/2011 Discharge date: 06/18/2011  Admission Diagnoses: #1. Febrile neutropenia                                         #2. Relapsed fallopian tube cancer Hospital Course:   56 year old woman with recent recurrence of fallopian tube cancer who started chemotherapy  With carboplatinum plus taxol who presented on day of admission with nausea, vomiting, loose stools and fever to 102.6 and was found to be neutropenic with total WBC only 0.6. Cultures were obtained and she was started on broad-spectrum parenteral antibiotics. She was started on a course of Neupogen to accelerate white count recovery. Fevers resolved promptly on antibiotic treatment and there was a rapid and steady rise in her total white blood count to 3100 with 57% neutrophils and 17% monocytes by time of discharge on may 11. Discharge Labs:   Lab 06/16/11 0355 06/15/11 0500 06/13/11 0545 06/12/11 0907  WBC 3.1* 1.1* 0.5* --  HGB 8.8* 9.0* 10.3* --  HCT 25.6* 26.3* 29.4* --  PLT 99* 104* 144* --  NEUTOPHILPCT 57 16* -- 28.0*  MONOPCT 17* 31* -- 1.8    Lab 06/16/11 0355 06/15/11 0500 06/13/11 0545 06/12/11 1004  NA 134* 130* 128* --  K 3.5 3.3* 3.7 --  CL 101 98 96 --  CO2 23 23 24  --  BUN 5* 7 15 --  CREATININE 0.61 0.62 0.79 --  CALCIUM 7.8* 8.0* 8.3* --  PROT -- 6.0 6.2 6.8  BILITOT -- 0.6 0.9 1.3*  ALKPHOS -- 63 61 79  ALT -- 15 18 28   AST -- 14 14 19   GLUCOSE 89 90 102* --       Consults: None   Procedures: None   Discharge Diagnoses: Active Problems: #1. Neutropenia with fever #2. Relapsed fallopian tube cancer  Disposition: Condition stable at time of discharge. She is afebrile. She will resume regular activity. Regular diet. Followup with Dr. Donnie Coffin on May 20. I'm going to continue oral antibiotics as an outpatient with Avelox 400 mg daily x7 more days. She's advised to call for  any recurrent fever. Additional medications: Norvasc 10 mg daily. Zestoretic 10-12.5 mg daily. Lorazepam 0.5 mg every 6 hours when necessary nausea or vomiting. Zofran 8 mg q. 8 hours when necessary nausea or vomiting. Compazine 10 mg every 6 hours when necessary nausea vomiting. Vitamin D3 3000 units daily.       SignedLevert Feinstein 06/18/2011, 8:56 AM  stools and fever to 102.6 and was found to be neutropenic with total WBC only 0.6

## 2011-06-26 ENCOUNTER — Ambulatory Visit (HOSPITAL_BASED_OUTPATIENT_CLINIC_OR_DEPARTMENT_OTHER): Payer: Medicare Other | Admitting: Oncology

## 2011-06-26 ENCOUNTER — Telehealth: Payer: Self-pay | Admitting: *Deleted

## 2011-06-26 ENCOUNTER — Ambulatory Visit (HOSPITAL_BASED_OUTPATIENT_CLINIC_OR_DEPARTMENT_OTHER): Payer: Medicare Other

## 2011-06-26 ENCOUNTER — Other Ambulatory Visit (HOSPITAL_BASED_OUTPATIENT_CLINIC_OR_DEPARTMENT_OTHER): Payer: Medicare Other | Admitting: Lab

## 2011-06-26 VITALS — BP 104/70 | HR 106 | Temp 97.4°F

## 2011-06-26 VITALS — BP 123/79 | HR 125 | Temp 98.7°F | Ht 61.0 in | Wt 213.0 lb

## 2011-06-26 DIAGNOSIS — Z1501 Genetic susceptibility to malignant neoplasm of breast: Secondary | ICD-10-CM

## 2011-06-26 DIAGNOSIS — C5700 Malignant neoplasm of unspecified fallopian tube: Secondary | ICD-10-CM

## 2011-06-26 DIAGNOSIS — Z5111 Encounter for antineoplastic chemotherapy: Secondary | ICD-10-CM

## 2011-06-26 LAB — CBC WITH DIFFERENTIAL/PLATELET
BASO%: 0.2 % (ref 0.0–2.0)
HCT: 27.1 % — ABNORMAL LOW (ref 34.8–46.6)
LYMPH%: 27.5 % (ref 14.0–49.7)
MCHC: 33.9 g/dL (ref 31.5–36.0)
MCV: 104.2 fL — ABNORMAL HIGH (ref 79.5–101.0)
MONO#: 0.8 10*3/uL (ref 0.1–0.9)
NEUT%: 57 % (ref 38.4–76.8)
Platelets: 362 10*3/uL (ref 145–400)
WBC: 5.4 10*3/uL (ref 3.9–10.3)

## 2011-06-26 LAB — COMPREHENSIVE METABOLIC PANEL
CO2: 21 mEq/L (ref 19–32)
Creatinine, Ser: 0.76 mg/dL (ref 0.50–1.10)
Glucose, Bld: 99 mg/dL (ref 70–99)
Total Bilirubin: 0.3 mg/dL (ref 0.3–1.2)

## 2011-06-26 LAB — LACTATE DEHYDROGENASE: LDH: 183 U/L (ref 94–250)

## 2011-06-26 LAB — CA 125: CA 125: 109 U/mL — ABNORMAL HIGH (ref 0.0–30.2)

## 2011-06-26 MED ORDER — ONDANSETRON 16 MG/50ML IVPB (CHCC)
16.0000 mg | Freq: Once | INTRAVENOUS | Status: AC
  Administered 2011-06-26: 16 mg via INTRAVENOUS

## 2011-06-26 MED ORDER — SODIUM CHLORIDE 0.9 % IJ SOLN
10.0000 mL | INTRAMUSCULAR | Status: DC | PRN
  Filled 2011-06-26: qty 10

## 2011-06-26 MED ORDER — SODIUM CHLORIDE 0.9 % IJ SOLN
100.0000 ug | Freq: Once | INTRAVENOUS | Status: AC
  Administered 2011-06-26: 0.1 mg via INTRADERMAL
  Filled 2011-06-26: qty 0.01

## 2011-06-26 MED ORDER — SODIUM CHLORIDE 0.9 % IV SOLN
715.0000 mg | Freq: Once | INTRAVENOUS | Status: AC
  Administered 2011-06-26: 720 mg via INTRAVENOUS
  Filled 2011-06-26: qty 72

## 2011-06-26 MED ORDER — DIPHENHYDRAMINE HCL 50 MG/ML IJ SOLN
50.0000 mg | Freq: Once | INTRAMUSCULAR | Status: AC
  Administered 2011-06-26: 50 mg via INTRAVENOUS

## 2011-06-26 MED ORDER — PACLITAXEL CHEMO INJECTION 300 MG/50ML
175.0000 mg/m2 | Freq: Once | INTRAVENOUS | Status: AC
  Administered 2011-06-26: 360 mg via INTRAVENOUS
  Filled 2011-06-26: qty 60

## 2011-06-26 MED ORDER — FAMOTIDINE IN NACL 20-0.9 MG/50ML-% IV SOLN
20.0000 mg | Freq: Once | INTRAVENOUS | Status: AC
  Administered 2011-06-26: 20 mg via INTRAVENOUS

## 2011-06-26 MED ORDER — DEXAMETHASONE SODIUM PHOSPHATE 4 MG/ML IJ SOLN
20.0000 mg | Freq: Once | INTRAMUSCULAR | Status: AC
  Administered 2011-06-26: 20 mg via INTRAVENOUS

## 2011-06-26 MED ORDER — HEPARIN SOD (PORK) LOCK FLUSH 100 UNIT/ML IV SOLN
500.0000 [IU] | Freq: Once | INTRAVENOUS | Status: DC | PRN
  Filled 2011-06-26: qty 5

## 2011-06-26 MED ORDER — SODIUM CHLORIDE 0.9 % IV SOLN
Freq: Once | INTRAVENOUS | Status: AC
  Administered 2011-06-26: 12:00:00 via INTRAVENOUS

## 2011-06-26 NOTE — Telephone Encounter (Signed)
per orders from 06-26-2011 added injeciton onto the patient appointment for tomorrow

## 2011-06-26 NOTE — Progress Notes (Signed)
Hematology and Oncology Follow Up Visit  Abigail [name] [id number] [birth year] 56 y.o. 06/26/2011 10:55 AM PCP  Principle Diagnosis: 56 yo with hx of BRCA abnormality and previous history of breast and ovarian  Cancer, treated ~ 2 y ago, now with recurrent ovarian cancer s/p 1 cycle of carbo /taxol ~ 3 weeks ago. Due for day 1 C2 today.   Since being seen last, she was admitted with febrile neutropenia, 1 week fter chemo anfd has recovered. She did have fever and chills starrting 3-4 days after chemo. She is doing well now and has no complaints. She has minimal numbness /tingling in the hands and feet.  Medications:  No changes  Allergies:  Allergies  Allergen Reactions  . Codeine Itching  . Oxycodone Nausea Only  . Vicodin (Hydrocodone-Acetaminophen) Nausea Only    Past Medical History, Surgical history, Social history, and Family History were reviewed and updated.  Review of Systems: Constitutional:  Negative for fever, chills, night sweats, anorexia, weight loss, pain. Cardiovascular: no chest pain or dyspnea on exertion Respiratory: no cough, shortness of breath, or wheezing Neurological: no TIA or stroke symptoms Dermatological: negative ENT: negative Skin Gastrointestinal: no abdominal pain, change in bowel habits, or black or bloody stools Genito-Urinary: negative Hematological and Lymphatic: negative Breast: negative Musculoskeletal: negative Remaining ROS negative.  Physical Exam: Blood pressure 123/79, pulse 125, temperature 98.7 F (37.1 C), height 5\' 1"  (1.549 m), weight 213 lb (96.616 kg). ECOG: 1 General appearance: alert, cooperative and appears stated age Head: Normocephalic, without obvious abnormality, atraumatic Neck: no adenopathy, no carotid bruit, no JVD, supple, symmetrical, trachea midline and thyroid not enlarged, symmetric, no tenderness/mass/nodules Lymph nodes: Cervical, supraclavicular, and axillary nodes normal. Cardiac : regular rate and  rhythm, no murmurs or gallops Pulmonary:clear to auscultation bilaterally and normal percussion bilaterally Breasts: inspection negative, no nipple discharge or bleeding, no masses or nodularity palpable Abdomen:soft, non-tender; bowel sounds normal; no masses,  no organomegaly Extremities negative Neuro: alert, oriented, normal speech, no focal findings or movement disorder noted  Lab Results: Lab Results  Component Value Date   WBC 5.4 06/26/2011   HGB 9.2* 06/26/2011   HCT 27.1* 06/26/2011   MCV 104.2* 06/26/2011   PLT 362 06/26/2011     Chemistry      Component Value Date/Time   NA 134* 06/16/2011 0355   K 3.5 06/16/2011 0355   CL 101 06/16/2011 0355   CO2 23 06/16/2011 0355   BUN 5* 06/16/2011 0355   CREATININE 0.61 06/16/2011 0355      Component Value Date/Time   CALCIUM 7.8* 06/16/2011 0355   ALKPHOS 63 06/15/2011 0500   AST 14 06/15/2011 0500   ALT 15 06/15/2011 0500   BILITOT 0.6 06/15/2011 0500      .pathology. Radiological Studies: chest X-ray n/a Mammogram n/a Bone density n/a  Impression and Plan: Patient is doing well. He has tolerated her last cycle of chemotherapy. Her counts are adequate today. She has no numbness tingling. She will receive cycle 2 today and be seen in weeks time for a nadir check. She will receive Neulasta as well tomorrow.  More than 50% of the visit was spent in patient-related counselling   Pierce Crane, MD 5/21/201310:55 AM

## 2011-06-26 NOTE — Progress Notes (Signed)
1208- Carbo test dose site unremarkable. No signs of redness or swelling noted.  1218- Carbo test dose site unremarkable. No signs of redness or swelling noted.  1233- Carbo test dose site unremarkable. No signs of redness or swelling noted.

## 2011-06-27 ENCOUNTER — Ambulatory Visit (HOSPITAL_BASED_OUTPATIENT_CLINIC_OR_DEPARTMENT_OTHER): Payer: Medicare Other

## 2011-06-27 ENCOUNTER — Telehealth: Payer: Self-pay | Admitting: *Deleted

## 2011-06-27 VITALS — BP 135/83 | HR 125 | Temp 98.5°F

## 2011-06-27 DIAGNOSIS — C5700 Malignant neoplasm of unspecified fallopian tube: Secondary | ICD-10-CM

## 2011-06-27 DIAGNOSIS — R5081 Fever presenting with conditions classified elsewhere: Secondary | ICD-10-CM

## 2011-06-27 DIAGNOSIS — Z1501 Genetic susceptibility to malignant neoplasm of breast: Secondary | ICD-10-CM

## 2011-06-27 MED ORDER — PEGFILGRASTIM INJECTION 6 MG/0.6ML
6.0000 mg | Freq: Once | SUBCUTANEOUS | Status: AC
  Administered 2011-06-27: 6 mg via SUBCUTANEOUS
  Filled 2011-06-27: qty 0.6

## 2011-06-27 NOTE — Progress Notes (Signed)
Abigail [name] here for Neulasta injection following her 1st Taxol treatment yesterday.  States she is having some nausea, but the antiemetic is helping.  She has no appetite, but is not vomiting or having diarrhea.  Encouraged to continue drinking her fluids and eat small frequent meals all thru the day.  She knows to call us with any questions, problems, or concerns.

## 2011-06-27 NOTE — Telephone Encounter (Signed)
Lunette here for Neulasta injection following 1st Taxol treatment.  Some nausea with good relief with antiemetics.  Drinking well but has no appetite.  Encouraged to continue eating frequent small meals .   Knows to call us with any problems, questions or concerns.

## 2011-06-28 ENCOUNTER — Other Ambulatory Visit: Payer: Self-pay | Admitting: Family Medicine

## 2011-07-03 ENCOUNTER — Other Ambulatory Visit: Payer: Self-pay | Admitting: *Deleted

## 2011-07-03 ENCOUNTER — Other Ambulatory Visit (HOSPITAL_BASED_OUTPATIENT_CLINIC_OR_DEPARTMENT_OTHER): Payer: Medicare Other | Admitting: Lab

## 2011-07-03 DIAGNOSIS — C5700 Malignant neoplasm of unspecified fallopian tube: Secondary | ICD-10-CM

## 2011-07-03 LAB — CBC & DIFF AND RETIC
Basophils Absolute: 0 10*3/uL (ref 0.0–0.1)
Eosinophils Absolute: 0 10*3/uL (ref 0.0–0.5)
HGB: 8.9 g/dL — ABNORMAL LOW (ref 11.6–15.9)
Immature Retic Fract: 3.8 % (ref 1.60–10.00)
NEUT#: 0.1 10*3/uL — CL (ref 1.5–6.5)
RDW: 15.3 % — ABNORMAL HIGH (ref 11.2–14.5)
Retic Ct Abs: 8.45 10*3/uL — ABNORMAL LOW (ref 33.70–90.70)
lymph#: 0.8 10*3/uL — ABNORMAL LOW (ref 0.9–3.3)

## 2011-07-03 MED ORDER — LEVOFLOXACIN 500 MG PO TABS: 500.0000 mg | ORAL_TABLET | Freq: Every day | ORAL | Status: AC

## 2011-07-05 ENCOUNTER — Encounter: Payer: Self-pay | Admitting: Internal Medicine

## 2011-07-05 LAB — HM MAMMOGRAPHY: HM Mammogram: ABNORMAL

## 2011-07-08 ENCOUNTER — Encounter (HOSPITAL_COMMUNITY): Payer: Self-pay | Admitting: *Deleted

## 2011-07-08 ENCOUNTER — Emergency Department (HOSPITAL_COMMUNITY): Payer: Medicare Other

## 2011-07-08 ENCOUNTER — Inpatient Hospital Stay (HOSPITAL_COMMUNITY)
Admission: EM | Admit: 2011-07-08 | Discharge: 2011-07-13 | DRG: 871 | Disposition: A | Discharge status: Home / Self Care | Payer: Medicare Other | Attending: Internal Medicine | Admitting: Internal Medicine

## 2011-07-08 DIAGNOSIS — D630 Anemia in neoplastic disease: Secondary | ICD-10-CM | POA: Diagnosis present

## 2011-07-08 DIAGNOSIS — E871 Hypo-osmolality and hyponatremia: Secondary | ICD-10-CM | POA: Diagnosis present

## 2011-07-08 DIAGNOSIS — R509 Fever, unspecified: Secondary | ICD-10-CM

## 2011-07-08 DIAGNOSIS — D696 Thrombocytopenia, unspecified: Secondary | ICD-10-CM

## 2011-07-08 DIAGNOSIS — E669 Obesity, unspecified: Secondary | ICD-10-CM | POA: Diagnosis present

## 2011-07-08 DIAGNOSIS — T451X5A Adverse effect of antineoplastic and immunosuppressive drugs, initial encounter: Secondary | ICD-10-CM | POA: Diagnosis present

## 2011-07-08 DIAGNOSIS — A419 Sepsis, unspecified organism: Principal | ICD-10-CM | POA: Diagnosis present

## 2011-07-08 DIAGNOSIS — J189 Pneumonia, unspecified organism: Secondary | ICD-10-CM | POA: Diagnosis present

## 2011-07-08 DIAGNOSIS — E876 Hypokalemia: Secondary | ICD-10-CM | POA: Diagnosis present

## 2011-07-08 DIAGNOSIS — D709 Neutropenia, unspecified: Secondary | ICD-10-CM

## 2011-07-08 DIAGNOSIS — D6481 Anemia due to antineoplastic chemotherapy: Secondary | ICD-10-CM | POA: Diagnosis present

## 2011-07-08 DIAGNOSIS — Z6841 Body Mass Index (BMI) 40.0 and over, adult: Secondary | ICD-10-CM

## 2011-07-08 DIAGNOSIS — D72829 Elevated white blood cell count, unspecified: Secondary | ICD-10-CM

## 2011-07-08 DIAGNOSIS — Z853 Personal history of malignant neoplasm of breast: Secondary | ICD-10-CM

## 2011-07-08 DIAGNOSIS — I10 Essential (primary) hypertension: Secondary | ICD-10-CM | POA: Diagnosis present

## 2011-07-08 DIAGNOSIS — D6959 Other secondary thrombocytopenia: Secondary | ICD-10-CM | POA: Diagnosis present

## 2011-07-08 DIAGNOSIS — C5700 Malignant neoplasm of unspecified fallopian tube: Secondary | ICD-10-CM | POA: Diagnosis present

## 2011-07-08 DIAGNOSIS — Z1501 Genetic susceptibility to malignant neoplasm of breast: Secondary | ICD-10-CM

## 2011-07-08 DIAGNOSIS — D649 Anemia, unspecified: Secondary | ICD-10-CM

## 2011-07-08 LAB — COMPREHENSIVE METABOLIC PANEL
CO2: 22 mEq/L (ref 19–32)
Calcium: 8.5 mg/dL (ref 8.4–10.5)
Creatinine, Ser: 0.92 mg/dL (ref 0.50–1.10)
GFR calc Af Amer: 78 mL/min — ABNORMAL LOW (ref >90)
GFR calc non Af Amer: 67 mL/min — ABNORMAL LOW (ref >90)
Glucose, Bld: 107 mg/dL — ABNORMAL HIGH (ref 70–99)
Total Bilirubin: 0.3 mg/dL (ref 0.3–1.2)

## 2011-07-08 LAB — DIFFERENTIAL
Basophils Relative: 0 % (ref 0–1)
Eosinophils Absolute: 0 10*3/uL (ref 0.0–0.7)
Lymphs Abs: 1.6 10*3/uL (ref 0.7–4.0)
Monocytes Absolute: 1.6 10*3/uL — ABNORMAL HIGH (ref 0.1–1.0)
Neutro Abs: 11.4 10*3/uL — ABNORMAL HIGH (ref 1.7–7.7)

## 2011-07-08 LAB — CBC
HCT: 22.4 % — ABNORMAL LOW (ref 36.0–46.0)
Hemoglobin: 7.7 g/dL — ABNORMAL LOW (ref 12.0–15.0)
MCH: 35.2 pg — ABNORMAL HIGH (ref 26.0–34.0)
MCHC: 34.4 g/dL (ref 30.0–36.0)
MCV: 102.3 fL — ABNORMAL HIGH (ref 78.0–100.0)

## 2011-07-08 LAB — OCCULT BLOOD, POC DEVICE: Fecal Occult Bld: NEGATIVE

## 2011-07-08 MED ORDER — SODIUM CHLORIDE 0.9 % IV SOLN
INTRAVENOUS | Status: AC
  Administered 2011-07-09: via INTRAVENOUS

## 2011-07-08 NOTE — ED Provider Notes (Signed)
History     CSN: [id number]  Arrival date & time 07/08/11  2123   First MD Initiated Contact with Patient 07/08/11 2202      Chief Complaint  Patient presents with  . Shortness of Breath    (Consider location/radiation/quality/duration/timing/severity/associated sxs/prior treatment) HPI Comments: Patient has a history of breast cancer 3 years ago, now diagnosed with same in her stomach.  Today she started with shortness of breath after church.  This evening had a fever to 100.5.  She feels shortness of breath but denies cough.  No urinary complaints.  No n/v/d.  Patient is a 56 y.o. female presenting with shortness of breath. The history is provided by the patient.  Shortness of Breath  The current episode started today. The problem occurs continuously. The problem has been gradually worsening. The problem is moderate. The symptoms are relieved by nothing. The symptoms are aggravated by nothing. Associated symptoms include a fever and shortness of breath. Pertinent negatives include no chest pain and no cough.    Past Medical History  Diagnosis Date  . Hypertension   . Obesity   . Diverticulitis   . Hemorrhoids   . Colonic polyp   . GERD (gastroesophageal reflux disease)   . Cancer     BREAST, FALLOPIAN TUBE,    Past Surgical History  Procedure Date  . Tonsillectomy and adenoidectomy   . Breast lumpectomy   . Abdominal hysterectomy     TAH, BSO  . Port a cath     05/29/11    Family History  Problem Relation Age of Onset  . Cancer Mother   . Hypertension Mother   . Diabetes Father   . Heart disease Father   . Hypertension Father   . Stroke Father   . Hypertension Sister   . Hypertension Brother   . Hypertension Paternal Aunt   . Diabetes Paternal Aunt   . Hypertension Paternal Uncle   . Diabetes Paternal Uncle   . Hypertension Paternal Grandmother   . Hypertension Paternal Grandfather     History  Substance Use Topics  . Smoking status: Former Smoker   Types: Cigarettes    Quit date: 08/07/2006  . Smokeless tobacco: Never Used  . Alcohol Use: 0.0 oz/week     occas    OB History    Grav Para Term Preterm Abortions TAB SAB Ect Mult Living                  Review of Systems  Constitutional: Positive for fever.  Respiratory: Positive for shortness of breath. Negative for cough.   Cardiovascular: Negative for chest pain.  All other systems reviewed and are negative.    Allergies  Codeine; Oxycodone; and Vicodin  Home Medications   Current Outpatient Rx  Name Route Sig Dispense Refill  . AMLODIPINE BESYLATE 10 MG PO TABS Oral Take 10 mg by mouth daily with breakfast.    . VITAMIN D3 3000 UNITS PO TABS Oral Take 3,000 Units by mouth daily.     Marland Kitchen LEVOFLOXACIN 500 MG PO TABS Oral Take 1 tablet (500 mg total) by mouth daily. 7 tablet 0  . LIDOCAINE-PRILOCAINE 2.5-2.5 % EX CREA Topical Apply topically as needed. For PAC 30 g 1  . LISINOPRIL-HYDROCHLOROTHIAZIDE 10-12.5 MG PO TABS Oral Take 1 tablet by mouth daily with breakfast.    . LORAZEPAM 0.5 MG PO TABS Oral Take 1 tablet (0.5 mg total) by mouth every 6 (six) hours as needed (Nausea or vomiting). 30 tablet  0  . ONDANSETRON HCL 8 MG PO TABS  Take 1 tab two times a day starting the day after chemo for 3 days. Then take 1 tab two times a day as needed for nausea or vomiting. 30 tablet 1  . PROCHLORPERAZINE MALEATE 10 MG PO TABS Oral Take 1 tablet (10 mg total) by mouth every 6 (six) hours as needed (Nausea or vomiting). 30 tablet 1    BP 110/68  Pulse 136  Temp(Src) 99.5 F (37.5 C) (Oral)  Resp 21  Ht 5\' 1"  (1.549 m)  Wt 214 lb (97.07 kg)  BMI 40.44 kg/m2  SpO2 98%  Physical Exam  Nursing note and vitals reviewed. Constitutional: She is oriented to person, place, and time. She appears well-developed and well-nourished. No distress.  HENT:  Head: Normocephalic and atraumatic.  Neck: Normal range of motion.  Cardiovascular: Regular rhythm.   No murmur heard.       Tachycardic  Pulmonary/Chest: Effort normal. No respiratory distress. She has no wheezes.  Abdominal: Soft. Bowel sounds are normal. She exhibits no distension. There is no tenderness.  Musculoskeletal: Normal range of motion.  Neurological: She is alert and oriented to person, place, and time.  Skin: Skin is warm and dry. She is not diaphoretic.    ED Course  Procedures (including critical care time)   Labs Reviewed  CBC  DIFFERENTIAL  COMPREHENSIVE METABOLIC PANEL  LACTIC ACID, PLASMA  CULTURE, BLOOD (ROUTINE X 2)  CULTURE, BLOOD (ROUTINE X 2)  URINALYSIS, ROUTINE W REFLEX MICROSCOPIC  URINE CULTURE   No results found.   No diagnosis found.   Date: 07/08/2011  Rate: 130  Rhythm: sinus tachycardia  QRS Axis: left  Intervals: normal  ST/T Wave abnormalities: normal  Conduction Disutrbances:none  Narrative Interpretation:   Old EKG Reviewed: unchanged    MDM  The patient presents with shortness of breath and fever at home.  She has a history of breast cancer with spread to the abdomen.  She reports fever at home and feels short of breath.  The wbc does not reveal a neutropenia, but cultures of blood have been obtained.  The urine is pending.  The workup tonight reveals a persistent sinus tachycardia, the cause of which I am uncertain.  The Hb is 7.7 and wbc is 14.6 (she is receiving neulasta).   She will require admission for blood transfusion and rule out of sepsis.  I have spoken with internal medicine who agrees to admit the patient.        Geoffery Lyons, MD 07/08/11 [phone]

## 2011-07-08 NOTE — ED Notes (Signed)
MD at bedside. 

## 2011-07-08 NOTE — ED Notes (Addendum)
Pt c/o shortness of breath starting after church today. Pt has hx of metastatic CA to abdomen. Pt states abdomen began hurting last night. Pt denies CP, n/v at this time. Restricted L arm. Pt has R PAC not accessed at time of arrival.

## 2011-07-08 NOTE — ED Notes (Signed)
RN to obtain labs through port.  

## 2011-07-09 ENCOUNTER — Encounter (HOSPITAL_COMMUNITY): Payer: Self-pay | Admitting: *Deleted

## 2011-07-09 ENCOUNTER — Inpatient Hospital Stay: Admission: RE | Admit: 2011-07-09 | Payer: Medicare Other | Source: Ambulatory Visit

## 2011-07-09 DIAGNOSIS — M311 Thrombotic microangiopathy: Secondary | ICD-10-CM

## 2011-07-09 DIAGNOSIS — R112 Nausea with vomiting, unspecified: Secondary | ICD-10-CM

## 2011-07-09 DIAGNOSIS — D696 Thrombocytopenia, unspecified: Secondary | ICD-10-CM | POA: Diagnosis present

## 2011-07-09 DIAGNOSIS — E871 Hypo-osmolality and hyponatremia: Secondary | ICD-10-CM

## 2011-07-09 DIAGNOSIS — D649 Anemia, unspecified: Secondary | ICD-10-CM | POA: Diagnosis present

## 2011-07-09 DIAGNOSIS — A413 Sepsis due to Hemophilus influenzae: Secondary | ICD-10-CM

## 2011-07-09 DIAGNOSIS — A419 Sepsis, unspecified organism: Secondary | ICD-10-CM | POA: Diagnosis present

## 2011-07-09 LAB — COMPREHENSIVE METABOLIC PANEL
Alkaline Phosphatase: 115 U/L (ref 39–117)
BUN: 10 mg/dL (ref 6–23)
Creatinine, Ser: 0.92 mg/dL (ref 0.50–1.10)
GFR calc Af Amer: 78 mL/min — ABNORMAL LOW (ref >90)
Glucose, Bld: 111 mg/dL — ABNORMAL HIGH (ref 70–99)
Potassium: 3.2 mEq/L — ABNORMAL LOW (ref 3.5–5.1)
Total Bilirubin: 0.3 mg/dL (ref 0.3–1.2)
Total Protein: 6.5 g/dL (ref 6.0–8.3)

## 2011-07-09 LAB — CBC
HCT: 20.6 % — ABNORMAL LOW (ref 36.0–46.0)
Hemoglobin: 7.2 g/dL — ABNORMAL LOW (ref 12.0–15.0)
MCHC: 35 g/dL (ref 30.0–36.0)
MCV: 102.5 fL — ABNORMAL HIGH (ref 78.0–100.0)

## 2011-07-09 LAB — URINALYSIS, ROUTINE W REFLEX MICROSCOPIC
Bilirubin Urine: NEGATIVE
Nitrite: NEGATIVE
Protein, ur: NEGATIVE mg/dL
Specific Gravity, Urine: 1.017 (ref 1.005–1.030)
Urobilinogen, UA: 0.2 mg/dL (ref 0.0–1.0)

## 2011-07-09 LAB — PREPARE RBC (CROSSMATCH)

## 2011-07-09 MED ORDER — HYDROCHLOROTHIAZIDE 12.5 MG PO CAPS
12.5000 mg | ORAL_CAPSULE | Freq: Every day | ORAL | Status: DC
  Filled 2011-07-09: qty 1

## 2011-07-09 MED ORDER — POTASSIUM CHLORIDE IN NACL 20-0.9 MEQ/L-% IV SOLN
INTRAVENOUS | Status: AC
  Administered 2011-07-09: 04:00:00 via INTRAVENOUS
  Filled 2011-07-09 (×3): qty 1000

## 2011-07-09 MED ORDER — ACETAMINOPHEN 325 MG PO TABS: 650.0000 mg | ORAL_TABLET | Freq: Four times a day (QID) | ORAL | Status: DC | PRN

## 2011-07-09 MED ORDER — SODIUM CHLORIDE 0.9 % IJ SOLN
10.0000 mL | INTRAMUSCULAR | Status: DC | PRN
  Administered 2011-07-11 – 2011-07-13 (×4): 10 mL

## 2011-07-09 MED ORDER — VANCOMYCIN HCL IN DEXTROSE 1-5 GM/200ML-% IV SOLN
1000.0000 mg | Freq: Two times a day (BID) | INTRAVENOUS | Status: DC
  Administered 2011-07-09 – 2011-07-10 (×2): 1000 mg via INTRAVENOUS
  Filled 2011-07-09 (×3): qty 200

## 2011-07-09 MED ORDER — VANCOMYCIN HCL IN DEXTROSE 1-5 GM/200ML-% IV SOLN
1000.0000 mg | Freq: Once | INTRAVENOUS | Status: AC
  Administered 2011-07-09: 1000 mg via INTRAVENOUS
  Filled 2011-07-09: qty 200

## 2011-07-09 MED ORDER — VITAMIN D 1000 UNITS PO TABS
3000.0000 [IU] | ORAL_TABLET | Freq: Every day | ORAL | Status: DC
  Administered 2011-07-09 – 2011-07-13 (×5): 3000 [IU] via ORAL
  Filled 2011-07-09 (×5): qty 3

## 2011-07-09 MED ORDER — ONDANSETRON HCL 4 MG PO TABS: 4.0000 mg | ORAL_TABLET | Freq: Four times a day (QID) | ORAL | Status: DC | PRN

## 2011-07-09 MED ORDER — DOCUSATE SODIUM 100 MG PO CAPS
100.0000 mg | ORAL_CAPSULE | Freq: Two times a day (BID) | ORAL | Status: DC
  Administered 2011-07-09 – 2011-07-13 (×10): 100 mg via ORAL
  Filled 2011-07-09 (×11): qty 1

## 2011-07-09 MED ORDER — POTASSIUM CHLORIDE CRYS ER 20 MEQ PO TBCR
40.0000 meq | EXTENDED_RELEASE_TABLET | Freq: Once | ORAL | Status: AC
  Administered 2011-07-09: 40 meq via ORAL
  Filled 2011-07-09 (×2): qty 2

## 2011-07-09 MED ORDER — BISACODYL 5 MG PO TBEC
5.0000 mg | DELAYED_RELEASE_TABLET | Freq: Every day | ORAL | Status: DC | PRN
  Filled 2011-07-09: qty 1

## 2011-07-09 MED ORDER — PIPERACILLIN-TAZOBACTAM 3.375 G IVPB 30 MIN
3.3750 g | Freq: Once | INTRAVENOUS | Status: AC
  Administered 2011-07-09: 3.375 g via INTRAVENOUS
  Filled 2011-07-09: qty 50

## 2011-07-09 MED ORDER — PIPERACILLIN-TAZOBACTAM 3.375 G IVPB
3.3750 g | Freq: Three times a day (TID) | INTRAVENOUS | Status: DC
  Filled 2011-07-09: qty 50

## 2011-07-09 MED ORDER — PIPERACILLIN-TAZOBACTAM 3.375 G IVPB
3.3750 g | Freq: Three times a day (TID) | INTRAVENOUS | Status: DC
  Administered 2011-07-09 – 2011-07-13 (×13): 3.375 g via INTRAVENOUS
  Filled 2011-07-09 (×14): qty 50

## 2011-07-09 MED ORDER — ACETAMINOPHEN 650 MG RE SUPP: 650.0000 mg | Freq: Four times a day (QID) | RECTAL | Status: DC | PRN

## 2011-07-09 MED ORDER — LISINOPRIL-HYDROCHLOROTHIAZIDE 10-12.5 MG PO TABS: 1.0000 | ORAL_TABLET | Freq: Every day | ORAL | Status: DC

## 2011-07-09 MED ORDER — LISINOPRIL 10 MG PO TABS
10.0000 mg | ORAL_TABLET | Freq: Every day | ORAL | Status: DC
  Administered 2011-07-09 – 2011-07-13 (×5): 10 mg via ORAL
  Filled 2011-07-09 (×5): qty 1

## 2011-07-09 MED ORDER — PROCHLORPERAZINE MALEATE 10 MG PO TABS
10.0000 mg | ORAL_TABLET | Freq: Four times a day (QID) | ORAL | Status: DC | PRN
  Filled 2011-07-09: qty 1

## 2011-07-09 MED ORDER — POTASSIUM CHLORIDE CRYS ER 20 MEQ PO TBCR
40.0000 meq | EXTENDED_RELEASE_TABLET | ORAL | Status: AC
  Administered 2011-07-09 (×2): 40 meq via ORAL
  Filled 2011-07-09 (×2): qty 2

## 2011-07-09 MED ORDER — AMLODIPINE BESYLATE 10 MG PO TABS
10.0000 mg | ORAL_TABLET | Freq: Every day | ORAL | Status: DC
  Filled 2011-07-09: qty 1

## 2011-07-09 MED ORDER — LORAZEPAM 0.5 MG PO TABS: 0.5000 mg | ORAL_TABLET | Freq: Four times a day (QID) | ORAL | Status: DC | PRN

## 2011-07-09 MED ORDER — MAGNESIUM SULFATE 40 MG/ML IJ SOLN
2.0000 g | Freq: Once | INTRAMUSCULAR | Status: AC
  Administered 2011-07-09: 2 g via INTRAVENOUS
  Filled 2011-07-09: qty 50

## 2011-07-09 MED ORDER — PIPERACILLIN-TAZOBACTAM 3.375 G IVPB
3.3750 g | Freq: Three times a day (TID) | INTRAVENOUS | Status: DC
  Filled 2011-07-09 (×2): qty 50

## 2011-07-09 MED ORDER — ALUM & MAG HYDROXIDE-SIMETH 200-200-20 MG/5ML PO SUSP: 30.0000 mL | Freq: Four times a day (QID) | ORAL | Status: DC | PRN

## 2011-07-09 MED ORDER — ONDANSETRON HCL 4 MG/2ML IJ SOLN: 4.0000 mg | Freq: Four times a day (QID) | INTRAMUSCULAR | Status: DC | PRN

## 2011-07-09 NOTE — Care Management Note (Signed)
    Page 1 of 1   07/13/2011     1:52:17 PM   CARE MANAGEMENT NOTE 07/13/2011  Patient:  Abigail [name], Abigail [name]   Account Number:  [id number]  Date Initiated:  07/09/2011  Documentation initiated by:  Lanier Clam  Subjective/Objective Assessment:   ADMITTED W/ANEMIA     Action/Plan:   FROM HOME   Anticipated DC Date:  07/13/2011   Anticipated DC Plan:  HOME W HOME HEALTH SERVICES      DC Planning Services  CM consult      Choice offered to / List presented to:  C-1 Patient        HH arranged  HH-2 PT  HH-3 OT      [facility] agency  [facility]   Status of service:  Completed, signed off Medicare Important Message given?   (If response is "NO", the following Medicare IM given date fields will be blank) Date Medicare IM given:   Date Additional Medicare IM given:    Discharge Disposition:  HOME W HOME HEALTH SERVICES  Per UR Regulation:  Reviewed for med. necessity/level of care/duration of stay  If discussed at Long Length of Stay Meetings, dates discussed:    Comments:  07/13/11 Amario Longmore RN,BSN NCM [phone] [facility] CONTACTED FOR HHPT/OT,AWARE OF D/C TODAY,& INFO NEEDED RECEIVED..  07/09/11 Carlisha Wisler RN,BSN NCM [phone]

## 2011-07-09 NOTE — Progress Notes (Signed)
ANTIBIOTIC CONSULT NOTE - INITIAL  Pharmacy Consult for vancomycin Indication: rule out sepsis  Allergies  Allergen Reactions  . Codeine Itching  . Oxycodone Nausea Only  . Vicodin (Hydrocodone-Acetaminophen) Nausea Only    Patient Measurements: Height: 5\' 1"  (154.9 cm) Weight: 213 lb 6.5 oz (96.8 kg) (standup scale) IBW/kg (Calculated) : 47.8  Adjusted Body Weight:   Vital Signs: Temp: 98.9 F (37.2 C) (06/03 0415) Temp src: Oral (06/03 0415) BP: 117/76 mmHg (06/03 0415) Pulse Rate: 115  (06/03 0415) Intake/Output from previous day: 06/02 0701 - 06/03 0700 In: 12.5 [Blood:12.5] Out: 300 [Urine:300] Intake/Output from this shift: Total I/O In: 12.5 [Blood:12.5] Out: 300 [Urine:300]  Labs:  [facility] 07/09/11 0155 07/08/11 2146  WBC 13.3* 14.6*  HGB 7.2* 7.7*  PLT 73* 81*  LABCREA -- --  CREATININE 0.92 0.92   Estimated Creatinine Clearance: 70.9 ml/min (by C-G formula based on Cr of 0.92). No results found for this basename: VANCOTROUGH:2,VANCOPEAK:2,VANCORANDOM:2,GENTTROUGH:2,GENTPEAK:2,GENTRANDOM:2,TOBRATROUGH:2,TOBRAPEAK:2,TOBRARND:2,AMIKACINPEAK:2,AMIKACINTROU:2,AMIKACIN:2, in the last 72 hours   Microbiology: Recent Results (from the past 720 hour(s))  CULTURE, BLOOD (SINGLE)     Status: Normal   Collection Time   06/12/11 10:27 AM      Component Value Range Status Comment   Blood Culture, Routine Culture, Blood   Final Final - ===== FINAL REPORT =====NO GROWTH 5 DAYS  URINE CULTURE     Status: Normal   Collection Time   06/12/11  3:04 PM      Component Value Range Status Comment   Specimen Description URINE, CLEAN CATCH   Final    Special Requests none Immunocompromised   Final    Culture  Setup Time [id number]   Final    Colony Count NO GROWTH   Final    Culture NO GROWTH   Final    Report Status 06/13/2011 FINAL   Final     Medical History: Past Medical History  Diagnosis Date  . Hypertension   . Obesity   . Diverticulitis   . Hemorrhoids     . Colonic polyp   . GERD (gastroesophageal reflux disease)   . Cancer     BREAST, FALLOPIAN TUBE,    Medications:  Anti-infectives     Start     Dose/Rate Route Frequency Ordered Stop   07/09/11 1000   vancomycin (VANCOCIN) IVPB 1000 mg/200 mL premix        1,000 mg 200 mL/hr over 60 Minutes Intravenous Every 12 hours 07/09/11 0447     07/09/11 0800  piperacillin-tazobactam (ZOSYN) IVPB 3.375 g       3.375 g 12.5 mL/hr over 240 Minutes Intravenous Every 8 hours 07/09/11 0447     07/09/11 0600   piperacillin-tazobactam (ZOSYN) IVPB 3.375 g  Status:  Discontinued        3.375 g 12.5 mL/hr over 240 Minutes Intravenous 3 times per day 07/09/11 0047 07/09/11 0447   07/09/11 0100  piperacillin-tazobactam (ZOSYN) IVPB 3.375 g       3.375 g 100 mL/hr over 30 Minutes Intravenous  Once 07/09/11 0044 07/09/11 0335   07/09/11 0045   vancomycin (VANCOCIN) IVPB 1000 mg/200 mL premix        1,000 mg 200 mL/hr over 60 Minutes Intravenous  Once 07/09/11 0044 07/09/11 0330         Assessment: Patient with sepsis.    Goal of Therapy:  Vancomycin trough level 15-20 mcg/ml Zosyn based on renal function   Plan:  Measure antibiotic drug levels at steady state Follow up culture  results vancomycin 1gm iv q12hr Zosyn 3.375g IV Q8H infused over 4hrs.   Darlina Guys, Jacquenette Shone Crowford 07/09/2011,4:47 AM

## 2011-07-09 NOTE — Progress Notes (Signed)
Pt was seen and examined at bedside. I have reviewed labs and vitals which are stable and pt is hemodynamically stable. I agree with the above's assessment and plan.  Will continue broad spectrum antibiotics for now but if persistent fever noted will have to consider removing porth-a-cath. Will follow up on blood culture, supplement electrolytes as indicated, will check Mg level, BMP and CBC in AM.  MAGICK-Nehemias Sauceda [facility], pager #: [phone] Main office number: [phone]

## 2011-07-09 NOTE — H&P (Signed)
[facility]                                                                                     Patient Demographics  Abigail [name], is a 56 y.o. female  CSN: [id number]  MRN: [id number]  DOB - 1955/07/25  Admit Date - 07/08/2011  Outpatient Primary MD for the patient is Abigail Herter, MD, MD   With History of -  Past Medical History  Diagnosis Date  . Hypertension   . Obesity   . Diverticulitis   . Hemorrhoids   . Colonic polyp   . GERD (gastroesophageal reflux disease)   . Cancer     BREAST, FALLOPIAN TUBE,    recurrent fallopian tube cancer Past Surgical History  Procedure Date  . Tonsillectomy and adenoidectomy   . Breast lumpectomy   . Abdominal hysterectomy     TAH, BSO  . Port a cath     05/29/11    in for   Chief Complaint  Patient presents with  . Shortness of Breath   fever  HPI  Abigail [name]  is a 56 y.o. 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, patient reports she received her last chemotherapy 2 weeks ago which was followed by Neulasta, patient reports she has been feeling febrile for one day, been feeling progressive weakness, fatigue and shortness of breath, patient had temperature 100.5 at home, and was found to have leukocytosis of 14,000 in CBC, chest x-ray did not show any acute infiltrate, cultures were sent, urine analysis is patient was found to have hemoglobin of 7.7, total was found to have thrombocytopenia of 84,000, and sodium of 129, hospitalist service we are requested to admit for management of sepsis, patient was recently discharged from [facility] for neutropenic fever, patient reports shortness of breath denies any leg swelling recent travel chest pain hemoptysis, has complaints of mild cough nonproductive denies any dysuria or polyuria worsening abdominal pain.    Review of Systems    In addition  to the HPI above, Complains of Fever- No Headache, No changes with Vision or hearing, No problems swallowing food or Liquids, No Chest pain, mild Cough and Shortness of Breath, No Abdominal pain, No Nausea or Vommitting, Bowel movements are regular, No Blood in stool or Urine, No dysuria, No new skin rashes or bruises, No new joints pains-aches,  Complains of generalized weakness, has tingling tingling and numbness in her extremity do to chemotherapy No recent weight gain or loss, No polyuria, polydypsia or polyphagia, No significant Mental Stressors.  A full 10 point Review of Systems was done, except as stated above, all other Review of Systems were negative.   Social History History  Substance Use Topics  . Smoking status: Former Smoker    Types: Cigarettes    Quit date: 08/07/2006  . Smokeless tobacco: Never Used  . Alcohol Use: 0.0 oz/week     occas     Family History Family History  Problem Relation Age of Onset  . Cancer Mother   . Hypertension Mother   . Diabetes Father   . Heart disease Father   .  Hypertension Father   . Stroke Father   . Hypertension Sister   . Hypertension Brother   . Hypertension Paternal Aunt   . Diabetes Paternal Aunt   . Hypertension Paternal Uncle   . Diabetes Paternal Uncle   . Hypertension Paternal Grandmother   . Hypertension Paternal Grandfather     Prior to Admission medications   Medication Sig Start Date End Date Taking? Authorizing Provider  amLODipine (NORVASC) 10 MG tablet Take 10 mg by mouth daily with breakfast.   Yes Historical Provider, MD  Cholecalciferol (VITAMIN D3) 3000 UNITS TABS Take 3,000 Units by mouth daily.    Yes Historical Provider, MD  levofloxacin (LEVAQUIN) 500 MG tablet Take 1 tablet (500 mg total) by mouth daily. 07/03/11 07/13/11 Yes Pierce Crane, MD  lidocaine-prilocaine (EMLA) cream Apply topically as needed. For [facility] 06/01/11 05/31/12 Yes Pierce Crane, MD  lisinopril-hydrochlorothiazide  (PRINZIDE,ZESTORETIC) 10-12.5 MG per tablet Take 1 tablet by mouth daily with breakfast.   Yes Historical Provider, MD  LORazepam (ATIVAN) 0.5 MG tablet Take 1 tablet (0.5 mg total) by mouth every 6 (six) hours as needed (Nausea or vomiting). 05/24/11 11/20/11 Yes Pierce Crane, MD  ondansetron (ZOFRAN) 8 MG tablet Take 1 tab two times a day starting the day after chemo for 3 days. Then take 1 tab two times a day as needed for nausea or vomiting. 05/24/11 05/23/12 Yes Pierce Crane, MD  prochlorperazine (COMPAZINE) 10 MG tablet Take 1 tablet (10 mg total) by mouth every 6 (six) hours as needed (Nausea or vomiting). 05/24/11 05/23/12 Yes Pierce Crane, MD    Allergies  Allergen Reactions  . Codeine Itching  . Oxycodone Nausea Only  . Vicodin (Hydrocodone-Acetaminophen) Nausea Only    Physical Exam  Vitals  Blood pressure 101/71, pulse 123, temperature 99.5 F (37.5 C), temperature source Oral, resp. rate 19, height 5\' 1"  (1.549 m), weight 97.07 kg (214 lb), SpO2 97.00%.   1. General well-nourished female lying in bed in NAD,    2. Normal affect and insight, Not Suicidal or Homicidal, Awake Alert, Oriented *3.  3. No F.N deficits, ALL C.Nerves Intact, Strength 5/5 all 4 extremities, Sensation intact all 4 extremities, Plantars down going.  4. Ears and Eyes appear Normal, Conjunctivae clear, PERRLA. Moist Oral Mucosa.  5. Supple Neck, No JVD, No cervical lymphadenopathy appriciated, No Carotid Bruits.  6. Symmetrical Chest wall movement, Good air movement bilaterally, CTAB.  7. RRR, No Gallops, Rubs or Murmurs, No Parasternal Heave.  8. Positive Bowel Sounds, Abdomen, No organomegaly appriciated, obese      No rebound -guarding or rigidity.  9.  No Cyanosis, Normal Skin Turgor, No Skin Rash or Bruise.  10. Good muscle tone,  joints appear normal , no effusions, Normal ROM.      Data Review  CBC  Lab 07/08/11 2146 07/03/11 0915  WBC 14.6* 1.6*  HGB 7.7* 8.9*  HCT 22.4* 26.2*    PLT 81* 265  MCV 102.3* 102.3*  MCH 35.2* 34.8*  MCHC 34.4 34.0  RDW 15.8* 15.3*  LYMPHSABS 1.6 0.8*  MONOABS 1.6* 0.7  EOSABS 0.0 0.0  BASOSABS 0.0 0.0  BANDABS -- --   ------------------------------------------------------------------------------------------------------------------  Chemistries   Lab 07/08/11 2146  NA 129*  K 3.3*  CL 95*  CO2 22  GLUCOSE 107*  BUN 11  CREATININE 0.92  CALCIUM 8.5  MG --  AST 14  ALT 13  ALKPHOS 115  BILITOT 0.3   ------------------------------------------------------------------------------------------------------------------ estimated creatinine clearance is 71 ml/min (by C-G  formula based on Cr of 0.92). ------------------------------------------------------------------------------------------------------------------ No results found for this basename: TSH,T4TOTAL,FREET3,T3FREE,THYROIDAB in the last 72 hours   Coagulation profile No results found for this basename: INR:5,PROTIME:5 in the last 168 hours ------------------------------------------------------------------------------------------------------------------- No results found for this basename: DDIMER:2 in the last 72 hours -------------------------------------------------------------------------------------------------------------------  Cardiac Enzymes No results found for this basename: CK:3,CKMB:3,TROPONINI:3,MYOGLOBIN:3 in the last 168 hours ------------------------------------------------------------------------------------------------------------------ No components found with this basename: POCBNP:3   ---------------------------------------------------------------------------------------------------------------  Urinalysis    Component Value Date/Time   COLORURINE YELLOW 10/07/2007 0056   APPEARANCEUR CLEAR 10/07/2007 0056   LABSPEC 1.006 10/07/2007 0056   PHURINE 7.0 10/07/2007 0056   GLUCOSEU NEGATIVE 10/07/2007 0056   HGBUR NEGATIVE 10/07/2007 0056   BILIRUBINUR  NEGATIVE 10/07/2007 0056   KETONESUR NEGATIVE 10/07/2007 0056   PROTEINUR NEGATIVE 10/07/2007 0056   UROBILINOGEN 0.2 10/07/2007 0056   NITRITE NEGATIVE 10/07/2007 0056   LEUKOCYTESUR NEGATIVE MICROSCOPIC NOT DONE ON URINES WITH NEGATIVE PROTEIN, BLOOD, LEUKOCYTES, NITRITE, OR GLUCOSE <1000 mg/dL. 10/07/2007 0056    ----------------------------------------------------------------------------------------------------------------    Imaging results:   Dg Chest 2 View  07/08/2011  *RADIOLOGY REPORT*  Clinical Data: Shortness of breath.  CHEST - 2 VIEW  Comparison: 06/12/2011.  Findings: The power port is stable.  The heart is enlarged but unchanged.  The low lung volumes with vascular crowding and bibasilar atelectasis.  No infiltrates, edema or effusions.  The bony thorax is intact.  Stable degenerative changes involving the thoracic spine.  IMPRESSION:  1.  Cardiac enlargement, unchanged. 2.  Low lung volumes with vascular crowding and bibasilar atelectasis.  Original Report Authenticated By: P. Loralie Champagne, M.D.   X-ray Chest Pa And Lateral   06/12/2011  *RADIOLOGY REPORT*  Clinical Data: Fever.  Neutropenia.  CHEST - 2 VIEW  Comparison: Chest CT 10/24/2010.  Findings:  The heart is mildly enlarged but stable.  A right-sided power port is in good position.  There is streaky bibasilar atelectasis but no effusions or pneumothorax.  Apical pleural thickening is noted on the left.  IMPRESSION: Stable cardiac enlargement. Streaky bibasilar atelectasis.  Original Report Authenticated By: P. Loralie Champagne, M.D.     Assessment & Plan  Principal Problem:  *Sepsis Active Problems:  Hyponatremia  Anemia  Thrombocytopenia    1. sepsis. Source is unknown, has no infiltrates and chest x-rays blood cultures were sent Port-A-Cath site looks clean urinalysis is pending we will start him on broad-spectrum empiric antibiotic coverage general cultures are back.  2 anemia. This is most likely anemia of chronic  disease and secondary to chemotherapy as patient is symptomatic where she presents with tachycardia and shortness of breath Will transfuse one unit packed red  cell.  3. Hyponatremia. This is due to dehydration we'll continue with IV normal skull and will repeat BMP in a.m.  4. Thrombocytopenia. This is most likely due to chemotherapy we'll hold chemical anticoagulation will monitor  5. Recurrent fallopian tumor. Patient will follow with hematology oncology as an outpatient upon discharge.  6 point hypertension point blood pressure is borderline we'll hold Norvasc.  DVT Prophylaxis  SCDs   AM Labs Ordered, also please review Full Orders  Admission, patients condition and plan of care including tests being ordered have been discussed with the patient who indicate understanding and agree with the plan and Code Status.  Code Status full  Condition GUARDED  Randol Kern, [place] M.D on 07/09/2011 at 12:27 AM [facility] Office  [phone]

## 2011-07-09 NOTE — Progress Notes (Signed)
Subjective: Lying in bed eyes closed easily aroused. Denies pain/discomfort. Reports feeling "alot better than i did last night".  Objective: Vital signs Filed Vitals:   07/09/11 0700 07/09/11 0710 07/09/11 0732 07/09/11 0832  BP: 119/81 118/80 98/64 117/84  Pulse: 109 108 102 107  Temp: 99 F (37.2 C) 98.9 F (37.2 C) 98.5 F (36.9 C) 99.3 F (37.4 C)  TempSrc: Oral Oral Oral Oral  Resp: 20 20 20 20   Height:      Weight:      SpO2:       Weight change:  Last BM Date: 07/08/11  Intake/Output from previous day: 06/02 0701 - 06/03 0700 In: 950 [I.V.:350; Blood:350; IV Piggyback:250] Out: 600 [Urine:600] Total I/O In: 12.5 [Blood:12.5] Out: 250 [Urine:250]   Physical Exam: General: Alert, awake, oriented x3, in no acute distress. HEENT: No bruits, no goiter. Normocephalic, PERRL, mucus membrane mouth moist/pink Heart: Regular rate and rhythm, without murmurs, rubs, gallops. Trace LEE PPP Lungs:Normal effort. Breath sounds clear to auscultation bilaterally. No wheeze Abdomen:  Obese,Soft, nontender, nondistended, positive bowel sounds. Extremities: No clubbing cyanosis  with positive pedal pulses. Trace LEE Neuro: Grossly intact, nonfocal. Speech clear. Facial symmetry    Lab Results: Basic Metabolic Panel:  Basename 07/09/11 0155 07/08/11 2146  NA 132* 129*  K 3.2* 3.3*  CL 97 95*  CO2 21 22  GLUCOSE 111* 107*  BUN 10 11  CREATININE 0.92 0.92  CALCIUM 8.3* 8.5  MG -- --  PHOS -- --   Liver Function Tests:  Basename 07/09/11 0155 07/08/11 2146  AST 14 14  ALT 12 13  ALKPHOS 115 115  BILITOT 0.3 0.3  PROT 6.5 7.1  ALBUMIN 3.0* 3.1*   No results found for this basename: LIPASE:2,AMYLASE:2 in the last 72 hours No results found for this basename: AMMONIA:2 in the last 72 hours CBC:  Basename 07/09/11 0155 07/08/11 2146  WBC 13.3* 14.6*  NEUTROABS -- 11.4*  HGB 7.2* 7.7*  HCT 20.6* 22.4*  MCV 102.5* 102.3*  PLT 73* 81*   Cardiac Enzymes: No  results found for this basename: CKTOTAL:3,CKMB:3,CKMBINDEX:3,TROPONINI:3 in the last 72 hours BNP: No results found for this basename: PROBNP:3 in the last 72 hours D-Dimer: No results found for this basename: DDIMER:2 in the last 72 hours CBG: No results found for this basename: GLUCAP:6 in the last 72 hours Hemoglobin A1C: No results found for this basename: HGBA1C in the last 72 hours Fasting Lipid Panel: No results found for this basename: CHOL,HDL,LDLCALC,TRIG,CHOLHDL,LDLDIRECT in the last 72 hours Thyroid Function Tests: No results found for this basename: TSH,T4TOTAL,FREET4,T3FREE,THYROIDAB in the last 72 hours Anemia Panel: No results found for this basename: VITAMINB12,FOLATE,FERRITIN,TIBC,IRON,RETICCTPCT in the last 72 hours Coagulation: No results found for this basename: LABPROT:2,INR:2 in the last 72 hours Urine Drug Screen: Drugs of Abuse  No results found for this basename: labopia, cocainscrnur, labbenz, amphetmu, thcu, labbarb    Alcohol Level: No results found for this basename: ETH:2 in the last 72 hours Urinalysis:  Basename 07/09/11 0015  COLORURINE YELLOW  LABSPEC 1.017  PHURINE 5.5  GLUCOSEU NEGATIVE  HGBUR NEGATIVE  BILIRUBINUR NEGATIVE  KETONESUR NEGATIVE  PROTEINUR NEGATIVE  UROBILINOGEN 0.2  NITRITE NEGATIVE  LEUKOCYTESUR NEGATIVE   Misc. Labs:  No results found for this or any previous visit (from the past 240 hour(s)).  Studies/Results: Dg Chest 2 View  07/08/2011  *RADIOLOGY REPORT*  Clinical Data: Shortness of breath.  CHEST - 2 VIEW  Comparison: 06/12/2011.  Findings: The power port  is stable.  The heart is enlarged but unchanged.  The low lung volumes with vascular crowding and bibasilar atelectasis.  No infiltrates, edema or effusions.  The bony thorax is intact.  Stable degenerative changes involving the thoracic spine.  IMPRESSION:  1.  Cardiac enlargement, unchanged. 2.  Low lung volumes with vascular crowding and bibasilar atelectasis.   Original Report Authenticated By: P. Loralie Champagne, M.D.    Medications: Scheduled Meds:   . sodium chloride   Intravenous STAT  . cholecalciferol  3,000 Units Oral Daily  . docusate sodium  100 mg Oral BID  . hydrochlorothiazide  12.5 mg Oral Daily  . lisinopril  10 mg Oral Daily  . piperacillin-tazobactam  3.375 g Intravenous Once  . piperacillin-tazobactam (ZOSYN)  IV  3.375 g Intravenous Q8H  . vancomycin  1,000 mg Intravenous Once  . vancomycin  1,000 mg Intravenous Q12H  . DISCONTD: amLODipine  10 mg Oral Q breakfast  . DISCONTD: lisinopril-hydrochlorothiazide  1 tablet Oral Q breakfast  . DISCONTD: piperacillin-tazobactam  3.375 g Intravenous Q8H   Continuous Infusions:   . 0.9 % NaCl with KCl 20 mEq / L 50 mL/hr at 07/09/11 0338   PRN Meds:.acetaminophen, acetaminophen, alum & mag hydroxide-simeth, bisacodyl, LORazepam, ondansetron (ZOFRAN) IV, ondansetron, prochlorperazine  Assessment/Plan:  Principal Problem:  *Sepsis Active Problems:  Hyponatremia  Anemia  Thrombocytopenia 1. sepsis. Source remains unclear, has no infiltrates on chest x-rays blood cultures pending Port-A-Cath site looks clean urinalysis is negative. White count trending downward. Fever 99. Will continue  broad-spectrum empiric antibiotic coverage cultures are back. Vanc/Zosyn day #1.  2 anemia. This is most likely anemia of chronic disease and secondary to chemotherapy. 2nd unit PRBC in process. Tachycardia improved. Will recheck in am.   3. Hyponatremia. Likely related  to dehydration. Improved we'll continue with gentle IV hydration. Recheck in am.   4. Thrombocytopenia. This is most likely due to chemotherapy. Hold chemical anticoagulation will monitor  5. Recurrent fallopian tumor. Will notify Dr. Renelda Loma office of pt admission.  Patient will follow with hematology oncology as an outpatient upon discharge.  6 HTN. SBP range 98-119. Will continue to hold norvasc and HCTZ.  Continue lisinopril.  Monitor closely. 7. Mild hypokalemia. Likely related to #1. Will replete and recheck. Will check mag level as well.  7. Code status: full  DVT Prophylaxis SCDs       LOS: 1 day   [facility] M 07/09/2011, 8:44 AM

## 2011-07-10 ENCOUNTER — Inpatient Hospital Stay (HOSPITAL_COMMUNITY): Payer: Medicare Other

## 2011-07-10 ENCOUNTER — Other Ambulatory Visit (HOSPITAL_COMMUNITY): Payer: Medicare Other

## 2011-07-10 DIAGNOSIS — M311 Thrombotic microangiopathy: Secondary | ICD-10-CM

## 2011-07-10 DIAGNOSIS — R112 Nausea with vomiting, unspecified: Secondary | ICD-10-CM

## 2011-07-10 DIAGNOSIS — E871 Hypo-osmolality and hyponatremia: Secondary | ICD-10-CM

## 2011-07-10 DIAGNOSIS — A413 Sepsis due to Hemophilus influenzae: Secondary | ICD-10-CM

## 2011-07-10 LAB — CBC
HCT: 25.2 % — ABNORMAL LOW (ref 36.0–46.0)
Hemoglobin: 8.7 g/dL — ABNORMAL LOW (ref 12.0–15.0)
MCV: 97.7 fL (ref 78.0–100.0)
RBC: 2.58 MIL/uL — ABNORMAL LOW (ref 3.87–5.11)
WBC: 13.3 10*3/uL — ABNORMAL HIGH (ref 4.0–10.5)

## 2011-07-10 LAB — VANCOMYCIN, TROUGH: Vancomycin Tr: 12.7 ug/mL (ref 10.0–20.0)

## 2011-07-10 LAB — BASIC METABOLIC PANEL
BUN: 8 mg/dL (ref 6–23)
CO2: 22 mEq/L (ref 19–32)
Chloride: 98 mEq/L (ref 96–112)
Creatinine, Ser: 0.83 mg/dL (ref 0.50–1.10)
Glucose, Bld: 103 mg/dL — ABNORMAL HIGH (ref 70–99)

## 2011-07-10 LAB — TYPE AND SCREEN
ABO/RH(D): O POS
Antibody Screen: NEGATIVE
Unit division: 0

## 2011-07-10 LAB — PROCALCITONIN: Procalcitonin: <0.1 ng/mL

## 2011-07-10 LAB — MAGNESIUM: Magnesium: 1 mg/dL — ABNORMAL LOW (ref 1.5–2.5)

## 2011-07-10 MED ORDER — IOHEXOL 300 MG/ML  SOLN
100.0000 mL | Freq: Once | INTRAMUSCULAR | Status: AC | PRN
  Administered 2011-07-10: 80 mL via INTRAVENOUS

## 2011-07-10 MED ORDER — VANCOMYCIN HCL 1000 MG IV SOLR
1250.0000 mg | Freq: Two times a day (BID) | INTRAVENOUS | Status: DC
  Administered 2011-07-10 – 2011-07-13 (×6): 1250 mg via INTRAVENOUS
  Filled 2011-07-10 (×7): qty 1250

## 2011-07-10 MED ORDER — VANCOMYCIN HCL IN DEXTROSE 1-5 GM/200ML-% IV SOLN
1000.0000 mg | Freq: Two times a day (BID) | INTRAVENOUS | Status: DC
  Filled 2011-07-10: qty 200

## 2011-07-10 MED ORDER — MAGNESIUM SULFATE 40 MG/ML IJ SOLN
4.0000 g | Freq: Once | INTRAMUSCULAR | Status: AC
  Administered 2011-07-10: 4 g via INTRAVENOUS
  Filled 2011-07-10: qty 100

## 2011-07-10 NOTE — Progress Notes (Signed)
Pt was seen and examined at bedside. I have reviewed labs and vitals which are stable and pt is hemodynamically stable. I agree with the above's assessment and plan.  Will obtain CT Angio of the chest to rule put PE, pt was tachycardic in AM but now HR is < 100 adn pt reports feeling well and at her baseline.  Debbora Presto [facility], pager #: [phone] Main office number: [phone]

## 2011-07-10 NOTE — Progress Notes (Signed)
ANTIBIOTIC CONSULT NOTE - INITIAL  Pharmacy Consult for vancomycin Indication: rule out sepsis  Allergies  Allergen Reactions  . Codeine Itching  . Oxycodone Nausea Only  . Vicodin (Hydrocodone-Acetaminophen) Nausea Only    Patient Measurements: Height: 5\' 1"  (154.9 cm) Weight: 213 lb 6.5 oz (96.8 kg) (standup scale) IBW/kg (Calculated) : 47.8  Adjusted Body Weight:   Vital Signs: Temp: 98.7 F (37.1 C) (06/04 1434) Temp src: Oral (06/04 1434) BP: 132/78 mmHg (06/04 1434) Pulse Rate: 104  (06/04 1434) Intake/Output from previous day: 06/03 0701 - 06/04 0700 In: 1867.5 [P.O.:480; I.V.:550; Blood:487.5; IV Piggyback:350] Out: 2750 [Urine:2750] Intake/Output from this shift: Total I/O In: 240 [P.O.:240] Out: 1125 [Urine:1125]  Labs:  [facility] 07/10/11 0335 07/09/11 1340 07/09/11 0155 07/08/11 2146  WBC 13.3* -- 13.3* 14.6*  HGB 8.7* 9.5* 7.2* --  PLT 58* -- 73* 81*  LABCREA -- -- -- --  CREATININE 0.83 -- 0.92 0.92   Estimated Creatinine Clearance: 78.6 ml/min (by C-G formula based on Cr of 0.83).  Basename 07/10/11 1450  VANCOTROUGH 12.7  VANCOPEAK --  VANCORANDOM --  GENTTROUGH --  GENTPEAK --  GENTRANDOM --  TOBRATROUGH --  TOBRAPEAK --  TOBRARND --  AMIKACINPEAK --  AMIKACINTROU --  AMIKACIN --     Microbiology: Recent Results (from the past 720 hour(s))  CULTURE, BLOOD (SINGLE)     Status: Normal   Collection Time   06/12/11 10:27 AM      Component Value Range Status Comment   Blood Culture, Routine Culture, Blood   Final Final - ===== FINAL REPORT =====NO GROWTH 5 DAYS  URINE CULTURE     Status: Normal   Collection Time   06/12/11  3:04 PM      Component Value Range Status Comment   Specimen Description URINE, CLEAN CATCH   Final    Special Requests none Immunocompromised   Final    Culture  Setup Time [id number]   Final    Colony Count NO GROWTH   Final    Culture NO GROWTH   Final    Report Status 06/13/2011 FINAL   Final   CULTURE, BLOOD  (ROUTINE X 2)     Status: Normal (Preliminary result)   Collection Time   07/08/11 10:33 PM      Component Value Range Status Comment   Specimen Description BLOOD PORTA CATH   Final    Special Requests BOTTLES DRAWN AEROBIC AND ANAEROBIC 5CC   Final    Culture  Setup Time [id number]   Final    Culture     Final    Value:        BLOOD CULTURE RECEIVED NO GROWTH TO DATE CULTURE WILL BE HELD FOR 5 DAYS BEFORE ISSUING A FINAL NEGATIVE REPORT   Report Status PENDING   Incomplete   CULTURE, BLOOD (ROUTINE X 2)     Status: Normal (Preliminary result)   Collection Time   07/08/11 10:40 PM      Component Value Range Status Comment   Specimen Description BLOOD RIGHT ARM   Final    Special Requests BOTTLES DRAWN AEROBIC AND ANAEROBIC 4CC   Final    Culture  Setup Time [id number]   Final    Culture     Final    Value:        BLOOD CULTURE RECEIVED NO GROWTH TO DATE CULTURE WILL BE HELD FOR 5 DAYS BEFORE ISSUING A FINAL NEGATIVE REPORT   Report Status PENDING   Incomplete  Medical History: Past Medical History  Diagnosis Date  . Hypertension   . Obesity   . Diverticulitis   . Hemorrhoids   . Colonic polyp   . GERD (gastroesophageal reflux disease)   . Cancer     BREAST, FALLOPIAN TUBE,    Medications:  Anti-infectives     Start     Dose/Rate Route Frequency Ordered Stop   07/10/11 1800   vancomycin (VANCOCIN) 1,250 mg in sodium chloride 0.9 % 250 mL IVPB        1,250 mg 166.7 mL/hr over 90 Minutes Intravenous Every 12 hours 07/10/11 1720     07/10/11 1600   vancomycin (VANCOCIN) IVPB 1000 mg/200 mL premix  Status:  Discontinued        1,000 mg 200 mL/hr over 60 Minutes Intravenous Every 12 hours 07/10/11 0433 07/10/11 1718   07/09/11 1200   piperacillin-tazobactam (ZOSYN) IVPB 3.375 g        3.375 g 12.5 mL/hr over 240 Minutes Intravenous Every 8 hours 07/09/11 1003     07/09/11 1000   vancomycin (VANCOCIN) IVPB 1000 mg/200 mL premix  Status:  Discontinued        1,000  mg 200 mL/hr over 60 Minutes Intravenous Every 12 hours 07/09/11 0447 07/10/11 0433   07/09/11 0800   piperacillin-tazobactam (ZOSYN) IVPB 3.375 g  Status:  Discontinued        3.375 g 12.5 mL/hr over 240 Minutes Intravenous Every 8 hours 07/09/11 0447 07/09/11 1003   07/09/11 0600   piperacillin-tazobactam (ZOSYN) IVPB 3.375 g  Status:  Discontinued        3.375 g 12.5 mL/hr over 240 Minutes Intravenous 3 times per day 07/09/11 0047 07/09/11 0447   07/09/11 0100   piperacillin-tazobactam (ZOSYN) IVPB 3.375 g        3.375 g 100 mL/hr over 30 Minutes Intravenous  Once 07/09/11 0044 07/09/11 0335   07/09/11 0045   vancomycin (VANCOCIN) IVPB 1000 mg/200 mL premix        1,000 mg 200 mL/hr over 60 Minutes Intravenous  Once 07/09/11 0044 07/09/11 0330         Assessment: Patient with sepsis.   Vanc trough drawn at 1450. Vanc=12.7 Patient was off the floor for a procedure and did not receive 4pm !gm dose of Vancomycin.  Goal of Therapy:  Vancomycin trough level 15-20 mcg/ml Zosyn based on renal function   Plan:  Increase Vancomycin to 1250mg  IV q 12 hours to begin at 6pm Zosyn 3.375g IV Q8H infused over 4hrs.   Loletta Specter 07/10/2011,5:23 PM

## 2011-07-10 NOTE — Evaluation (Signed)
Physical Therapy Evaluation Patient Details Name: Abigail [name] MRN: [id number] DOB: 11-Oct-1955 Today's Date: 07/10/2011 Time: 5366-4403 PT Time Calculation (min): 14 min  PT Assessment / Plan / Recommendation Clinical Impression  Pt admitted for fever and anemia and diagnosed with sepsis.  Pt would benefit from acute PT services in order to improve independence with ambulation and increase activity tolerance for d/c home alone.  Pt reports dyspnea on exertion and states she does have chairs around home to sit and rest if needed.  Pt expresses desire for assist at home, possible [facility] aide?, and agreeable to HHPT.      PT Assessment  Patient needs continued PT services    Follow Up Recommendations  Home health PT    Barriers to Discharge        lEquipment Recommendations  None recommended by PT    Recommendations for Other Services     Frequency Min 3X/week    Precautions / Restrictions     Pertinent Vitals/Pain Pre-activity: 118/82 mmHg, 124 bpm, 100% During ambulation: SaO2 >95%, HR 125-150 bpm Post ambulation: 100%, 130 bpm      Mobility  Transfers Transfers: Stand to Sit;Sit to Stand Sit to Stand: 5: Supervision;From bed Stand to Sit: 5: Supervision;To bed Ambulation/Gait Ambulation/Gait Assistance: 4: Min guard Ambulation Distance (Feet): 120 Feet (x2) Assistive device: None Ambulation/Gait Assistance Details: pt pushed IV pole part of the time, no unsteady gait observed, 3/4 dyspnea on exertion requiring rest break leaning against wall for 2-3 minutes, SaO2 remained above 95% during ambulation, HR elevated to 150 bpm with ambulation. Gait Pattern: Step-through pattern;Wide base of support Gait velocity: decreased    Exercises     PT Diagnosis: Difficulty walking  PT Problem List: Decreased activity tolerance;Decreased mobility;Decreased knowledge of use of DME;Obesity PT Treatment Interventions: DME instruction;Gait training;Functional mobility  training;Therapeutic activities;Therapeutic exercise;Patient/family education   PT Goals Acute Rehab PT Goals PT Goal Formulation: With patient Time For Goal Achievement: 07/17/11 Potential to Achieve Goals: Good Pt will go Sit to Stand: with modified independence PT Goal: Sit to Stand - Progress: Goal set today Pt will go Stand to Sit: with modified independence PT Goal: Stand to Sit - Progress: Goal set today Pt will Ambulate: >150 feet;with modified independence;with least restrictive assistive device;Other (comment) (dyspnea 2/4) PT Goal: Ambulate - Progress: Goal set today  Visit Information  Last PT Received On: 07/10/11 Assistance Needed: +1    Subjective Data  Subjective: "I get SOB recently when I walk to the mailbox."   Prior Functioning  Home Living Lives With: Alone Type of Home: House Home Access: Level entry Home Layout: One level Home Adaptive Equipment: None Prior Function Level of Independence: Independent Comments: Pt reports she is usually independent but is having a hard time keeping up with activites at home and requests possible [facility] aide. Communication Communication: No difficulties    Cognition  Overall Cognitive Status: Appears within functional limits for tasks assessed/performed Arousal/Alertness: Awake/alert Orientation Level: Appears intact for tasks assessed Behavior During Session: [facility] for tasks performed    Extremity/Trunk Assessment Right Lower Extremity Assessment RLE ROM/Strength/Tone: [facility] for tasks assessed Left Lower Extremity Assessment LLE ROM/Strength/Tone: [facility] for tasks assessed   Balance    End of Session PT - End of Session Activity Tolerance: Other (comment) (DOE) Patient left: in chair;with call bell/phone within reach   [facility] E 07/10/2011, 11:47 AM Pager: [phone]

## 2011-07-10 NOTE — Progress Notes (Signed)
Subjective: Lying in bed watching TV. Smiling, states "I feel much better". Also reports difficulty sleeping last night. Denies pain/discomfort. No events during night.   Objective: Vital signs Filed Vitals:   07/09/11 2101 07/10/11 0508 07/10/11 0943 07/10/11 1038  BP: 134/84 130/85 113/77 118/82  Pulse: 106 108    Temp: 99.6 F (37.6 C) 99.8 F (37.7 C)    TempSrc: Oral Oral    Resp: 18 18    Height:      Weight:      SpO2: 98% 100%     Weight change:  Last BM Date: 07/08/11  Intake/Output from previous day: 06/03 0701 - 06/04 0700 In: 1867.5 [P.O.:480; I.V.:550; Blood:487.5; IV Piggyback:350] Out: 2750 [Urine:2750]     Physical Exam: General: Alert, awake, oriented x3, in no acute distress. HEENT: No bruits, no goiter. PERRL mucus membranes moist/pink.  Heart: Regular rate and rhythm, without murmurs, rubs, gallops. Trace LEE Lungs:  Normal effort. Breath sounds clear to auscultation bilaterally. No wheeze Abdomen:  Obese, Soft, nontender, nondistended, positive bowel sounds. Extremities: No clubbing cyanosis with positive pedal pulses. Neuro: Grossly intact, nonfocal. Speech clear.     Lab Results: Basic Metabolic Panel:  Basename 07/10/11 0335 07/09/11 0155  NA 131* 132*  K 4.6 3.2*  CL 98 97  CO2 22 21  GLUCOSE 103* 111*  BUN 8 10  CREATININE 0.83 0.92  CALCIUM 8.6 8.3*  MG 1.0* 0.7*  PHOS -- --   Liver Function Tests:  Basename 07/09/11 0155 07/08/11 2146  AST 14 14  ALT 12 13  ALKPHOS 115 115  BILITOT 0.3 0.3  PROT 6.5 7.1  ALBUMIN 3.0* 3.1*   No results found for this basename: LIPASE:2,AMYLASE:2 in the last 72 hours No results found for this basename: AMMONIA:2 in the last 72 hours CBC:  Basename 07/10/11 0335 07/09/11 1340 07/09/11 0155 07/08/11 2146  WBC 13.3* -- 13.3* --  NEUTROABS -- -- -- 11.4*  HGB 8.7* 9.5* -- --  HCT 25.2* 27.0* -- --  MCV 97.7 -- 102.5* --  PLT 58* -- 73* --   Cardiac Enzymes: No results found for this  basename: CKTOTAL:3,CKMB:3,CKMBINDEX:3,TROPONINI:3 in the last 72 hours BNP: No results found for this basename: PROBNP:3 in the last 72 hours D-Dimer: No results found for this basename: DDIMER:2 in the last 72 hours CBG: No results found for this basename: GLUCAP:6 in the last 72 hours Hemoglobin A1C: No results found for this basename: HGBA1C in the last 72 hours Fasting Lipid Panel: No results found for this basename: CHOL,HDL,LDLCALC,TRIG,CHOLHDL,LDLDIRECT in the last 72 hours Thyroid Function Tests: No results found for this basename: TSH,T4TOTAL,FREET4,T3FREE,THYROIDAB in the last 72 hours Anemia Panel: No results found for this basename: VITAMINB12,FOLATE,FERRITIN,TIBC,IRON,RETICCTPCT in the last 72 hours Coagulation: No results found for this basename: LABPROT:2,INR:2 in the last 72 hours Urine Drug Screen: Drugs of Abuse  No results found for this basename: labopia, cocainscrnur, labbenz, amphetmu, thcu, labbarb    Alcohol Level: No results found for this basename: ETH:2 in the last 72 hours Urinalysis:  Basename 07/09/11 0015  COLORURINE YELLOW  LABSPEC 1.017  PHURINE 5.5  GLUCOSEU NEGATIVE  HGBUR NEGATIVE  BILIRUBINUR NEGATIVE  KETONESUR NEGATIVE  PROTEINUR NEGATIVE  UROBILINOGEN 0.2  NITRITE NEGATIVE  LEUKOCYTESUR NEGATIVE   Misc. Labs:  Recent Results (from the past 240 hour(s))  CULTURE, BLOOD (ROUTINE X 2)     Status: Normal (Preliminary result)   Collection Time   07/08/11 10:33 PM      Component  Value Range Status Comment   Specimen Description BLOOD PORTA CATH   Final    Special Requests BOTTLES DRAWN AEROBIC AND ANAEROBIC 5CC   Final    Culture  Setup Time [id number]   Final    Culture     Final    Value:        BLOOD CULTURE RECEIVED NO GROWTH TO DATE CULTURE WILL BE HELD FOR 5 DAYS BEFORE ISSUING A FINAL NEGATIVE REPORT   Report Status PENDING   Incomplete   CULTURE, BLOOD (ROUTINE X 2)     Status: Normal (Preliminary result)   Collection Time    07/08/11 10:40 PM      Component Value Range Status Comment   Specimen Description BLOOD RIGHT ARM   Final    Special Requests BOTTLES DRAWN AEROBIC AND ANAEROBIC 4CC   Final    Culture  Setup Time [id number]   Final    Culture     Final    Value:        BLOOD CULTURE RECEIVED NO GROWTH TO DATE CULTURE WILL BE HELD FOR 5 DAYS BEFORE ISSUING A FINAL NEGATIVE REPORT   Report Status PENDING   Incomplete     Studies/Results: Dg Chest 2 View  07/08/2011  *RADIOLOGY REPORT*  Clinical Data: Shortness of breath.  CHEST - 2 VIEW  Comparison: 06/12/2011.  Findings: The power port is stable.  The heart is enlarged but unchanged.  The low lung volumes with vascular crowding and bibasilar atelectasis.  No infiltrates, edema or effusions.  The bony thorax is intact.  Stable degenerative changes involving the thoracic spine.  IMPRESSION:  1.  Cardiac enlargement, unchanged. 2.  Low lung volumes with vascular crowding and bibasilar atelectasis.  Original Report Authenticated By: P. Loralie Champagne, M.D.    Medications: Scheduled Meds:   . cholecalciferol  3,000 Units Oral Daily  . docusate sodium  100 mg Oral BID  . lisinopril  10 mg Oral Daily  . magnesium sulfate 1 - 4 g bolus IVPB  4 g Intravenous Once  . piperacillin-tazobactam (ZOSYN)  IV  3.375 g Intravenous Q8H  . potassium chloride  40 mEq Oral Q4H  . potassium chloride  40 mEq Oral Once  . vancomycin  1,000 mg Intravenous Q12H  . DISCONTD: vancomycin  1,000 mg Intravenous Q12H   Continuous Infusions:   . 0.9 % NaCl with KCl 20 mEq / L 50 mL/hr at 07/09/11 0338   PRN Meds:.acetaminophen, acetaminophen, alum & mag hydroxide-simeth, bisacodyl, LORazepam, ondansetron (ZOFRAN) IV, ondansetron, prochlorperazine, sodium chloride  Assessment/Plan:  Principal Problem:  *Sepsis Active Problems:  Hyponatremia  Anemia  Thrombocytopenia 1. sepsis. Source remains unclear, has no infiltrates on chest x-rays blood cultures pending Port-A-Cath  site looks clean urinalysis is negative. White count stable at 13. Fever 99. Will continue broad-spectrum empiric antibiotic coverage cultures are back. Vanc/Zosyn day #2. Pt feeling much better. Will discuss with Dr. Donnie Coffin possibility of porta cath removal if no improvement. Remains slightly tachycardic as well. o2 sats stable at 93 on room air. Will check d-dimer 2 anemia. This is most likely anemia of chronic disease and secondary to chemotherapy. S/P 2 units PRBC.  Tachycardia improved.   3. Hyponatremia. Likely related to dehydration and possibly chronic related to chemo. Stable at 131-132.  Recheck in am.  4. Thrombocytopenia. This is most likely due to chemotherapy. Hold chemical anticoagulation will monitor . Continues to trend downward. No s/sx bleeding.  5. Hypomagnesemia: replete and recheck.  5.  Recurrent fallopian tumor. Will notify Dr. Renelda Loma office of pt admission. See #1. Patient will follow with hematology oncology as an outpatient upon discharge.  6 HTN. SBP range 113-134.. Will continue to hold norvasc and HCTZ. Continue lisinopril. Monitor closely.  7. Mild hypokalemia. Likely related to #1. Resolved. Monitor.   7. Code status: full     LOS: 2 days   [facility] M 07/10/2011, 1:34 PM

## 2011-07-11 ENCOUNTER — Ambulatory Visit: Payer: Medicare Other | Admitting: Gynecologic Oncology

## 2011-07-11 DIAGNOSIS — R509 Fever, unspecified: Secondary | ICD-10-CM

## 2011-07-11 DIAGNOSIS — R112 Nausea with vomiting, unspecified: Secondary | ICD-10-CM

## 2011-07-11 DIAGNOSIS — D649 Anemia, unspecified: Secondary | ICD-10-CM

## 2011-07-11 DIAGNOSIS — M311 Thrombotic microangiopathy: Secondary | ICD-10-CM

## 2011-07-11 DIAGNOSIS — C5700 Malignant neoplasm of unspecified fallopian tube: Secondary | ICD-10-CM

## 2011-07-11 DIAGNOSIS — E871 Hypo-osmolality and hyponatremia: Secondary | ICD-10-CM

## 2011-07-11 DIAGNOSIS — A413 Sepsis due to Hemophilus influenzae: Secondary | ICD-10-CM

## 2011-07-11 LAB — BASIC METABOLIC PANEL
BUN: 8 mg/dL (ref 6–23)
Chloride: 95 mEq/L — ABNORMAL LOW (ref 96–112)
Glucose, Bld: 100 mg/dL — ABNORMAL HIGH (ref 70–99)
Potassium: 3.9 mEq/L (ref 3.5–5.1)
Sodium: 128 mEq/L — ABNORMAL LOW (ref 135–145)

## 2011-07-11 LAB — CBC
HCT: 24.4 % — ABNORMAL LOW (ref 36.0–46.0)
Hemoglobin: 8.3 g/dL — ABNORMAL LOW (ref 12.0–15.0)
RBC: 2.51 MIL/uL — ABNORMAL LOW (ref 3.87–5.11)
WBC: 11.5 10*3/uL — ABNORMAL HIGH (ref 4.0–10.5)

## 2011-07-11 LAB — SODIUM, URINE, RANDOM: Sodium, Ur: 37 mEq/L

## 2011-07-11 LAB — MAGNESIUM: Magnesium: 1.4 mg/dL — ABNORMAL LOW (ref 1.5–2.5)

## 2011-07-11 MED ORDER — AZITHROMYCIN 250 MG PO TABS
250.0000 mg | ORAL_TABLET | Freq: Every day | ORAL | Status: DC
  Administered 2011-07-12 – 2011-07-13 (×2): 250 mg via ORAL
  Filled 2011-07-11 (×2): qty 1

## 2011-07-11 MED ORDER — SODIUM CHLORIDE 0.9 % IV SOLN: INTRAVENOUS | Status: DC

## 2011-07-11 MED ORDER — AZITHROMYCIN 500 MG PO TABS
500.0000 mg | ORAL_TABLET | Freq: Every day | ORAL | Status: AC
  Administered 2011-07-11: 500 mg via ORAL
  Filled 2011-07-11: qty 1

## 2011-07-11 NOTE — Progress Notes (Signed)
IP PROGRESS NOTE  Subjective:   56 year old woman well known history of BRCA2 abnormality with recurrent ovarian cancer, currently on carboplatin Taxol chemotherapy status post cycle 2 now admitted with fever  Objective:  Vital signs in last 24 hours: Temp:  [98.7 F (37.1 C)-99.5 F (37.5 C)] 99.5 F (37.5 C) (06/05 0542) Pulse Rate:  [103-110] 103  (06/05 0542) Resp:  [18-22] 22  (06/05 0542) BP: (106-132)/(71-78) 106/71 mmHg (06/05 0542) SpO2:  [99 %-100 %] 99 % (06/05 0542) Weight change:  Last BM Date: 07/11/11  Intake/Output from previous day: 06/04 0701 - 06/05 0700 In: 856.7 [P.O.:240; I.V.:16.7; IV Piggyback:600] Out: 1375 [Urine:1375]  Mouth: mucous membranes moist, pharynx normal without lesions Resp: clear to auscultation bilaterally and normal percussion bilaterally Cardio: regular rate and rhythm, S1, S2 normal, no murmur, click, rub or gallop and normal apical impulse GI: soft, non-tender; bowel sounds normal; no masses,  no organomegaly Extremities: extremities normal, atraumatic, no cyanosis or edema  Portacath/PICC-without erythema  Lab Results:  Basename 07/11/11 0400 07/10/11 0335  WBC 11.5* 13.3*  HGB 8.3* 8.7*  HCT 24.4* 25.2*  PLT 54* 58*    BMET  Basename 07/11/11 0400 07/10/11 0335  NA 128* 131*  K 3.9 4.6  CL 95* 98  CO2 23 22  GLUCOSE 100* 103*  BUN 8 8  CREATININE 0.82 0.83  CALCIUM 9.1 8.6    Studies/Results: Ct Angio Chest W/cm &/or Wo Cm  07/10/2011  *RADIOLOGY REPORT*  Clinical Data: Shortness of breath.  Evaluate for pulmonary embolism.  History of breast cancer.  CT ANGIOGRAPHY CHEST  Technique:  Multidetector CT imaging of the chest using the standard protocol during bolus administration of intravenous contrast. Multiplanar reconstructed images including MIPs were obtained and reviewed to evaluate the vascular anatomy.  Contrast: 80mL OMNIPAQUE IOHEXOL 300 MG/ML  SOLN  Comparison: CT of chest 10/24/2010.  Findings:   Mediastinum: There are no filling defects within the pulmonary arterial tree to suggest underlying pulmonary embolism. Heart size is borderline enlarged. Trace amount of pericardial fluid and / or thickening, unlikely to be of any hemodynamic significance at this time.  No associated pericardial calcification.  There are numerous calcified mediastinal and bilateral hilar lymph nodes.  No definite pathologically enlarged noncalcified mediastinal or hilar lymph nodes are appreciated.  There is a new right internal jugular single lumen Port-A-Cath with tip terminating in the right atrium. Esophagus is unremarkable in appearance.  Borderline enlarged (10 mm short axis) retrocrural lymph nodes are incidentally noted, of uncertain etiology and significance.  Lungs/Pleura: There are some patchy regions of peripheral subpleural reticulation, most pronounced in the lung bases. Additionally, in the inferior aspect of the lower lobes of the lungs bilaterally there is more profound reticulation with some associated traction bronchiectasis and peripheral bronchiolectasis. No acute airspace consolidation.  No pleural effusions.  The apices of the lungs bilaterally there is extensive peripheral micronodularity and pleural thickening, likely reflect post infectious scarring.  The largest single nodule measures approximately 6 mm in the right apex (image 22 of series 7).  No other larger more suspicious appearing pulmonary nodules or masses are otherwise identified.  Upper Abdomen: Unremarkable.  Musculoskeletal: There are no aggressive appearing lytic or blastic lesions noted in the visualized portions of the skeleton.  IMPRESSION: 1.  No evidence of pulmonary embolism. 2.  No acute findings in the visualized thorax to account for the patient's symptoms. 3.  However, the appearance of the lungs is suggestive of an underlying interstitial lung  disease.  The pattern is not definitive at this time, but this may represent an early  manifestation of usual interstitial pneumonia (UIP).  Attention on follow-up high-resolution CT scans would be of use to monitor for progression of disease. 4.  Sequelae of old granulomatous disease with extensive bilateral hilar and mediastinal calcified lymphadenopathy. 5.  Bilateral apical peripheral micronodularity, with the largest single nodule measuring approximately 6 mm in the right upper lobe. These findings are nonspecific, and favored to reflect post infectious scarring.  This pattern is not typical for metastatic disease. If the patient is at high risk for bronchogenic carcinoma, follow-up chest CT at 6-12 months is recommended.  If the patient is at low risk for bronchogenic carcinoma, follow-up chest CT at 12 months is recommended.  This recommendation follows the consensus statement: Guidelines for Management of Small Pulmonary Nodules Detected on CT Scans: A Statement from the Fleischner Society as published in Radiology 2005; 237:395-400.  This follow-up CT scan should be performed as a high resolution chest CT to moderate for progression of interstitial lung disease.  Original Report Authenticated By: Florencia Reasons, M.D.    Medications: I have reviewed the patient's current medications.  Assessment/Plan:  Pleasant 56 year old woman admitted with fever after receiving cycle 2 of chemotherapy. She documented after cycle one as well. There is been a CT scan showing some interstitial disease. 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 She was having a nonproductive cough. Her fevers resolved. Cultures are negative. And all likelihood there: White count may have been causing low-grade fever.   LOS: 3 days   Abigail [name] 07/11/2011, 11:52 AM

## 2011-07-11 NOTE — Progress Notes (Signed)
Subjective: Denies pain. No events during night.   Objective: Vital signs Filed Vitals:   07/10/11 1038 07/10/11 1434 07/10/11 2236 07/11/11 0542  BP: 118/82 132/78 110/75 106/71  Pulse:  104 110 103  Temp:  98.7 F (37.1 C) 98.7 F (37.1 C) 99.5 F (37.5 C)  TempSrc:  Oral Oral Oral  Resp:  18 20 22   Height:      Weight:      SpO2:  100% 100% 99%   Weight change:  Last BM Date: 07/11/11  Intake/Output from previous day: 06/04 0701 - 06/05 0700 In: 856.7 [P.O.:240; I.V.:16.7; IV Piggyback:600] Out: 1375 [Urine:1375] Total I/O In: 360 [P.O.:360] Out: 1050 [Urine:1050]   Physical Exam: General: Alert, awake, oriented x3, in no acute distress. HEENT: No bruits, no goiter. Heart: Regular rate and rhythm, without murmurs, rubs, gallops. Lungs:Normal effort. Breath sounds clear to auscultation bilaterally. Abdomen:Obese,  Soft, nontender, nondistended, positive bowel sounds. Extremities: No clubbing cyanosis or edema with positive pedal pulses. Neuro: Grossly intact, nonfocal. Speech clear.     Lab Results: Basic Metabolic Panel:  Basename 07/11/11 0400 07/10/11 0335 07/09/11 0155  NA 128* 131* --  K 3.9 4.6 --  CL 95* 98 --  CO2 23 22 --  GLUCOSE 100* 103* --  BUN 8 8 --  CREATININE 0.82 0.83 --  CALCIUM 9.1 8.6 --  MG -- 1.0* 0.7*  PHOS -- -- --   Liver Function Tests:  Basename 07/09/11 0155 07/08/11 2146  AST 14 14  ALT 12 13  ALKPHOS 115 115  BILITOT 0.3 0.3  PROT 6.5 7.1  ALBUMIN 3.0* 3.1*   No results found for this basename: LIPASE:2,AMYLASE:2 in the last 72 hours No results found for this basename: AMMONIA:2 in the last 72 hours CBC:  Basename 07/11/11 0400 07/10/11 0335 07/08/11 2146  WBC 11.5* 13.3* --  NEUTROABS -- -- 11.4*  HGB 8.3* 8.7* --  HCT 24.4* 25.2* --  MCV 97.2 97.7 --  PLT 54* 58* --   Cardiac Enzymes: No results found for this basename: CKTOTAL:3,CKMB:3,CKMBINDEX:3,TROPONINI:3 in the last 72 hours BNP: No results  found for this basename: PROBNP:3 in the last 72 hours D-Dimer: No results found for this basename: DDIMER:2 in the last 72 hours CBG: No results found for this basename: GLUCAP:6 in the last 72 hours Hemoglobin A1C: No results found for this basename: HGBA1C in the last 72 hours Fasting Lipid Panel: No results found for this basename: CHOL,HDL,LDLCALC,TRIG,CHOLHDL,LDLDIRECT in the last 72 hours Thyroid Function Tests: No results found for this basename: TSH,T4TOTAL,FREET4,T3FREE,THYROIDAB in the last 72 hours Anemia Panel: No results found for this basename: VITAMINB12,FOLATE,FERRITIN,TIBC,IRON,RETICCTPCT in the last 72 hours Coagulation: No results found for this basename: LABPROT:2,INR:2 in the last 72 hours Urine Drug Screen: Drugs of Abuse  No results found for this basename: labopia, cocainscrnur, labbenz, amphetmu, thcu, labbarb    Alcohol Level: No results found for this basename: ETH:2 in the last 72 hours Urinalysis:  Basename 07/09/11 0015  COLORURINE YELLOW  LABSPEC 1.017  PHURINE 5.5  GLUCOSEU NEGATIVE  HGBUR NEGATIVE  BILIRUBINUR NEGATIVE  KETONESUR NEGATIVE  PROTEINUR NEGATIVE  UROBILINOGEN 0.2  NITRITE NEGATIVE  LEUKOCYTESUR NEGATIVE   Misc. Labs:  Recent Results (from the past 240 hour(s))  CULTURE, BLOOD (ROUTINE X 2)     Status: Normal (Preliminary result)   Collection Time   07/08/11 10:33 PM      Component Value Range Status Comment   Specimen Description BLOOD PORTA CATH   Final  Special Requests BOTTLES DRAWN AEROBIC AND ANAEROBIC 5CC   Final    Culture  Setup Time [id number]   Final    Culture     Final    Value:        BLOOD CULTURE RECEIVED NO GROWTH TO DATE CULTURE WILL BE HELD FOR 5 DAYS BEFORE ISSUING A FINAL NEGATIVE REPORT   Report Status PENDING   Incomplete   CULTURE, BLOOD (ROUTINE X 2)     Status: Normal (Preliminary result)   Collection Time   07/08/11 10:40 PM      Component Value Range Status Comment   Specimen Description  BLOOD RIGHT ARM   Final    Special Requests BOTTLES DRAWN AEROBIC AND ANAEROBIC 4CC   Final    Culture  Setup Time [id number]   Final    Culture     Final    Value:        BLOOD CULTURE RECEIVED NO GROWTH TO DATE CULTURE WILL BE HELD FOR 5 DAYS BEFORE ISSUING A FINAL NEGATIVE REPORT   Report Status PENDING   Incomplete   URINE CULTURE     Status: Normal (Preliminary result)   Collection Time   07/09/11 12:15 AM      Component Value Range Status Comment   Specimen Description URINE, CLEAN CATCH   Final    Special Requests NONE   Final    Culture  Setup Time [id number]   Final    Colony Count 10,000 COLONIES/ML   Final    Culture     Final    Value: STAPHYLOCOCCUS SPECIES (COAGULASE NEGATIVE)     Note: RIFAMPIN AND GENTAMICIN SHOULD NOT BE USED AS SINGLE DRUGS FOR TREATMENT OF STAPH INFECTIONS.   Report Status PENDING   Incomplete     Studies/Results: Ct Angio Chest W/cm &/or Wo Cm  07/10/2011  *RADIOLOGY REPORT*  Clinical Data: Shortness of breath.  Evaluate for pulmonary embolism.  History of breast cancer.  CT ANGIOGRAPHY CHEST  Technique:  Multidetector CT imaging of the chest using the standard protocol during bolus administration of intravenous contrast. Multiplanar reconstructed images including MIPs were obtained and reviewed to evaluate the vascular anatomy.  Contrast: 80mL OMNIPAQUE IOHEXOL 300 MG/ML  SOLN  Comparison: CT of chest 10/24/2010.  Findings:  Mediastinum: There are no filling defects within the pulmonary arterial tree to suggest underlying pulmonary embolism. Heart size is borderline enlarged. Trace amount of pericardial fluid and / or thickening, unlikely to be of any hemodynamic significance at this time.  No associated pericardial calcification.  There are numerous calcified mediastinal and bilateral hilar lymph nodes.  No definite pathologically enlarged noncalcified mediastinal or hilar lymph nodes are appreciated.  There is a new right internal jugular single lumen  Port-A-Cath with tip terminating in the right atrium. Esophagus is unremarkable in appearance.  Borderline enlarged (10 mm short axis) retrocrural lymph nodes are incidentally noted, of uncertain etiology and significance.  Lungs/Pleura: There are some patchy regions of peripheral subpleural reticulation, most pronounced in the lung bases. Additionally, in the inferior aspect of the lower lobes of the lungs bilaterally there is more profound reticulation with some associated traction bronchiectasis and peripheral bronchiolectasis. No acute airspace consolidation.  No pleural effusions.  The apices of the lungs bilaterally there is extensive peripheral micronodularity and pleural thickening, likely reflect post infectious scarring.  The largest single nodule measures approximately 6 mm in the right apex (image 22 of series 7).  No other larger more suspicious appearing pulmonary  nodules or masses are otherwise identified.  Upper Abdomen: Unremarkable.  Musculoskeletal: There are no aggressive appearing lytic or blastic lesions noted in the visualized portions of the skeleton.  IMPRESSION: 1.  No evidence of pulmonary embolism. 2.  No acute findings in the visualized thorax to account for the patient's symptoms. 3.  However, the appearance of the lungs is suggestive of an underlying interstitial lung disease.  The pattern is not definitive at this time, but this may represent an early manifestation of usual interstitial pneumonia (UIP).  Attention on follow-up high-resolution CT scans would be of use to monitor for progression of disease. 4.  Sequelae of old granulomatous disease with extensive bilateral hilar and mediastinal calcified lymphadenopathy. 5.  Bilateral apical peripheral micronodularity, with the largest single nodule measuring approximately 6 mm in the right upper lobe. These findings are nonspecific, and favored to reflect post infectious scarring.  This pattern is not typical for metastatic disease. If  the patient is at high risk for bronchogenic carcinoma, follow-up chest CT at 6-12 months is recommended.  If the patient is at low risk for bronchogenic carcinoma, follow-up chest CT at 12 months is recommended.  This recommendation follows the consensus statement: Guidelines for Management of Small Pulmonary Nodules Detected on CT Scans: A Statement from the Fleischner Society as published in Radiology 2005; 237:395-400.  This follow-up CT scan should be performed as a high resolution chest CT to moderate for progression of interstitial lung disease.  Original Report Authenticated By: Florencia Reasons, M.D.    Medications: Scheduled Meds:   . cholecalciferol  3,000 Units Oral Daily  . docusate sodium  100 mg Oral BID  . lisinopril  10 mg Oral Daily  . piperacillin-tazobactam (ZOSYN)  IV  3.375 g Intravenous Q8H  . vancomycin  1,250 mg Intravenous Q12H  . DISCONTD: vancomycin  1,000 mg Intravenous Q12H   Continuous Infusions:   . DISCONTD: sodium chloride 50 mL/hr at 07/11/11 0639   PRN Meds:.acetaminophen, acetaminophen, alum & mag hydroxide-simeth, bisacodyl, iohexol, LORazepam, ondansetron (ZOFRAN) IV, ondansetron, prochlorperazine, sodium chloride  Assessment/Plan:  Principal Problem:  *Sepsis Active Problems:  Hyponatremia  Anemia  Thrombocytopenia 1. sepsis. Source remains unclear, has no infiltrates on chest x-ray. blood cultures per Port-A-Cath with no growth to date. Site looks clean urinalysis is negative. Urine culture with staph.  CT chest yesterday somewhat concerning for an atypical pna.  Will continue broad-spectrum c antibiotic coverage. Vanc/Zosyn day #3. Pt continues to feel better.  o2 sats stable at 99 on room air. Remains tachycardic 103-110. CT chest neg PE 2 anemia. This is most likely anemia of chronic disease and secondary to chemotherapy. S/P 2 units PRBC. Tachycardia stable. 3. Hyponatremia. Likely related to dehydration and possibly chronic related to  chemo. Worsening  Today. Will check urine sodium and osmolality and serum osmolality. Discontinue IV fluids. Monitor closely  4. Thrombocytopenia. This is most likely due to chemotherapy. Hold chemical anticoagulation will monitor .  Stable in 50's range.  No s/sx bleeding.  5. Hypomagnesemia: repleted yesterday. Mag level pending.   5. Recurrent fallopian tumor. Will notify Dr. Renelda Loma office of pt admission. See #1. Patient will follow with hematology oncology as an outpatient upon discharge.  6 HTN. SBP range 106-134.. Will continue to hold norvasc and HCTZ. Continue lisinopril. Monitor closely.  7. Mild hypokalemia. Likely related to #1. Resolved. Monitor.     LOS: 3 days   [facility] M 07/11/2011, 11:44 AM

## 2011-07-11 NOTE — Progress Notes (Addendum)
I have directly reviewed the clinical findings, lab, imaging studies and management of this patient in detail. I have interviewed and examined the patient and agree with the documentation,  as recorded by the Physician extender.  D/W Dr Donnie Coffin Mardee Postin added for ? atypical PNA on CT, outpt Pulm follow up for other CT findings.    Leroy Sea M.D on 07/11/2011 at 12:43 PM  [facility] Office  [phone]

## 2011-07-12 DIAGNOSIS — A413 Sepsis due to Hemophilus influenzae: Secondary | ICD-10-CM

## 2011-07-12 DIAGNOSIS — M311 Thrombotic microangiopathy: Secondary | ICD-10-CM

## 2011-07-12 DIAGNOSIS — R112 Nausea with vomiting, unspecified: Secondary | ICD-10-CM

## 2011-07-12 DIAGNOSIS — E871 Hypo-osmolality and hyponatremia: Secondary | ICD-10-CM

## 2011-07-12 LAB — CBC
Hemoglobin: 8.1 g/dL — ABNORMAL LOW (ref 12.0–15.0)
MCH: 33.5 pg (ref 26.0–34.0)
MCHC: 34.2 g/dL (ref 30.0–36.0)
Platelets: 54 10*3/uL — ABNORMAL LOW (ref 150–400)
RBC: 2.42 MIL/uL — ABNORMAL LOW (ref 3.87–5.11)

## 2011-07-12 LAB — BASIC METABOLIC PANEL
CO2: 22 mEq/L (ref 19–32)
Calcium: 9 mg/dL (ref 8.4–10.5)
GFR calc non Af Amer: 74 mL/min — ABNORMAL LOW (ref >90)
Glucose, Bld: 87 mg/dL (ref 70–99)
Potassium: 3.8 mEq/L (ref 3.5–5.1)
Sodium: 128 mEq/L — ABNORMAL LOW (ref 135–145)

## 2011-07-12 LAB — URINE CULTURE: Colony Count: 10000

## 2011-07-12 LAB — OSMOLALITY, URINE: Osmolality, Ur: 215 mOsm/kg — ABNORMAL LOW (ref 390–1090)

## 2011-07-12 MED ORDER — MAGNESIUM OXIDE 400 (241.3 MG) MG PO TABS
400.0000 mg | ORAL_TABLET | Freq: Once | ORAL | Status: AC
  Administered 2011-07-12: 400 mg via ORAL
  Filled 2011-07-12: qty 1

## 2011-07-12 MED ORDER — SODIUM CHLORIDE 0.9 % IV SOLN
INTRAVENOUS | Status: AC
  Administered 2011-07-12: 11:00:00 via INTRAVENOUS

## 2011-07-12 MED ORDER — MAGNESIUM SULFATE IN D5W 10-5 MG/ML-% IV SOLN
1.0000 g | Freq: Once | INTRAVENOUS | Status: AC
  Administered 2011-07-12: 1 g via INTRAVENOUS
  Filled 2011-07-12: qty 100

## 2011-07-12 MED ORDER — SODIUM CHLORIDE 0.9 % IV SOLN
INTRAVENOUS | Status: DC
  Administered 2011-07-12: 07:00:00 via INTRAVENOUS

## 2011-07-12 NOTE — Progress Notes (Signed)
I have directly reviewed the clinical findings, lab, imaging studies and management of this patient in detail. I have interviewed and examined the patient and agree with the documentation,  as recorded by the Physician extender.  Leroy Sea M.D on 07/12/2011 at 12:04 PM  [facility] Office  [phone]

## 2011-07-12 NOTE — Progress Notes (Signed)
Subjective: Sitting in chair watching TV. Denies pain/discomfort. NAD. No events during night.   Objective: Vital signs Filed Vitals:   07/11/11 0542 07/11/11 1425 07/11/11 2039 07/12/11 0606  BP: 106/71 107/73 122/73 122/72  Pulse: 103 105 105 104  Temp: 99.5 F (37.5 C) 99 F (37.2 C) 99.2 F (37.3 C) 98.7 F (37.1 C)  TempSrc: Oral Oral Oral Oral  Resp: 22 16 18 18   Height:      Weight:      SpO2: 99% 100% 100% 100%   Weight change:  Last BM Date: 07/11/11  Intake/Output from previous day: 06/05 0701 - 06/06 0700 In: 2280 [P.O.:1440; I.V.:240; IV Piggyback:600] Out: 2401 [Urine:2400; Stool:1] Total I/O In: 360 [P.O.:360] Out: 350 [Urine:350]   Physical Exam: General: Alert, awake, oriented x3, in no acute distress. HEENT: No bruits, no goiter. Mucus membranes mouth moist/pink. PERRL Heart: Regular rate and rhythm, without murmurs, rubs, gallops. Lungs:  Normal effort Breath sounds slightly coarse but no wheeze, rales. Abdomen:  Obese, Soft, nontender, nondistended, positive bowel sounds. Extremities: No clubbing cyanosis or edema with positive pedal pulses. Neuro: Grossly intact, nonfocal. Speech clear.     Lab Results: Basic Metabolic Panel:  Basename 07/12/11 0400 07/11/11 1230 07/11/11 0400 07/10/11 0335  NA 128* -- 128* --  K 3.8 -- 3.9 --  CL 95* -- 95* --  CO2 22 -- 23 --  GLUCOSE 87 -- 100* --  BUN 7 -- 8 --  CREATININE 0.85 -- 0.82 --  CALCIUM 9.0 -- 9.1 --  MG -- 1.4* -- 1.0*  PHOS -- -- -- --   Liver Function Tests: No results found for this basename: AST:2,ALT:2,ALKPHOS:2,BILITOT:2,PROT:2,ALBUMIN:2 in the last 72 hours No results found for this basename: LIPASE:2,AMYLASE:2 in the last 72 hours No results found for this basename: AMMONIA:2 in the last 72 hours CBC:  Basename 07/12/11 0400 07/11/11 0400  WBC 11.5* 11.5*  NEUTROABS -- --  HGB 8.1* 8.3*  HCT 23.7* 24.4*  MCV 97.9 97.2  PLT 54* 54*   Cardiac Enzymes: No results found  for this basename: CKTOTAL:3,CKMB:3,CKMBINDEX:3,TROPONINI:3 in the last 72 hours BNP: No results found for this basename: PROBNP:3 in the last 72 hours D-Dimer: No results found for this basename: DDIMER:2 in the last 72 hours CBG: No results found for this basename: GLUCAP:6 in the last 72 hours Hemoglobin A1C: No results found for this basename: HGBA1C in the last 72 hours Fasting Lipid Panel: No results found for this basename: CHOL,HDL,LDLCALC,TRIG,CHOLHDL,LDLDIRECT in the last 72 hours Thyroid Function Tests: No results found for this basename: TSH,T4TOTAL,FREET4,T3FREE,THYROIDAB in the last 72 hours Anemia Panel: No results found for this basename: VITAMINB12,FOLATE,FERRITIN,TIBC,IRON,RETICCTPCT in the last 72 hours Coagulation: No results found for this basename: LABPROT:2,INR:2 in the last 72 hours Urine Drug Screen: Drugs of Abuse  No results found for this basename: labopia, cocainscrnur, labbenz, amphetmu, thcu, labbarb    Alcohol Level: No results found for this basename: ETH:2 in the last 72 hours Urinalysis: No results found for this basename: COLORURINE:2,APPERANCEUR:2,LABSPEC:2,PHURINE:2,GLUCOSEU:2,HGBUR:2,BILIRUBINUR:2,KETONESUR:2,PROTEINUR:2,UROBILINOGEN:2,NITRITE:2,LEUKOCYTESUR:2 in the last 72 hours Misc. Labs:  Recent Results (from the past 240 hour(s))  CULTURE, BLOOD (ROUTINE X 2)     Status: Normal (Preliminary result)   Collection Time   07/08/11 10:33 PM      Component Value Range Status Comment   Specimen Description BLOOD PORTA CATH   Final    Special Requests BOTTLES DRAWN AEROBIC AND ANAEROBIC 5CC   Final    Culture  Setup Time [id number]  Final    Culture     Final    Value:        BLOOD CULTURE RECEIVED NO GROWTH TO DATE CULTURE WILL BE HELD FOR 5 DAYS BEFORE ISSUING A FINAL NEGATIVE REPORT   Report Status PENDING   Incomplete   CULTURE, BLOOD (ROUTINE X 2)     Status: Normal (Preliminary result)   Collection Time   07/08/11 10:40 PM       Component Value Range Status Comment   Specimen Description BLOOD RIGHT ARM   Final    Special Requests BOTTLES DRAWN AEROBIC AND ANAEROBIC 4CC   Final    Culture  Setup Time [id number]   Final    Culture     Final    Value:        BLOOD CULTURE RECEIVED NO GROWTH TO DATE CULTURE WILL BE HELD FOR 5 DAYS BEFORE ISSUING A FINAL NEGATIVE REPORT   Report Status PENDING   Incomplete   URINE CULTURE     Status: Normal (Preliminary result)   Collection Time   07/09/11 12:15 AM      Component Value Range Status Comment   Specimen Description URINE, CLEAN CATCH   Final    Special Requests NONE   Final    Culture  Setup Time [id number]   Final    Colony Count 10,000 COLONIES/ML   Final    Culture     Final    Value: STAPHYLOCOCCUS SPECIES (COAGULASE NEGATIVE)     Note: RIFAMPIN AND GENTAMICIN SHOULD NOT BE USED AS SINGLE DRUGS FOR TREATMENT OF STAPH INFECTIONS.   Report Status PENDING   Incomplete     Studies/Results: Ct Angio Chest W/cm &/or Wo Cm  07/10/2011  *RADIOLOGY REPORT*  Clinical Data: Shortness of breath.  Evaluate for pulmonary embolism.  History of breast cancer.  CT ANGIOGRAPHY CHEST  Technique:  Multidetector CT imaging of the chest using the standard protocol during bolus administration of intravenous contrast. Multiplanar reconstructed images including MIPs were obtained and reviewed to evaluate the vascular anatomy.  Contrast: 80mL OMNIPAQUE IOHEXOL 300 MG/ML  SOLN  Comparison: CT of chest 10/24/2010.  Findings:  Mediastinum: There are no filling defects within the pulmonary arterial tree to suggest underlying pulmonary embolism. Heart size is borderline enlarged. Trace amount of pericardial fluid and / or thickening, unlikely to be of any hemodynamic significance at this time.  No associated pericardial calcification.  There are numerous calcified mediastinal and bilateral hilar lymph nodes.  No definite pathologically enlarged noncalcified mediastinal or hilar lymph nodes are  appreciated.  There is a new right internal jugular single lumen Port-A-Cath with tip terminating in the right atrium. Esophagus is unremarkable in appearance.  Borderline enlarged (10 mm short axis) retrocrural lymph nodes are incidentally noted, of uncertain etiology and significance.  Lungs/Pleura: There are some patchy regions of peripheral subpleural reticulation, most pronounced in the lung bases. Additionally, in the inferior aspect of the lower lobes of the lungs bilaterally there is more profound reticulation with some associated traction bronchiectasis and peripheral bronchiolectasis. No acute airspace consolidation.  No pleural effusions.  The apices of the lungs bilaterally there is extensive peripheral micronodularity and pleural thickening, likely reflect post infectious scarring.  The largest single nodule measures approximately 6 mm in the right apex (image 22 of series 7).  No other larger more suspicious appearing pulmonary nodules or masses are otherwise identified.  Upper Abdomen: Unremarkable.  Musculoskeletal: There are no aggressive appearing lytic or blastic lesions  noted in the visualized portions of the skeleton.  IMPRESSION: 1.  No evidence of pulmonary embolism. 2.  No acute findings in the visualized thorax to account for the patient's symptoms. 3.  However, the appearance of the lungs is suggestive of an underlying interstitial lung disease.  The pattern is not definitive at this time, but this may represent an early manifestation of usual interstitial pneumonia (UIP).  Attention on follow-up high-resolution CT scans would be of use to monitor for progression of disease. 4.  Sequelae of old granulomatous disease with extensive bilateral hilar and mediastinal calcified lymphadenopathy. 5.  Bilateral apical peripheral micronodularity, with the largest single nodule measuring approximately 6 mm in the right upper lobe. These findings are nonspecific, and favored to reflect post infectious  scarring.  This pattern is not typical for metastatic disease. If the patient is at high risk for bronchogenic carcinoma, follow-up chest CT at 6-12 months is recommended.  If the patient is at low risk for bronchogenic carcinoma, follow-up chest CT at 12 months is recommended.  This recommendation follows the consensus statement: Guidelines for Management of Small Pulmonary Nodules Detected on CT Scans: A Statement from the Fleischner Society as published in Radiology 2005; 237:395-400.  This follow-up CT scan should be performed as a high resolution chest CT to moderate for progression of interstitial lung disease.  Original Report Authenticated By: Florencia Reasons, M.D.    Medications: Scheduled Meds:   . azithromycin  500 mg Oral Daily   Followed by  . azithromycin  250 mg Oral Daily  . cholecalciferol  3,000 Units Oral Daily  . docusate sodium  100 mg Oral BID  . lisinopril  10 mg Oral Daily  . magnesium oxide  400 mg Oral Once  . magnesium sulfate 1 - 4 g bolus IVPB  1 g Intravenous Once  . piperacillin-tazobactam (ZOSYN)  IV  3.375 g Intravenous Q8H  . vancomycin  1,250 mg Intravenous Q12H   Continuous Infusions:   . sodium chloride 100 mL/hr at 07/12/11 1033  . DISCONTD: sodium chloride 20 mL/hr at 07/12/11 0643   PRN Meds:.acetaminophen, acetaminophen, alum & mag hydroxide-simeth, bisacodyl, LORazepam, ondansetron (ZOFRAN) IV, ondansetron, prochlorperazine, sodium chloride  Assessment/Plan:  Principal Problem:  *Sepsis Active Problems:  Hyponatremia  Anemia  Thrombocytopenia 1. Sepsis. Resolving.  Source remains unclear, has no infiltrates on chest x-ray. blood cultures per Port-A-Cath with no growth to date. Site looks clean urinalysis is negative. Urine culture with staph. CT chest somewhat concerning for an atypical pna. Will continue broad-spectrum c antibiotic coverage. Vanc/Zosyn day #4. Pt continues to feel better. o2 sats stable at 99 on room air. Remains  tachycardic 103-110. CT chest neg PE  2 anemia. This is most likely anemia of chronic disease and secondary to chemotherapy. S/P 2 units PRBC. Tachycardia stable.  3. Hyponatremia. Likely related to dehydration and possibly chronic related to chemo. Urine osmolality 215 serum osmolality 268.  Will give gently IV hydration. Recheck bmet in am.  Monitor closely  4. Thrombocytopenia. This is most likely due to chemotherapy. Hold chemical anticoagulation will monitor . Stable in 50's range. No s/sx bleeding.  5. Hypomagnesemia: repleted yesterday. This am 1.4. Will replete and recheck.   5. Recurrent fallopian tumor. Will notify Dr. Renelda Loma office of pt admission. See #1. Appreciate Dr. Renelda Loma input.  Patient will follow with hematology oncology as an outpatient upon discharge.  6 HTN. SBP range 106-134.. Will continue to hold norvasc and HCTZ. Continue lisinopril. Monitor closely.  7. Mild hypokalemia. Likely related to #1. Resolved. Monitor.  8. Dispo: lives at home. Likely discharge in am if sodium level improved.      LOS: 4 days   [facility] M 07/12/2011, 10:58 AM

## 2011-07-13 DIAGNOSIS — R112 Nausea with vomiting, unspecified: Secondary | ICD-10-CM

## 2011-07-13 DIAGNOSIS — E871 Hypo-osmolality and hyponatremia: Secondary | ICD-10-CM

## 2011-07-13 DIAGNOSIS — A413 Sepsis due to Hemophilus influenzae: Secondary | ICD-10-CM

## 2011-07-13 LAB — CBC
Hemoglobin: 8.1 g/dL — ABNORMAL LOW (ref 12.0–15.0)
Platelets: 64 10*3/uL — ABNORMAL LOW (ref 150–400)
RBC: 2.4 MIL/uL — ABNORMAL LOW (ref 3.87–5.11)
WBC: 10.6 10*3/uL — ABNORMAL HIGH (ref 4.0–10.5)

## 2011-07-13 LAB — BASIC METABOLIC PANEL
BUN: 7 mg/dL (ref 6–23)
CO2: 21 mEq/L (ref 19–32)
Chloride: 98 mEq/L (ref 96–112)
Chloride: 98 mEq/L (ref 96–112)
GFR calc Af Amer: 79 mL/min — ABNORMAL LOW (ref >90)
GFR calc Af Amer: 87 mL/min — ABNORMAL LOW (ref >90)
Potassium: 3.7 mEq/L (ref 3.5–5.1)
Potassium: 3.6 mEq/L (ref 3.5–5.1)
Sodium: 129 mEq/L — ABNORMAL LOW (ref 135–145)
Sodium: 133 mEq/L — ABNORMAL LOW (ref 135–145)

## 2011-07-13 LAB — MAGNESIUM
Magnesium: 1 mg/dL — ABNORMAL LOW (ref 1.5–2.5)
Magnesium: 1.5 mg/dL (ref 1.5–2.5)

## 2011-07-13 MED ORDER — SODIUM CHLORIDE 0.9 % IV SOLN: INTRAVENOUS | Status: AC

## 2011-07-13 MED ORDER — MAGNESIUM SULFATE 40 MG/ML IJ SOLN
2.0000 g | Freq: Once | INTRAMUSCULAR | Status: AC
  Administered 2011-07-13: 2 g via INTRAVENOUS
  Filled 2011-07-13: qty 50

## 2011-07-13 MED ORDER — HEPARIN SOD (PORK) LOCK FLUSH 100 UNIT/ML IV SOLN
500.0000 [IU] | Freq: Once | INTRAVENOUS | Status: AC
  Administered 2011-07-13: 500 [IU] via INTRAVENOUS
  Filled 2011-07-13: qty 5

## 2011-07-13 NOTE — Discharge Instructions (Signed)
Follow with Primary MD Carollee Herter, MD, MD in 3 days , also a lung doctor in 1 week, you will need to follow your CT results with the Lung Doctor in 1 week.  Get CBC, CMP,Magnesium  checked 3 days by Primary MD and again as instructed by your Primary MD. Get a 2 view Chest X ray done next visit if you had Pneumonia of Lung problems at the Hospital.  Get Medicines reviewed and adjusted.  Please request your Prim.MD to go over all Hospital Tests and Procedure/Radiological results at the follow up, please get all Hospital records sent to your Prim MD by signing hospital release before you go home.  Activity: As tolerated with Full fall precautions use walker/cane & assistance as needed  Diet: Heart Healthy, Aspiration precautions.  For Heart failure patients - Check your Weight same time everyday, if you gain over 2 pounds, or you develop in leg swelling, experience more shortness of breath or chest pain, call your Primary MD immediately. Follow Cardiac Low Salt Diet and 1.8 lit/day fluid restriction.  Disposition Home  If you experience worsening of your admission symptoms, develop shortness of breath, life threatening emergency, suicidal or homicidal thoughts you must seek medical attention immediately by calling 911 or calling your MD immediately  if symptoms less severe.  You Must read complete instructions/literature along with all the possible adverse reactions/side effects for all the Medicines you take and that have been prescribed to you. Take any new Medicines after you have completely understood and accpet all the possible adverse reactions/side effects.   Do not drive if your were admitted for syncope or siezures until you have seen by Primary MD or a Neurologist and advised to drive.  Do not drive when taking Pain medications.    Do not take more than prescribed Pain, Sleep and Anxiety Medications  Special Instructions: If you have smoked or chewed Tobacco  in the last 2  yrs please stop smoking, stop any regular Alcohol  and or any Recreational drug use.  Wear Seat belts while driving.

## 2011-07-13 NOTE — Discharge Summary (Signed)
[facility]                                                                                   Abigail [name], is a 56 y.o. female  DOB January 02, 1956  MRN [id number].  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 Hypertension   . Obesity   . Diverticulitis   . Hemorrhoids   . Colonic polyp   . GERD (gastroesophageal reflux disease)   . Cancer     BREAST, FALLOPIAN TUBE,    Past Surgical History  Procedure Date  . Tonsillectomy and adenoidectomy   . Breast lumpectomy   . Abdominal hysterectomy     TAH, BSO  . Port a cath     05/29/11     Hospital Course See H&P, Labs, Consult and Test reports for all details in brief -  Brief H and P:  For complete details please refer to admission H and P, but in brief   Abigail [name] is a 56 y.o. 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 Patient reported she  feeling febrile for one day,  progressive weakness, fatigue and shortness of breath, patient had temperature 100.5 at home, and was found to have leukocytosis of 14,000 in CBC, chest x-ray did not show any acute infiltrate, cultures were sent, urine analysis sent. Patient was found to have hemoglobin of 7.7, total was found to have thrombocytopenia of 84,000, and sodium of 12. Hospitalist service  requested  to admit for management of sepsis. Patient  recently discharged from [facility] for neutropenic fever, patient reported shortness of breath denied any leg swelling recent travel chest pain hemoptysis. Reported mild cough nonproductive denied any dysuria or polyuria worsening abdominal pain.       Hospital Course:     1. Fever due to to most likely due to atypical pneumonia - in a patient with recurrent fallopian tube cancer who is undergoing chemotherapy has a right sided Port-A-Cath and is on Neulasta to her oncologist. Patient had blood cultures which have remained negative x4 days, she had a CT scan of chest suggestive of either interstitial lung disease or atypical pneumonia, she was treated on empiric antibiotics with good results, made afebrile for the last 3 days she was seen by oncologist Dr. Donnie Coffin who was of the opinion that her mild temperature was due to Neupogen caused leukocytosis i.e. bone marrow activity. However patient did have mild cough and post antibiotics has done reasonably well, she does not have any shortness of breath her oxygen need, currently 100% on  room air, discharge her on Levaquin for 7 more days, outpatient pulmonary followup for multiple CT chest findings as dictated below in the report. Patient will need repeat CT scan preferably high-resolution in 6 months and must follow with pulmonology in one to 2 weeks.     2 Anemia. This is most likely anemia of chronic disease and secondary to chemotherapy. S/P 2 units PRBC. At discharge Hg stable, no active signs of bleeding, will continue to follow with her oncologist as outpatient.    3.  Tachycardia Pt had CT angio with no evidence PE. Likely related to fever. HR stable in 100-110 range.  Continue outpatient monitoring patient is symptom free and does not feel any palpitations etc. if tachycardia persists TSH can be checked.    4. Hyponatremia. Likely related to dehydration, and electrolytes suggestive of  dehydration 2, hyponatremia much improved with IV fluids. Repeat BMP in 3-4 days.     4. Thrombocytopenia. This is most likely due to chemotherapy. Hold chemical anticoagulation will monitor . During hospitalization stable in 50's range. At discharge platelets 64.  No s/sx bleeding. Continue outpatient oncology followup with Dr. Darnelle Catalan.    5. Hypomagnesemia:  Is stable after repletion, please check magnesium along with BMP in 3-4 days.   5. Recurrent fallopian tumor. Pt seen by Dr. Donnie Coffin in the hospital.  Patient will follow with hematology oncology Dr.  Darnelle Catalan as an outpatient upon discharge.    6 HTN. Stable now, we'll resume  lisinopril HCTZ combo will hold Norvasc, but her blood pressure and BMP adjust blood pressure medications as indicated. If patient shows signs of dehydration again discontinue HCTZ.     Consults  Oncology  Significant Tests:  See full reports for all details     Dg Chest 2 View  07/08/2011  *RADIOLOGY REPORT*  Clinical Data: Shortness of breath.  CHEST - 2 VIEW  Comparison: 06/12/2011.  Findings: The power port is stable.  The heart is enlarged but unchanged.  The low lung volumes with vascular crowding and bibasilar atelectasis.  No infiltrates, edema or effusions.  The bony thorax is intact.  Stable degenerative changes involving the thoracic spine.  IMPRESSION:  1.  Cardiac enlargement, unchanged. 2.  Low lung volumes with vascular crowding and bibasilar atelectasis.  Original Report Authenticated By: P. Loralie Champagne, M.D.   Ct Angio Chest W/cm &/or Wo Cm  07/10/2011  *RADIOLOGY REPORT*  Clinical Data: Shortness of breath.  Evaluate for pulmonary embolism.  History of breast cancer.  CT ANGIOGRAPHY CHEST  Technique:  Multidetector CT imaging of the chest using the standard protocol during bolus administration of intravenous contrast. Multiplanar reconstructed images including MIPs were obtained and reviewed to evaluate the vascular anatomy.  Contrast: 80mL  OMNIPAQUE IOHEXOL 300 MG/ML  SOLN  Comparison: CT of chest 10/24/2010.  Findings:  Mediastinum: There are no filling defects within the pulmonary arterial tree to suggest underlying pulmonary embolism. Heart size is borderline enlarged. Trace amount of pericardial fluid and / or thickening, unlikely to be of any hemodynamic significance at this time.  No associated pericardial calcification.  There are numerous calcified mediastinal and bilateral hilar lymph nodes.  No definite pathologically enlarged noncalcified mediastinal or hilar lymph nodes are appreciated.  There is a new right internal jugular single lumen Port-A-Cath with tip terminating in the right atrium. Esophagus is unremarkable in appearance.  Borderline enlarged (10 mm short axis) retrocrural lymph nodes are incidentally noted, of uncertain etiology and significance.  Lungs/Pleura: There are some  patchy regions of peripheral subpleural reticulation, most pronounced in the lung bases. Additionally, in the inferior aspect of the lower lobes of the lungs bilaterally there is more profound reticulation with some associated traction bronchiectasis and peripheral bronchiolectasis. No acute airspace consolidation.  No pleural effusions.  The apices of the lungs bilaterally there is extensive peripheral micronodularity and pleural thickening, likely reflect post infectious scarring.  The largest single nodule measures approximately 6 mm in the right apex (image 22 of series 7).  No other larger more suspicious appearing pulmonary nodules or masses are otherwise identified.  Upper Abdomen: Unremarkable.  Musculoskeletal: There are no aggressive appearing lytic or blastic lesions noted in the visualized portions of the skeleton.  IMPRESSION: 1.  No evidence of pulmonary embolism. 2.  No acute findings in the visualized thorax to account for the patient's symptoms. 3.  However, the appearance of the lungs is suggestive of an underlying interstitial lung  disease.  The pattern is not definitive at this time, but this may represent an early manifestation of usual interstitial pneumonia (UIP).  Attention on follow-up high-resolution CT scans would be of use to monitor for progression of disease. 4.  Sequelae of old granulomatous disease with extensive bilateral hilar and mediastinal calcified lymphadenopathy. 5.  Bilateral apical peripheral micronodularity, with the largest single nodule measuring approximately 6 mm in the right upper lobe. These findings are nonspecific, and favored to reflect post infectious scarring.  This pattern is not typical for metastatic disease. If the patient is at high risk for bronchogenic carcinoma, follow-up chest CT at 6-12 months is recommended.  If the patient is at low risk for bronchogenic carcinoma, follow-up chest CT at 12 months is recommended.  This recommendation follows the consensus statement: Guidelines for Management of Small Pulmonary Nodules Detected on CT Scans: A Statement from the Fleischner Society as published in Radiology 2005; 237:395-400.  This follow-up CT scan should be performed as a high resolution chest CT to moderate for progression of interstitial lung disease.  Original Report Authenticated By: Florencia Reasons, M.D.     Today   Subjective:   Abigail [name] today has no headache,no chest abdominal pain,no new weakness tingling or numbness, feels much better wants to go home today.    Objective:   Blood pressure 123/80, pulse 111, temperature 98.2 F (36.8 C), temperature source Oral, resp. rate 22, height 5\' 1"  (1.549 m), weight 96.8 kg (213 lb 6.5 oz), SpO2 100.00%.  Intake/Output Summary (Last 24 hours) at 07/13/11 1439 Last data filed at 07/13/11 1300  Gross per 24 hour  Intake   2335 ml  Output   1200 ml  Net   1135 ml    Exam Awake Alert, Oriented *3, No new F.N deficits, Normal affect [place].AT,PERRAL Supple Neck,No JVD, No cervical lymphadenopathy appriciated.  Symmetrical  Chest wall movement, Good air movement bilaterally, CTAB RRR,No Gallops,Rubs or new Murmurs, No Parasternal Heave +ve B.Sounds, Abd Soft, Non tender, No organomegaly appriciated, No rebound -guarding or rigidity. No Cyanosis, Clubbing or edema, No new Rash or bruise  Data Review     Recent Results (from the past 240 hour(s))  CULTURE, BLOOD (ROUTINE X 2)     Status: Normal (Preliminary result)   Collection Time   07/08/11 10:33 PM      Component Value Range Status Comment   Specimen Description BLOOD PORTA CATH   Final    Special Requests BOTTLES DRAWN AEROBIC AND ANAEROBIC 5CC   Final    Culture  Setup Time [id number]   Final    Culture     Final    Value:        BLOOD CULTURE RECEIVED NO GROWTH TO DATE CULTURE WILL BE HELD FOR 5 DAYS BEFORE ISSUING A FINAL NEGATIVE REPORT   Report Status PENDING   Incomplete   CULTURE, BLOOD (ROUTINE X 2)     Status: Normal (Preliminary result)   Collection Time   07/08/11 10:40 PM      Component Value Range Status Comment   Specimen Description BLOOD RIGHT ARM   Final    Special Requests BOTTLES DRAWN AEROBIC AND ANAEROBIC 4CC   Final    Culture  Setup Time [id number]   Final    Culture     Final    Value:        BLOOD CULTURE RECEIVED NO GROWTH TO DATE CULTURE WILL BE HELD FOR 5 DAYS BEFORE ISSUING A FINAL NEGATIVE REPORT   Report Status PENDING   Incomplete   URINE CULTURE     Status: Normal   Collection Time   07/09/11 12:15 AM      Component Value Range Status Comment   Specimen Description URINE, CLEAN CATCH   Final    Special Requests NONE   Final    Culture  Setup Time [id number]   Final    Colony Count 10,000 COLONIES/ML   Final    Culture     Final    Value: STAPHYLOCOCCUS SPECIES (COAGULASE NEGATIVE)     Note: RIFAMPIN AND GENTAMICIN SHOULD NOT BE USED AS SINGLE DRUGS FOR TREATMENT OF STAPH INFECTIONS.   Report Status 07/12/2011 FINAL   Final    Organism ID, Bacteria STAPHYLOCOCCUS SPECIES (COAGULASE NEGATIVE)   Final       CBC w Diff: Lab Results  Component Value Date   WBC 10.6* 07/13/2011   WBC 1.6* 07/03/2011   HGB 8.1* 07/13/2011   HGB 8.9* 07/03/2011   HCT 23.6* 07/13/2011   HCT 26.2* 07/03/2011   PLT 64* 07/13/2011   PLT 265 07/03/2011   LYMPHOPCT 11* 07/08/2011   LYMPHOPCT 47.8 07/03/2011   BANDSPCT 0 10/09/2007   MONOPCT 11 07/08/2011   MONOPCT 42.1* 07/03/2011   EOSPCT 0 07/08/2011   EOSPCT 1.3 07/03/2011   BASOPCT 0 07/08/2011   BASOPCT 0.6 07/03/2011    CMP: Lab Results  Component Value Date   NA 133* 07/13/2011   K 3.6 07/13/2011   CL 98 07/13/2011   CO2 21 07/13/2011   BUN 6 07/13/2011   CREATININE 0.84 07/13/2011   PROT 6.5 07/09/2011   ALBUMIN 3.0* 07/09/2011   BILITOT 0.3 07/09/2011   ALKPHOS 115 07/09/2011   AST 14 07/09/2011   ALT 12 07/09/2011  .   Discharge Instructions     Follow with Primary MD Carollee Herter, MD, MD in 3 days , also a lung doctor in 1 week, you will need to follow your CT results with the Lung Doctor in 1 week.  Get CBC, CMP, Magnesium   checked 3 days by Primary MD and again as instructed by your Primary MD. Get a 2 view Chest X ray done next visit if you had Pneumonia of Lung problems at the Hospital.  Get Medicines reviewed and adjusted.  Please request your Prim.MD to go over all Hospital Tests and Procedure/Radiological results at the follow up, please get all Hospital records sent to your Prim MD by signing hospital release before you go home.  Activity: As tolerated  with Full fall precautions use walker/cane & assistance as needed  Diet: Heart Healthy, Aspiration precautions.  For Heart failure patients - Check your Weight same time everyday, if you gain over 2 pounds, or you develop in leg swelling, experience more shortness of breath or chest pain, call your Primary MD immediately. Follow Cardiac Low Salt Diet and 1.8 lit/day fluid restriction.  Disposition Home  If you experience worsening of your admission symptoms, develop shortness of breath, life threatening  emergency, suicidal or homicidal thoughts you must seek medical attention immediately by calling 911 or calling your MD immediately  if symptoms less severe.  You Must read complete instructions/literature along with all the possible adverse reactions/side effects for all the Medicines you take and that have been prescribed to you. Take any new Medicines after you have completely understood and accpet all the possible adverse reactions/side effects.   Do not drive if your were admitted for syncope or siezures until you have seen by Primary MD or a Neurologist and advised to drive.  Do not drive when taking Pain medications.    Do not take more than prescribed Pain, Sleep and Anxiety Medications  Special Instructions: If you have smoked or chewed Tobacco  in the last 2 yrs please stop smoking, stop any regular Alcohol  and or any Recreational drug use.  Wear Seat belts while driving.    Follow-up Information    Follow up with Carollee Herter, MD. Schedule an appointment as soon as possible for a visit in 3 days.   Contact information:   [address] [place] [address] [phone]       Follow up with [facility], MD. Schedule an appointment as soon as possible for a visit in 1 week.   Contact information:   [address] [place] [address] [phone]       Follow up with Lowella Dell, MD. Schedule an appointment as soon as possible for a visit in 5 days.   Contact information:   [address] [place] [address] [phone]          Discharge Medications    Abigail [name], Abigail [name]  Home Medication Instructions [id number]   Printed on:07/13/11 1439  Medication Information                    Cholecalciferol (VITAMIN D3) 3000 UNITS TABS Take 3,000 Units by mouth daily.            ondansetron (ZOFRAN) 8 MG tablet Take 1 tab two times a day starting the day after chemo for 3 days. Then take 1 tab  two times a day as needed for nausea or vomiting.           prochlorperazine (COMPAZINE) 10 MG tablet Take 1 tablet (10 mg total) by mouth every 6 (six) hours as needed (Nausea or vomiting).           LORazepam (ATIVAN) 0.5 MG tablet Take 1 tablet (0.5 mg total) by mouth every 6 (six) hours as needed (Nausea or vomiting).           lisinopril-hydrochlorothiazide (PRINZIDE,ZESTORETIC) 10-12.5 MG per tablet Take 1 tablet by mouth daily with breakfast.           lidocaine-prilocaine (EMLA) cream Apply topically as needed. For PAC           levofloxacin (LEVAQUIN) 500 MG tablet Take 1 tablet (500 mg total) by mouth daily.  Total Time in preparing paper work, data evaluation and todays exam - 35 minutes  Leroy Sea M.D on 07/13/2011 at 2:39 PM  [facility] Office  [phone]

## 2011-07-13 NOTE — Progress Notes (Signed)
ANTIBIOTIC CONSULT NOTE - Follow Up  Pharmacy Consult for vancomycin Indication: rule out sepsis/PNA  Allergies  Allergen Reactions  . Codeine Itching  . Oxycodone Nausea Only  . Vicodin (Hydrocodone-Acetaminophen) Nausea Only    Patient Measurements: Height: 5\' 1"  (154.9 cm) Weight: 213 lb 6.5 oz (96.8 kg) (standup scale) IBW/kg (Calculated) : 47.8  Adjusted Body Weight:   Vital Signs: Temp: 99.1 F (37.3 C) (06/07 0523) Temp src: Oral (06/07 0523) BP: 115/78 mmHg (06/07 0523) Pulse Rate: 98  (06/07 0523) Intake/Output from previous day: 06/06 0701 - 06/07 0700 In: 2827 [P.O.:1502; I.V.:825; IV Piggyback:500] Out: 1250 [Urine:1250] Intake/Output from this shift:    Labs:  Basename 07/13/11 0455 07/12/11 0400 07/11/11 0400  WBC -- 11.5* 11.5*  HGB -- 8.1* 8.3*  PLT -- 54* 54*  LABCREA -- -- --  CREATININE 0.91 0.85 0.82   Estimated Creatinine Clearance: 71.7 ml/min (by C-G formula based on Cr of 0.91).  Basename 07/10/11 1450  VANCOTROUGH 12.7  VANCOPEAK --  VANCORANDOM --  GENTTROUGH --  GENTPEAK --  GENTRANDOM --  TOBRATROUGH --  TOBRAPEAK --  TOBRARND --  AMIKACINPEAK --  AMIKACINTROU --  AMIKACIN --     Microbiology: Recent Results (from the past 720 hour(s))  CULTURE, BLOOD (ROUTINE X 2)     Status: Normal (Preliminary result)   Collection Time   07/08/11 10:33 PM      Component Value Range Status Comment   Specimen Description BLOOD PORTA CATH   Final    Special Requests BOTTLES DRAWN AEROBIC AND ANAEROBIC 5CC   Final    Culture  Setup Time [id number]   Final    Culture     Final    Value:        BLOOD CULTURE RECEIVED NO GROWTH TO DATE CULTURE WILL BE HELD FOR 5 DAYS BEFORE ISSUING A FINAL NEGATIVE REPORT   Report Status PENDING   Incomplete   CULTURE, BLOOD (ROUTINE X 2)     Status: Normal (Preliminary result)   Collection Time   07/08/11 10:40 PM      Component Value Range Status Comment   Specimen Description BLOOD RIGHT ARM   Final    Special Requests BOTTLES DRAWN AEROBIC AND ANAEROBIC 4CC   Final    Culture  Setup Time [id number]   Final    Culture     Final    Value:        BLOOD CULTURE RECEIVED NO GROWTH TO DATE CULTURE WILL BE HELD FOR 5 DAYS BEFORE ISSUING A FINAL NEGATIVE REPORT   Report Status PENDING   Incomplete   URINE CULTURE     Status: Normal   Collection Time   07/09/11 12:15 AM      Component Value Range Status Comment   Specimen Description URINE, CLEAN CATCH   Final    Special Requests NONE   Final    Culture  Setup Time [id number]   Final    Colony Count 10,000 COLONIES/ML   Final    Culture     Final    Value: STAPHYLOCOCCUS SPECIES (COAGULASE NEGATIVE)     Note: RIFAMPIN AND GENTAMICIN SHOULD NOT BE USED AS SINGLE DRUGS FOR TREATMENT OF STAPH INFECTIONS.   Report Status 07/12/2011 FINAL   Final    Organism ID, Bacteria STAPHYLOCOCCUS SPECIES (COAGULASE NEGATIVE)   Final     Medical History: Past Medical History  Diagnosis Date  . Hypertension   . Obesity   . Diverticulitis   .  Hemorrhoids   . Colonic polyp   . GERD (gastroesophageal reflux disease)   . Cancer     BREAST, FALLOPIAN TUBE,    Medications:  Anti-infectives     Start     Dose/Rate Route Frequency Ordered Stop   07/12/11 1000   azithromycin (ZITHROMAX) tablet 250 mg        250 mg Oral Daily 07/11/11 1245 07/16/11 0959   07/11/11 1400   azithromycin (ZITHROMAX) tablet 500 mg        500 mg Oral Daily 07/11/11 1245 07/11/11 1448   07/10/11 1800   vancomycin (VANCOCIN) 1,250 mg in sodium chloride 0.9 % 250 mL IVPB        1,250 mg 166.7 mL/hr over 90 Minutes Intravenous Every 12 hours 07/10/11 1720     07/10/11 1600   vancomycin (VANCOCIN) IVPB 1000 mg/200 mL premix  Status:  Discontinued        1,000 mg 200 mL/hr over 60 Minutes Intravenous Every 12 hours 07/10/11 0433 07/10/11 1718   07/09/11 1200   piperacillin-tazobactam (ZOSYN) IVPB 3.375 g        3.375 g 12.5 mL/hr over 240 Minutes Intravenous Every 8 hours  07/09/11 1003     07/09/11 1000   vancomycin (VANCOCIN) IVPB 1000 mg/200 mL premix  Status:  Discontinued        1,000 mg 200 mL/hr over 60 Minutes Intravenous Every 12 hours 07/09/11 0447 07/10/11 0433   07/09/11 0800   piperacillin-tazobactam (ZOSYN) IVPB 3.375 g  Status:  Discontinued        3.375 g 12.5 mL/hr over 240 Minutes Intravenous Every 8 hours 07/09/11 0447 07/09/11 1003   07/09/11 0600   piperacillin-tazobactam (ZOSYN) IVPB 3.375 g  Status:  Discontinued        3.375 g 12.5 mL/hr over 240 Minutes Intravenous 3 times per day 07/09/11 0047 07/09/11 0447   07/09/11 0100   piperacillin-tazobactam (ZOSYN) IVPB 3.375 g        3.375 g 100 mL/hr over 30 Minutes Intravenous  Once 07/09/11 0044 07/09/11 0335   07/09/11 0045   vancomycin (VANCOCIN) IVPB 1000 mg/200 mL premix        1,000 mg 200 mL/hr over 60 Minutes Intravenous  Once 07/09/11 0044 07/09/11 0330         Assessment:  Day #5 Vancomycin and Zosyn for ?sepsis at admission with unknown source of infection  Day #3/5 Zithromax for possible atypical PNA  Afebrile  Stable renal function  WBC not checked today - just above WNL range the last few days   Goal of Therapy:  Vancomycin trough level 15-20 mcg/ml Zosyn based on renal function   Plan:   Continue Vancomycin 1250mg  IV q12 for now  Continue Zosyn 3.375g IV q8 (extended interval infusion) for now  Noted plan to likely discharge patient today   Hessie Knows, PharmD, BCPS Pager [phone] 07/13/2011 9:11 AM

## 2011-07-13 NOTE — Progress Notes (Signed)
Physical Therapy Treatment Patient Details Name: Abigail [name] MRN: [id number] DOB: 11/04/1955 Today's Date: 07/13/2011 Time: 4098-1191 PT Time Calculation (min): 16 min  PT Assessment / Plan / Recommendation Comments on Treatment Session  Patient limited by dyspnea and unsteadiness during mobility/gait.  Encouraged patient to ambulate in hallway with nursing in addition to therapy session.    Follow Up Recommendations  Home health PT    Barriers to Discharge        Equipment Recommendations  None recommended by PT    Recommendations for Other Services    Frequency Min 3X/week   Plan Discharge plan remains appropriate;Frequency remains appropriate    Precautions / Restrictions Restrictions Weight Bearing Restrictions: No       Mobility  Bed Mobility Bed Mobility: Supine to Sit;Sitting - Scoot to Edge of Bed Supine to Sit: 7: Independent;HOB flat Sitting - Scoot to Edge of Bed: 7: Independent Transfers Transfers: Sit to Stand;Stand to Sit Sit to Stand: 5: Supervision;From bed;With upper extremity assist Stand to Sit: 5: Supervision;With upper extremity assist;To chair/3-in-1;With armrests Details for Transfer Assistance: Verbal cues to move slowly through transitions to prevent dizziness/unsteadiness. Ambulation/Gait Ambulation/Gait Assistance: 4: Min guard Ambulation Distance (Feet): 130 Feet Assistive device: None Gait Pattern: Step-through pattern;Decreased stride length;Lateral trunk lean to right;Lateral trunk lean to left;Wide base of support Gait velocity: decreased General Gait Details: Patient somewhat unsteady x3 during gait.  Able to self-correct.  Required one standing rest break due to dyspnea 3/4     PT Goals Acute Rehab PT Goals PT Goal: Sit to Stand - Progress: Progressing toward goal PT Goal: Stand to Sit - Progress: Progressing toward goal PT Goal: Ambulate - Progress: Progressing toward goal  Visit Information  Last PT Received On:  07/13/11 Assistance Needed: +1    Subjective Data  Subjective: Patient complained of dyspnea during gait - 4/5.   Cognition  Overall Cognitive Status: Appears within functional limits for tasks assessed/performed Arousal/Alertness: Awake/alert Orientation Level: Oriented X4 / Intact Behavior During Session: [facility] for tasks performed    Balance     End of Session PT - End of Session Activity Tolerance: Patient limited by fatigue Patient left: in chair;with call bell/phone within reach Nurse Communication: Mobility status (Encouraged patient to ambulate 2-3x/day with nursing assist.)    Vena Austria 07/13/2011, 10:02 AM Durenda Hurt. Renaldo Fiddler, [facility] Acute Rehab Services Pager [phone]

## 2011-07-13 NOTE — Progress Notes (Signed)
SATURATION QUALIFICATIONS:  Patient Saturations on Room Air at Rest = 98%  Patient Saturations on Room Air while Ambulating = 95%  Patient Saturations on zero Liters of oxygen while Ambulating = 95%

## 2011-07-15 LAB — CULTURE, BLOOD (ROUTINE X 2)
Culture  Setup Time: 201306031003
Culture: NO GROWTH

## 2011-07-16 ENCOUNTER — Telehealth: Payer: Self-pay | Admitting: Family Medicine

## 2011-07-16 ENCOUNTER — Telehealth (HOSPITAL_COMMUNITY): Payer: Self-pay | Admitting: Internal Medicine

## 2011-07-16 NOTE — Telephone Encounter (Signed)
I discussed her care with Abigail [name] we will continue to monitor her situation and she will be given an OT consult

## 2011-07-16 NOTE — Telephone Encounter (Signed)
Abigail [name] called to report temp of 100.2.  She denied any other symptoms.  Due for treatment tomorrow reports been awhile since last treatment.  Advised that she should monitor her temperature closely and if it rises above 100.4 she will likely need to go to ED or if she has clinical worsening of her symptoms.

## 2011-07-16 NOTE — Telephone Encounter (Signed)
Abigail [name] with [facility] called and had concerns of pt heart rate at resting it is 118 - 120 and then when she is up moving slowly it goes to 130 range and does get short of breath and she has a small temp of 99.5 oral.  She just came home from hospital on Friday and has a follow up appt with you on Thursday.  Pt is on antibiotic Levofloxacin 500 mg qd for 7 days and was started on 07/14/11.  Pt has been admitted to [facility] and is requesting occupational therapist for energy conservation and nursing will visit twice weekly.  Arline Asp can be reached at [phone].

## 2011-07-17 ENCOUNTER — Telehealth: Payer: Self-pay | Admitting: *Deleted

## 2011-07-17 ENCOUNTER — Ambulatory Visit: Payer: Medicare Other

## 2011-07-17 ENCOUNTER — Other Ambulatory Visit: Payer: Self-pay | Admitting: Oncology

## 2011-07-17 ENCOUNTER — Other Ambulatory Visit: Payer: Self-pay | Admitting: *Deleted

## 2011-07-17 ENCOUNTER — Other Ambulatory Visit (HOSPITAL_BASED_OUTPATIENT_CLINIC_OR_DEPARTMENT_OTHER): Payer: Medicare Other | Admitting: Oncology

## 2011-07-17 ENCOUNTER — Ambulatory Visit (HOSPITAL_BASED_OUTPATIENT_CLINIC_OR_DEPARTMENT_OTHER): Payer: Medicare Other

## 2011-07-17 ENCOUNTER — Ambulatory Visit (HOSPITAL_COMMUNITY)
Admission: RE | Admit: 2011-07-17 | Discharge: 2011-07-17 | Disposition: A | Discharge status: Home / Self Care | Payer: Medicare Other | Source: Ambulatory Visit | Attending: Oncology | Admitting: Oncology

## 2011-07-17 DIAGNOSIS — C5700 Malignant neoplasm of unspecified fallopian tube: Secondary | ICD-10-CM

## 2011-07-17 DIAGNOSIS — M7989 Other specified soft tissue disorders: Secondary | ICD-10-CM

## 2011-07-17 DIAGNOSIS — Z1501 Genetic susceptibility to malignant neoplasm of breast: Secondary | ICD-10-CM

## 2011-07-17 DIAGNOSIS — D709 Neutropenia, unspecified: Secondary | ICD-10-CM

## 2011-07-17 DIAGNOSIS — D696 Thrombocytopenia, unspecified: Secondary | ICD-10-CM | POA: Insufficient documentation

## 2011-07-17 DIAGNOSIS — O2230 Deep phlebothrombosis in pregnancy, unspecified trimester: Secondary | ICD-10-CM

## 2011-07-17 DIAGNOSIS — I82C19 Acute embolism and thrombosis of unspecified internal jugular vein: Secondary | ICD-10-CM

## 2011-07-17 LAB — CBC WITH DIFFERENTIAL/PLATELET
Eosinophils Absolute: 0.1 10*3/uL (ref 0.0–0.5)
HCT: 24.2 % — ABNORMAL LOW (ref 34.8–46.6)
LYMPH%: 11.5 % — ABNORMAL LOW (ref 14.0–49.7)
MONO#: 1.2 10*3/uL — ABNORMAL HIGH (ref 0.1–0.9)
NEUT#: 7.4 10*3/uL — ABNORMAL HIGH (ref 1.5–6.5)
NEUT%: 75.1 % (ref 38.4–76.8)
Platelets: 105 10*3/uL — ABNORMAL LOW (ref 145–400)
RBC: 2.52 10*6/uL — ABNORMAL LOW (ref 3.70–5.45)
WBC: 9.9 10*3/uL (ref 3.9–10.3)
lymph#: 1.1 10*3/uL (ref 0.9–3.3)
nRBC: 0 % (ref 0–0)

## 2011-07-17 MED ORDER — FONDAPARINUX SODIUM 7.5 MG/0.6ML ~~LOC~~ SOLN
7.5000 mg | SUBCUTANEOUS | Status: DC
  Administered 2011-07-17: 7.5 mg via SUBCUTANEOUS
  Filled 2011-07-17: qty 0.6

## 2011-07-17 NOTE — Telephone Encounter (Signed)
gve the pt her inj appts for wed-sat

## 2011-07-17 NOTE — Progress Notes (Signed)
Hematology and Oncology Follow Up Visit  Abigail [name] [id number] 01-29-1956 56 y.o. 07/17/2011 10:47 AM PCP  Principle Diagnosis: 56 yo with hx of BRCA abnormality and previous history of breast and ovarian  Cancer, treated ~ 2 y ago, now with recurrent ovarian cancer s/p 2 cycle of Abigail [name] /taxol  Due for day 1 C3 today. Abigail [name] was readmitted to hospital after the second cycle with more fevers. Etiology fevers was not clear. She had extensive cultures drawn. She's treated empirically. Her counts were not well at this point. She did have a transfusion because of a hemoglobin of 7. She was discharged with a temperature of 99 an additional week of Levaquin given. Her temperature today is 99 with a pulse rate of 133. She feels unwell. She also noted some swelling of her right neck. She has had some abdominal discomfort she has poor appetite. She also had a CT scan in the hospital which showed some minor bilateral lower lobe interstitial changes and has a visit to see the pulmonologist.   Medications:  No changes  Allergies:  Allergies  Allergen Reactions  . Codeine Itching  . Oxycodone Nausea Only  . Vicodin (Hydrocodone-Acetaminophen) Nausea Only    Past Medical History, Surgical history, Social history, and Family History were reviewed and updated.  Review of Systems: Constitutional: She is not having chills but is feels cold appear she denies dysuria she is a nonproductive cough. Cardiovascular: no chest pain or dyspnea on exertion Respiratory: no cough, shortness of breath, or wheezing Neurological: no TIA or stroke symptoms Dermatological: negative ENT: negative Skin Gastrointestinal: no abdominal pain, change in bowel habits, or black or bloody stools Genito-Urinary: negative Hematological and Lymphatic: negative Breast: negative Musculoskeletal: negative Remaining ROS negative.  Physical Exam: There were no vitals taken for this visit. ECOG: 1 General appearance: alert,  cooperative and appears stated age Head: Normocephalic, without obvious abnormality, atraumatic Neck: Right neck swelling some tenderness noted. Lymph nodes: Cervical, supraclavicular, and axillary nodes normal. Cardiac : regular rate and rhythm, no murmurs or gallops Pulmonary:clear to auscultation bilaterally and normal percussion bilaterally Breasts: inspection negative, no nipple discharge or bleeding, no masses or nodularity palpable Abdomen:soft, non-tender; bowel sounds normal; no masses,  no organomegaly Extremities 1+ lower from edema Neuro: alert, oriented, normal speech, no focal findings or movement disorder noted  Lab Results: Lab Results  Component Value Date   WBC 9.9 07/17/2011   HGB 8.3* 07/17/2011   HCT 24.2* 07/17/2011   MCV 96.0 07/17/2011   PLT 105* 07/17/2011     Chemistry      Component Value Date/Time   NA 133* 07/13/2011 1240   K 3.6 07/13/2011 1240   CL 98 07/13/2011 1240   CO2 21 07/13/2011 1240   BUN 6 07/13/2011 1240   CREATININE 0.84 07/13/2011 1240      Component Value Date/Time   CALCIUM 9.3 07/13/2011 1240   ALKPHOS 115 07/09/2011 0155   AST 14 07/09/2011 0155   ALT 12 07/09/2011 0155   BILITOT 0.3 07/09/2011 0155      .pathology. Radiological Studies: chest X-ray n/a Mammogram n/a Bone density n/a  Impression and Plan: Abigail [name]  unfortunate is having ongoing issues related to chemotherapy. She is now been admitted twice with fevers after chemotherapy. It's conceivable she is having a problem with the carboplatin portion of her chemotherapy which is causing her no some form of intolerance and/or allergic response with fever. I have discussed this with her. I think we should hold this for  now. Her counts are also quite low and i I think she is having an excessive myelosuppression from chemotherapy. The option is to treat with Doxil single agent. In the interim I will go ahead and get a Doppler ultrasound of the right neck to rule out a blood clot. We will continue  to hold on chemotherapy this week as she completes a course of Levaquin and sees pulmonology.  More than 50% of the visit was spent in patient-related counselling   Pierce Crane, MD 6/11/201310:47 AM

## 2011-07-17 NOTE — Progress Notes (Signed)
Right upper extremity venous duplex completed.  Preliminary report is positive for DVT in the right jugular vein.  Negative for DVT in the subclavian vein bilaterally.

## 2011-07-18 ENCOUNTER — Encounter: Payer: Self-pay | Admitting: Internal Medicine

## 2011-07-18 ENCOUNTER — Telehealth: Payer: Self-pay | Admitting: Family Medicine

## 2011-07-18 ENCOUNTER — Ambulatory Visit (HOSPITAL_BASED_OUTPATIENT_CLINIC_OR_DEPARTMENT_OTHER): Payer: Medicare Other

## 2011-07-18 ENCOUNTER — Ambulatory Visit (INDEPENDENT_AMBULATORY_CARE_PROVIDER_SITE_OTHER): Payer: Medicare Other | Admitting: Internal Medicine

## 2011-07-18 ENCOUNTER — Other Ambulatory Visit: Payer: Self-pay | Admitting: Oncology

## 2011-07-18 VITALS — BP 128/88 | HR 145 | Temp 99.1°F | Ht 61.0 in | Wt 210.2 lb

## 2011-07-18 VITALS — BP 120/80 | HR 112 | Temp 97.8°F

## 2011-07-18 DIAGNOSIS — J849 Interstitial pulmonary disease, unspecified: Secondary | ICD-10-CM

## 2011-07-18 DIAGNOSIS — O2230 Deep phlebothrombosis in pregnancy, unspecified trimester: Secondary | ICD-10-CM

## 2011-07-18 DIAGNOSIS — R05 Cough: Secondary | ICD-10-CM

## 2011-07-18 DIAGNOSIS — I82C19 Acute embolism and thrombosis of unspecified internal jugular vein: Secondary | ICD-10-CM

## 2011-07-18 DIAGNOSIS — R0989 Other specified symptoms and signs involving the circulatory and respiratory systems: Secondary | ICD-10-CM

## 2011-07-18 DIAGNOSIS — C5700 Malignant neoplasm of unspecified fallopian tube: Secondary | ICD-10-CM

## 2011-07-18 DIAGNOSIS — J8410 Pulmonary fibrosis, unspecified: Secondary | ICD-10-CM

## 2011-07-18 DIAGNOSIS — R059 Cough, unspecified: Secondary | ICD-10-CM

## 2011-07-18 DIAGNOSIS — Z1501 Genetic susceptibility to malignant neoplasm of breast: Secondary | ICD-10-CM

## 2011-07-18 DIAGNOSIS — R000 Tachycardia, unspecified: Secondary | ICD-10-CM

## 2011-07-18 DIAGNOSIS — I498 Other specified cardiac arrhythmias: Secondary | ICD-10-CM

## 2011-07-18 DIAGNOSIS — R0600 Dyspnea, unspecified: Secondary | ICD-10-CM

## 2011-07-18 MED ORDER — FONDAPARINUX SODIUM 7.5 MG/0.6ML ~~LOC~~ SOLN
7.5000 mg | SUBCUTANEOUS | Status: AC
  Administered 2011-07-18: 7.5 mg via SUBCUTANEOUS
  Filled 2011-07-18: qty 0.6

## 2011-07-18 NOTE — Progress Notes (Signed)
Subjective:    Patient ID: Abigail [name], female    DOB: 02/12/1955, 56 y.o.   MRN: [id number]  HPI  56 year female. PCP is Carollee Herter, MD and Onclogist is Dr Pierce Crane Body mass index is 39.72 kg/(m^2).  reports that she quit smoking about 4 years ago. Her smoking use included Cigarettes. She has a 3.5 pack-year smoking history. She has never used smokeless tobacco. 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 hx of BRCA abnormality and previous history of breast and ovarian Cancer, treated ~ 2 y ago, now with recurrent ovarian cancer - Rx with carboplatin and taxol - cycle #07 May 2011 and cycle #07 Jun 2011  IOV 07/18/11  She has had recurrent fallopian tube cancer and based on chart review and her hx (she is a poor historian). She was admitted 06/12/11 - 06/18/11 with febrile neturopenia following cycle 1 of chemo. REsolved with antibiotics and neupogen and discharged. Subseuqently underwent cycle 2 chemo and in aftermath of that, 2 weeks later,  admitted 07/08/11 through 07/13/11 with  Fever, progressive weakness, fatigue and shortness of breath, temperature 100.5 at home, and was found to have leukocytosis of 14,000 in CBC and plat count 84K. Chest x-ray did not show any acute infiltrate, and appears cultures negative. Issues this admission were a) fever - unclear cause v  atypical pna v neuopen and temp 83F at discharge with one week levaquin at dc; b) tachycardia - PE ruled out by CT angio and c) electrolyte and cbc issues; got transfused and d) CT chest showing non-specific ILD with request to see pulmonary as outpatient.   Post discharge noted by home care to have low grade feber 99.5 - 100.5 at home along with resting tachycardia. Then saw Dr Donnie Coffin 07/17/11 and noted temp 83F and HR 133 in office.  Carboplatin toxicity considered.  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    Today , 07/18/11 presents to pulmonary for clarificaiton of CT chest finidings but she has no idea about that. Instead her complaints are about dyspnea that is of insidious onset, chronic, stable, class 2-3 exertional activities and associated with fatigue. Dyspnea rated as moderate and does improve with rest. In addition, her low grade fever continue despite the levaquin. Also, reporting dry cough x 1 week Not sure anti-coagulation started for DVT neck but med list shows ARIXTRA since 6/11 by DR Donnie Coffin and unclear if helping fevers yet.  Walking  In office today  - walked only 185 feet x 2 laps and stopped due to dizziness, dyspnea. 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 Past Medical History  Diagnosis Date  . Hypertension   . Obesity   . Diverticulitis   . Hemorrhoids   . Colonic polyp   . GERD (gastroesophageal reflux disease)   . Cancer     BREAST, FALLOPIAN TUBE,  . Blood clot of neck vein      Family History  Problem Relation Age of Onset  . Cancer Mother   . Hypertension Mother   . Diabetes Father   . Heart disease Father   . Hypertension Father   . Stroke Father   . Hypertension Sister   . Hypertension Brother   . Hypertension Paternal Aunt   . Diabetes Paternal Aunt   . Hypertension Paternal Uncle   . Diabetes Paternal Uncle   . Hypertension Paternal Grandmother   . Hypertension Paternal Grandfather       History   Social History  . Marital Status: Divorced    Spouse Name: N/A    Number of Children: N/A  . Years of Education: N/A   Occupational History  . diabled    Social History Main Topics  . Smoking status: Former Smoker -- 0.5 packs/day for 7 years    Types: Cigarettes    Quit date: 08/07/2006  . Smokeless tobacco: Never Used  . Alcohol Use: No     occas  . Drug Use: Not on file  . Sexually Active: No   Other Topics Concern  . Not on file   Social History Narrative  . No narrative on file     Allergies  Allergen Reactions  . Codeine Itching  . Oxycodone Nausea Only  . Vicodin (Hydrocodone-Acetaminophen) Nausea Only     Outpatient Prescriptions Prior to Visit  Medication Sig Dispense Refill  . Cholecalciferol (VITAMIN D3) 3000 UNITS TABS Take 3,000 Units by mouth daily.       Marland Kitchen lidocaine-prilocaine (EMLA) cream Apply topically as needed. For PAC  30 g  1  . lisinopril-hydrochlorothiazide (PRINZIDE,ZESTORETIC) 10-12.5 MG per tablet Take 1 tablet by mouth daily with breakfast.      . LORazepam (ATIVAN) 0.5 MG tablet Take 1 tablet (0.5 mg total) by mouth every 6 (six) hours as needed (Nausea or vomiting).  30 tablet  0  . ondansetron (ZOFRAN) 8 MG tablet Take 1 tab two times a day starting the day after chemo for 3 days. Then take 1 tab two times a day as needed for nausea or vomiting.  30 tablet  1  . prochlorperazine (COMPAZINE) 10 MG tablet Take 1 tablet (10 mg total) by mouth every 6 (six) hours as needed (Nausea or vomiting).  30 tablet  1   Facility-Administered Medications Prior to Visit  Medication Dose Route Frequency Provider Last Rate Last Dose  . fondaparinux (ARIXTRA) injection 7.5 mg  7.5 mg Subcutaneous Q24H Pierce Crane, MD   7.5 mg at 07/17/11 1327        Review of Systems  Constitutional: Negative for fever and unexpected weight change.  HENT: Positive for sore throat. Negative for ear pain, nosebleeds, congestion, rhinorrhea, sneezing,  trouble swallowing, dental problem, postnasal drip and sinus pressure.   Eyes: Negative for redness and itching.  Respiratory: Positive for shortness of breath. Negative for cough, chest tightness and wheezing.   Cardiovascular: Positive for palpitations and leg swelling.  Gastrointestinal: Negative for nausea and vomiting.  Genitourinary: Negative for dysuria.  Musculoskeletal: Negative for joint swelling.  Skin: Negative for rash.  Neurological: Negative for headaches.  Hematological: Does not bruise/bleed easily.  Psychiatric/Behavioral: Negative for dysphoric  mood. The patient is not nervous/anxious.        Objective:   Physical Exam  Vitals reviewed. Constitutional: She is oriented to person, place, and time. She appears well-developed and well-nourished. No distress.       Obese Body mass index is 39.72 kg/(m^2). Chronic unwell looking  HENT:  Head: Normocephalic and atraumatic.  Right Ear: External ear normal.  Left Ear: External ear normal.  Mouth/Throat: Oropharynx is clear and moist. No oropharyngeal exudate.  Eyes: Conjunctivae and EOM are normal. Pupils are equal, round, and reactive to light. Right eye exhibits no discharge. Left eye exhibits no discharge. No scleral icterus.  Neck: Normal range of motion. Neck supple. No JVD present. No tracheal deviation present. No thyromegaly present.       Right neck indurated and tender  Cardiovascular: Normal rate, regular rhythm, normal heart sounds and intact distal pulses.  Exam reveals no gallop and no friction rub.   No murmur heard. Pulmonary/Chest: Effort normal and breath sounds normal. No respiratory distress. She has no wheezes. She has no rales. She exhibits no tenderness.  Abdominal: Soft. Bowel sounds are normal. She exhibits no distension and no mass. There is no tenderness. There is no rebound and no guarding.       obese  Musculoskeletal: Normal range of motion. She exhibits no edema and no tenderness.       Has  cane  Lymphadenopathy:    She has no cervical adenopathy.  Neurological: She is alert and oriented to person, place, and time. She has normal reflexes. No cranial nerve deficit. She exhibits normal muscle tone. Coordination normal.  Skin: Skin is warm and dry. No rash noted. She is not diaphoretic. No erythema. No pallor.  Psychiatric: She has a normal mood and affect. Her behavior is normal. Judgment and thought content normal.       Pleasant but poor historian          Assessment & Plan:

## 2011-07-18 NOTE — Patient Instructions (Addendum)
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

## 2011-07-19 ENCOUNTER — Encounter: Payer: Self-pay | Admitting: Internal Medicine

## 2011-07-19 ENCOUNTER — Encounter: Payer: Self-pay | Admitting: Family Medicine

## 2011-07-19 ENCOUNTER — Ambulatory Visit (HOSPITAL_BASED_OUTPATIENT_CLINIC_OR_DEPARTMENT_OTHER): Payer: Medicare Other

## 2011-07-19 ENCOUNTER — Telehealth: Payer: Self-pay | Admitting: Internal Medicine

## 2011-07-19 ENCOUNTER — Ambulatory Visit (INDEPENDENT_AMBULATORY_CARE_PROVIDER_SITE_OTHER): Payer: Medicare Other | Admitting: Family Medicine

## 2011-07-19 VITALS — BP 112/74 | HR 112 | Temp 98.9°F

## 2011-07-19 VITALS — BP 122/80 | HR 119 | Wt 207.0 lb

## 2011-07-19 DIAGNOSIS — T451X5A Adverse effect of antineoplastic and immunosuppressive drugs, initial encounter: Secondary | ICD-10-CM

## 2011-07-19 DIAGNOSIS — T44905A Adverse effect of unspecified drugs primarily affecting the autonomic nervous system, initial encounter: Secondary | ICD-10-CM

## 2011-07-19 DIAGNOSIS — R05 Cough: Secondary | ICD-10-CM | POA: Insufficient documentation

## 2011-07-19 DIAGNOSIS — J849 Interstitial pulmonary disease, unspecified: Secondary | ICD-10-CM | POA: Insufficient documentation

## 2011-07-19 DIAGNOSIS — I82890 Acute embolism and thrombosis of other specified veins: Secondary | ICD-10-CM

## 2011-07-19 DIAGNOSIS — I82409 Acute embolism and thrombosis of unspecified deep veins of unspecified lower extremity: Secondary | ICD-10-CM

## 2011-07-19 DIAGNOSIS — C5700 Malignant neoplasm of unspecified fallopian tube: Secondary | ICD-10-CM

## 2011-07-19 DIAGNOSIS — R0600 Dyspnea, unspecified: Secondary | ICD-10-CM | POA: Insufficient documentation

## 2011-07-19 DIAGNOSIS — R000 Tachycardia, unspecified: Secondary | ICD-10-CM | POA: Insufficient documentation

## 2011-07-19 DIAGNOSIS — I10 Essential (primary) hypertension: Secondary | ICD-10-CM

## 2011-07-19 DIAGNOSIS — I82C19 Acute embolism and thrombosis of unspecified internal jugular vein: Secondary | ICD-10-CM

## 2011-07-19 DIAGNOSIS — C50919 Malignant neoplasm of unspecified site of unspecified female breast: Secondary | ICD-10-CM

## 2011-07-19 MED ORDER — FONDAPARINUX SODIUM 7.5 MG/0.6ML ~~LOC~~ SOLN
7.5000 mg | Freq: Once | SUBCUTANEOUS | Status: AC
  Administered 2011-07-19: 7.5 mg via SUBCUTANEOUS

## 2011-07-19 MED ORDER — AMLODIPINE BESYLATE 5 MG PO TABS: 5.0000 mg | ORAL_TABLET | Freq: Every day | ORAL | Status: DC

## 2011-07-19 NOTE — Telephone Encounter (Signed)
1 the discharge diagnoses with sepsis. You can give her the other diagnoses, discharge summary and see if that'll help.

## 2011-07-19 NOTE — Assessment & Plan Note (Signed)
Though acute could be related to ACE inhibitor. I have sent note to DR Lalond to change to ARB or Calcium channel blocker. Called his office but it was closed

## 2011-07-19 NOTE — Assessment & Plan Note (Signed)
This appears more tied to acute issues like fever and fatigue and sinus tachy. See section on sinus tach

## 2011-07-19 NOTE — Progress Notes (Signed)
  Subjective:    Patient ID: Abigail [name], female    DOB: [birth year], 56 y.o.   MRN: [id number]  HPI She is here for recheck. She was recently seen by her oncologist. That note was reviewed. His assessment is most of the difficulty she has had recently has been related to her chemotherapy. She was recently diagnosed with right jugular vein thrombosis and presently is on Lovenox. He is apparently going to followup on this in approximately one week. She was also recently seen by her pulmonologist. She has had difficulty recently with a cough and he is concerned that this could be an ACE cough. She continues to have some difficulty with shortness of breath.   Review of Systems     Objective:   Physical Exam Alert and in no distress. Lungs are clear to auscultation. Cardiac exam shows regular rhythm without murmurs or gallops.       Assessment & Plan:   1. Jugular vein thrombosis, right    2. Chemotherapy adverse reaction    3. ACE-inhibitor cough    4. Hypertension  amLODipine (NORVASC) 5 MG tablet   recommend she continue to followup with her oncologist. I will recheck her blood pressure in approximately one month.

## 2011-07-19 NOTE — Assessment & Plan Note (Signed)
Having easy sinus tachycardia. Multiple medical causes possible to explain this from fever to anemia but given extreme tachycardia (HR 155 with simple 100 feet exertion), will get cards to evaluate. I have referred to [place]; I spoke to Dr Marca Ancona

## 2011-07-19 NOTE — Patient Instructions (Signed)
Continue follow up with Dr. Caron Presume and call me if you have any questions

## 2011-07-19 NOTE — Telephone Encounter (Signed)
Called Cindy back and let her know there is no other DX I could give her she said ok

## 2011-07-19 NOTE — Telephone Encounter (Signed)
Cheri, I am forwarding this message to you so you can discuss this with Dr. Susann Givens. The pt has an appt today with you. Thanks. Carron Curie, CMA

## 2011-07-19 NOTE — Telephone Encounter (Signed)
I just now got your note and pt has already seen Dr.Lalonde

## 2011-07-19 NOTE — Assessment & Plan Note (Signed)
This appears mild in base on CT June 2013 but unchanged since 2009. There are calcified nodes. 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 I do not think this is cause of acute/sub acute resp symptoms because she did not desaturate when she walked in office (but got tachycardic) . But needs investigation. I will get PFT in 1 month

## 2011-07-19 NOTE — Telephone Encounter (Signed)
Dr.Lalonde did you talk with PT about this

## 2011-07-19 NOTE — Telephone Encounter (Signed)
Please call Dr Susann Givens office and ensure she is off ACE inhibitor and instead Iprefer ARB or calcium channel blocker

## 2011-07-20 ENCOUNTER — Ambulatory Visit (HOSPITAL_BASED_OUTPATIENT_CLINIC_OR_DEPARTMENT_OTHER): Payer: Medicare Other

## 2011-07-20 VITALS — BP 123/79 | HR 123 | Temp 98.5°F

## 2011-07-20 DIAGNOSIS — I82C19 Acute embolism and thrombosis of unspecified internal jugular vein: Secondary | ICD-10-CM

## 2011-07-20 DIAGNOSIS — I82409 Acute embolism and thrombosis of unspecified deep veins of unspecified lower extremity: Secondary | ICD-10-CM

## 2011-07-20 MED ORDER — FONDAPARINUX SODIUM 5 MG/0.4ML ~~LOC~~ SOLN
7.5000 mg | Freq: Once | SUBCUTANEOUS | Status: AC
  Administered 2011-07-20: 7.5 mg via SUBCUTANEOUS
  Filled 2011-07-20: qty 0.8

## 2011-07-21 ENCOUNTER — Ambulatory Visit
Admission: RE | Admit: 2011-07-21 | Discharge: 2011-07-21 | Disposition: A | Discharge status: Home / Self Care | Payer: Medicare Other | Source: Ambulatory Visit | Attending: Oncology | Admitting: Oncology

## 2011-07-21 ENCOUNTER — Ambulatory Visit (HOSPITAL_BASED_OUTPATIENT_CLINIC_OR_DEPARTMENT_OTHER): Payer: Medicare Other

## 2011-07-21 VITALS — BP 122/86 | HR 123 | Temp 98.8°F

## 2011-07-21 DIAGNOSIS — I82409 Acute embolism and thrombosis of unspecified deep veins of unspecified lower extremity: Secondary | ICD-10-CM

## 2011-07-21 DIAGNOSIS — I82C19 Acute embolism and thrombosis of unspecified internal jugular vein: Secondary | ICD-10-CM

## 2011-07-21 DIAGNOSIS — C50919 Malignant neoplasm of unspecified site of unspecified female breast: Secondary | ICD-10-CM

## 2011-07-21 DIAGNOSIS — C5700 Malignant neoplasm of unspecified fallopian tube: Secondary | ICD-10-CM

## 2011-07-21 MED ORDER — GADOBENATE DIMEGLUMINE 529 MG/ML IV SOLN
20.0000 mL | Freq: Once | INTRAVENOUS | Status: AC | PRN
  Administered 2011-07-21: 20 mL via INTRAVENOUS

## 2011-07-21 MED ORDER — FONDAPARINUX SODIUM 5 MG/0.4ML ~~LOC~~ SOLN
7.5000 mg | Freq: Once | SUBCUTANEOUS | Status: AC
  Administered 2011-07-21: 7.5 mg via SUBCUTANEOUS

## 2011-07-24 ENCOUNTER — Telehealth: Payer: Self-pay | Admitting: *Deleted

## 2011-07-24 ENCOUNTER — Ambulatory Visit (HOSPITAL_BASED_OUTPATIENT_CLINIC_OR_DEPARTMENT_OTHER): Payer: Medicare Other | Admitting: Physician Assistant

## 2011-07-24 ENCOUNTER — Other Ambulatory Visit (HOSPITAL_BASED_OUTPATIENT_CLINIC_OR_DEPARTMENT_OTHER): Payer: Medicare Other | Admitting: Lab

## 2011-07-24 VITALS — BP 138/88 | HR 137 | Temp 98.4°F | Ht 61.0 in | Wt 208.2 lb

## 2011-07-24 DIAGNOSIS — I82C19 Acute embolism and thrombosis of unspecified internal jugular vein: Secondary | ICD-10-CM

## 2011-07-24 DIAGNOSIS — O2230 Deep phlebothrombosis in pregnancy, unspecified trimester: Secondary | ICD-10-CM

## 2011-07-24 DIAGNOSIS — I82409 Acute embolism and thrombosis of unspecified deep veins of unspecified lower extremity: Secondary | ICD-10-CM

## 2011-07-24 DIAGNOSIS — C5700 Malignant neoplasm of unspecified fallopian tube: Secondary | ICD-10-CM

## 2011-07-24 DIAGNOSIS — Z8501 Personal history of malignant neoplasm of esophagus: Secondary | ICD-10-CM

## 2011-07-24 LAB — CBC & DIFF AND RETIC
Basophils Absolute: 0 10*3/uL (ref 0.0–0.1)
EOS%: 1.6 % (ref 0.0–7.0)
Eosinophils Absolute: 0.1 10*3/uL (ref 0.0–0.5)
HCT: 25.3 % — ABNORMAL LOW (ref 34.8–46.6)
HGB: 8.4 g/dL — ABNORMAL LOW (ref 11.6–15.9)
Immature Retic Fract: 20.4 % — ABNORMAL HIGH (ref 1.60–10.00)
MCH: 32.3 pg (ref 25.1–34.0)
NEUT#: 5.8 10*3/uL (ref 1.5–6.5)
NEUT%: 65.7 % (ref 38.4–76.8)
RDW: 17.2 % — ABNORMAL HIGH (ref 11.2–14.5)
Retic %: 3.99 % — ABNORMAL HIGH (ref 0.70–2.10)
Retic Ct Abs: 103.74 10*3/uL — ABNORMAL HIGH (ref 33.70–90.70)
lymph#: 1.9 10*3/uL (ref 0.9–3.3)

## 2011-07-24 LAB — PROTIME-INR: Protime: 13.2 Seconds (ref 10.6–13.4)

## 2011-07-24 MED ORDER — FONDAPARINUX SODIUM 5 MG/0.4ML ~~LOC~~ SOLN
7.5000 mg | Freq: Once | SUBCUTANEOUS | Status: AC
  Administered 2011-07-24: 7.5 mg via SUBCUTANEOUS
  Filled 2011-07-24: qty 0.8

## 2011-07-24 NOTE — Progress Notes (Signed)
Hematology and Oncology Follow Up Visit  Sherryl Valido [id number] 23-Oct-1955 56 y.o. 07/24/2011    HPI: Ms. Piazza is a 56 year old [name] with recurrent fallopian tube/ovarian carcinoma for which she completed 2 cycles of carboplatin/Taxol, cycle 3 held for one week ago due to complicated medical history including 2 hospitalizations following each cycle of chemotherapy.  2. Newly diagnosed right jugular vein DVT course of  Arixtra 7.5 mg subcutaneous daily.  3. Previous history of breast carcinoma, BCRA positive.  Interim History:   Dorann is seen today in followup. Of note, cycle 3 of carboplatin/Taxol was held one week ago due to complicated medical history and treatment course following her first 2 cycles. Of note, she was also diagnosed with a DVT of the right jugular vein region. She was initiated on Arixtra 7.5 mg subcutaneous daily on 07/17/2011, she states that "my last dose was on 07/21/2011." She had not been initiated on Coumadin. Despite this, she feels that her right neck has improved. Of note, she has ongoing tachycardia, and is due to see Dr. Antoine Poche on 08/16/2011.  She is scheduled for pulmonary function test on 08/14/2011. She feels pretty well, denying any unexplained fevers, chills, or night sweats. She does get short of breath with exertion but has not noted this is been any different. She denies any chest pain. She has not noted any nausea, vomiting, diarrhea, or constipation issues. A detailed review of systems is otherwise noncontributory as noted below.  Review of Systems: Constitutional:  no weight loss, fever, night sweats, feels well and fatigued Eyes: no complaints ENT: no complaints Cardiovascular: positive for - shortness of breath Respiratory: as above. Neurological: no TIA or stroke symptoms Dermatological: negative Gastrointestinal: no abdominal pain, change in bowel habits, or black or bloody stools Genito-Urinary: no  dysuria, trouble voiding, or hematuria Hematological and Lymphatic: negative Breast: negative Musculoskeletal: negative Remaining ROS negative.  Medications:   I have reviewed the patient's current medications.  Current Outpatient Prescriptions  Medication Sig Dispense Refill  . amLODipine (NORVASC) 5 MG tablet Take 1 tablet (5 mg total) by mouth daily.  30 tablet  11  . Cholecalciferol (VITAMIN D3) 3000 UNITS TABS Take 3,000 Units by mouth daily.       Marland Kitchen lidocaine-prilocaine (EMLA) cream Apply topically as needed. For PAC  30 g  1  . lisinopril-hydrochlorothiazide (PRINZIDE,ZESTORETIC) 10-12.5 MG per tablet Take 1 tablet by mouth daily with breakfast.      . Enoxaparin Sodium (LOVENOX IJ) Inject as directed.      Marland Kitchen LORazepam (ATIVAN) 0.5 MG tablet Take 1 tablet (0.5 mg total) by mouth every 6 (six) hours as needed (Nausea or vomiting).  30 tablet  0  . ondansetron (ZOFRAN) 8 MG tablet Take 1 tab two times a day starting the day after chemo for 3 days. Then take 1 tab two times a day as needed for nausea or vomiting.  30 tablet  1  . prochlorperazine (COMPAZINE) 10 MG tablet Take 1 tablet (10 mg total) by mouth every 6 (six) hours as needed (Nausea or vomiting).  30 tablet  1   Current Facility-Administered Medications  Medication Dose Route Frequency Provider Last Rate Last Dose  . fondaparinux (ARIXTRA) injection 7.5 mg  7.5 mg Subcutaneous Once Pierce Crane, MD   7.5 mg at 07/24/11 1123   Facility-Administered Medications Ordered in Other Visits  Medication Dose Route Frequency Provider Last Rate Last Dose  . fondaparinux (ARIXTRA) injection 7.5 mg  7.5 mg Subcutaneous Q24H  Pierce Crane, MD   7.5 mg at 07/17/11 1327  . fondaparinux (ARIXTRA) injection 7.5 mg  7.5 mg Subcutaneous Q24H Pierce Crane, MD   7.5 mg at 07/18/11 1610    Allergies:  Allergies  Allergen Reactions  . Codeine Itching  . Oxycodone Nausea Only  . Vicodin (Hydrocodone-Acetaminophen) Nausea Only    Physical  Exam: Filed Vitals:   07/24/11 1020  BP: 138/88  Pulse: 137  Temp: 98.4 F (36.9 C)    Body mass index is 39.34 kg/(m^2). HEENT:  Sclerae anicteric, conjunctivae pink.  Oropharynx clear.  No mucositis or candidiasis.  No evidence of  frank distention over right jugular vein region. Nodes:  No cervical, supraclavicular, or axillary lymphadenopathy palpated.  Breast Exam:  Deferred.  Lungs:  Clear to auscultation bilaterally.  No crackles, rhonchi, or wheezes.   Heart:  Tachycardic rate, but normal rhythm.   Abdomen:  Soft, nontender.  Positive bowel sounds.  No organomegaly or masses palpated.   Musculoskeletal:  No focal spinal tenderness to palpation.  Extremities:  Benign.  She does have evidence of bilateral edema, but no cyanosis.   Skin:  Benign.   Neuro:  Nonfocal, alert and oriented x 3.   Lab Results: Lab Results  Component Value Date   WBC 8.9 07/24/2011   HGB 8.4* 07/24/2011   HCT 25.3* 07/24/2011   MCV 97.3 07/24/2011   PLT 279 07/24/2011   NEUTROABS 5.8 07/24/2011     Chemistry      Component Value Date/Time   NA 133* 07/13/2011 1240   K 3.6 07/13/2011 1240   CL 98 07/13/2011 1240   CO2 21 07/13/2011 1240   BUN 6 07/13/2011 1240   CREATININE 0.84 07/13/2011 1240      Component Value Date/Time   CALCIUM 9.3 07/13/2011 1240   ALKPHOS 115 07/09/2011 0155   AST 14 07/09/2011 0155   ALT 12 07/09/2011 0155   BILITOT 0.3 07/09/2011 0155      Lab Results  Component Value Date   LABCA2 63* 05/04/2011     Assessment:  Ms. Barcelo is a 56 year old [name] with recurrent fallopian tube/ovarian carcinoma for which she completed 2 cycles of carboplatin/Taxol, cycle 3 held for one week ago due to complicated medical history including 2 hospitalizations following each cycle of chemotherapy.  2. Newly diagnosed right jugular vein DVT course of  Arixtra 7.5 mg subcutaneous daily.  3. Previous history of breast carcinoma, BCRA positive.  Case reviewed with Dr.  Pierce Crane.   Plan:  1. Ms. Topping will be restarted on Arixtra 7.5 mg daily to be given here in the office. She will also initiate Coumadin 5 mg daily on 07/25/2011. A repeat CBC and PT/INR will be obtained on 07/27/2011.  2. We will plan on repeating a 2-D echocardiogram prior to her one-week followup at which point we will plan on initiating her first weekly dose of Doxil.  This plan was reviewed with the patient, who voices understanding and agreement.  She knows to call with any changes or problems.    Sheamus Hasting T, PA-C 07/24/2011

## 2011-07-24 NOTE — Telephone Encounter (Signed)
Made patient appointment for 07-31-2011 added on injections for the patient per the orders from 07-24-2011 emailed michelle to set up patient's treatment

## 2011-07-25 ENCOUNTER — Ambulatory Visit (HOSPITAL_BASED_OUTPATIENT_CLINIC_OR_DEPARTMENT_OTHER): Payer: Medicare Other

## 2011-07-25 ENCOUNTER — Telehealth: Payer: Self-pay | Admitting: *Deleted

## 2011-07-25 VITALS — BP 136/88 | HR 114 | Temp 99.0°F

## 2011-07-25 DIAGNOSIS — I82890 Acute embolism and thrombosis of other specified veins: Secondary | ICD-10-CM

## 2011-07-25 DIAGNOSIS — I82409 Acute embolism and thrombosis of unspecified deep veins of unspecified lower extremity: Secondary | ICD-10-CM

## 2011-07-25 MED ORDER — FONDAPARINUX SODIUM 5 MG/0.4ML ~~LOC~~ SOLN
7.5000 mg | Freq: Once | SUBCUTANEOUS | Status: AC
  Administered 2011-07-25: 7.5 mg via SUBCUTANEOUS
  Filled 2011-07-25: qty 0.8

## 2011-07-25 NOTE — Telephone Encounter (Signed)
Per staff message and POF, I have tried to scheduled first treatment after MD visit on 6/25. There is no room, given back to scheduler. JMW

## 2011-07-25 NOTE — Telephone Encounter (Signed)
Per coversation with Thayer Ohm, [place] I have moved appts from 6/25 to first available on 6/27.  Abigail [name] [name]

## 2011-07-26 ENCOUNTER — Other Ambulatory Visit: Payer: Self-pay | Admitting: Radiology

## 2011-07-26 ENCOUNTER — Encounter: Payer: Self-pay | Admitting: Internal Medicine

## 2011-07-26 ENCOUNTER — Encounter: Payer: Self-pay | Admitting: Family Medicine

## 2011-07-26 ENCOUNTER — Other Ambulatory Visit: Payer: Self-pay | Admitting: Oncology

## 2011-07-26 ENCOUNTER — Ambulatory Visit (HOSPITAL_BASED_OUTPATIENT_CLINIC_OR_DEPARTMENT_OTHER): Payer: Medicare Other

## 2011-07-26 VITALS — BP 140/95 | HR 106 | Temp 98.3°F

## 2011-07-26 DIAGNOSIS — C50919 Malignant neoplasm of unspecified site of unspecified female breast: Secondary | ICD-10-CM

## 2011-07-26 DIAGNOSIS — I82409 Acute embolism and thrombosis of unspecified deep veins of unspecified lower extremity: Secondary | ICD-10-CM

## 2011-07-26 DIAGNOSIS — C5700 Malignant neoplasm of unspecified fallopian tube: Secondary | ICD-10-CM

## 2011-07-26 MED ORDER — FONDAPARINUX SODIUM 5 MG/0.4ML ~~LOC~~ SOLN
7.5000 mg | Freq: Once | SUBCUTANEOUS | Status: AC
  Administered 2011-07-26: 7.5 mg via SUBCUTANEOUS
  Filled 2011-07-26: qty 0.8

## 2011-07-27 ENCOUNTER — Other Ambulatory Visit: Payer: Self-pay | Admitting: Oncology

## 2011-07-27 ENCOUNTER — Other Ambulatory Visit (HOSPITAL_BASED_OUTPATIENT_CLINIC_OR_DEPARTMENT_OTHER): Payer: Medicare Other | Admitting: Lab

## 2011-07-27 ENCOUNTER — Ambulatory Visit (HOSPITAL_BASED_OUTPATIENT_CLINIC_OR_DEPARTMENT_OTHER): Payer: Medicare Other

## 2011-07-27 VITALS — BP 144/97 | HR 111 | Temp 97.8°F

## 2011-07-27 DIAGNOSIS — I82C19 Acute embolism and thrombosis of unspecified internal jugular vein: Secondary | ICD-10-CM

## 2011-07-27 DIAGNOSIS — I82409 Acute embolism and thrombosis of unspecified deep veins of unspecified lower extremity: Secondary | ICD-10-CM

## 2011-07-27 DIAGNOSIS — O2230 Deep phlebothrombosis in pregnancy, unspecified trimester: Secondary | ICD-10-CM

## 2011-07-27 DIAGNOSIS — C5700 Malignant neoplasm of unspecified fallopian tube: Secondary | ICD-10-CM

## 2011-07-27 DIAGNOSIS — Z1501 Genetic susceptibility to malignant neoplasm of breast: Secondary | ICD-10-CM

## 2011-07-27 LAB — PROTIME-INR: INR: 1.1 — ABNORMAL LOW (ref 2.00–3.50)

## 2011-07-27 LAB — CBC WITH DIFFERENTIAL/PLATELET
BASO%: 0.2 % (ref 0.0–2.0)
EOS%: 0.6 % (ref 0.0–7.0)
HCT: 25.1 % — ABNORMAL LOW (ref 34.8–46.6)
LYMPH%: 17.8 % (ref 14.0–49.7)
MCH: 32.8 pg (ref 25.1–34.0)
MCHC: 33.5 g/dL (ref 31.5–36.0)
NEUT%: 72.9 % (ref 38.4–76.8)
RBC: 2.56 10*6/uL — ABNORMAL LOW (ref 3.70–5.45)
lymph#: 1.5 10*3/uL (ref 0.9–3.3)
nRBC: 0 % (ref 0–0)

## 2011-07-27 MED ORDER — FONDAPARINUX SODIUM 7.5 MG/0.6ML ~~LOC~~ SOLN
7.5000 mg | Freq: Once | SUBCUTANEOUS | Status: AC
  Administered 2011-07-27: 7.5 mg via SUBCUTANEOUS
  Filled 2011-07-27: qty 0.6

## 2011-07-27 NOTE — Progress Notes (Signed)
Spoke with Dr Donnie Coffin about Deijah's INR 1.1.  Patient to continue getting Arixtra, increase coumadin to 10mg  daily and recheck labs on Monday.  Pt states understanding of instruction and repeated back to me.

## 2011-07-28 ENCOUNTER — Ambulatory Visit (HOSPITAL_BASED_OUTPATIENT_CLINIC_OR_DEPARTMENT_OTHER): Payer: Medicare Other

## 2011-07-28 VITALS — BP 121/87 | HR 134 | Temp 98.7°F

## 2011-07-28 DIAGNOSIS — I82C19 Acute embolism and thrombosis of unspecified internal jugular vein: Secondary | ICD-10-CM

## 2011-07-28 DIAGNOSIS — I82409 Acute embolism and thrombosis of unspecified deep veins of unspecified lower extremity: Secondary | ICD-10-CM

## 2011-07-28 MED ORDER — FONDAPARINUX SODIUM 5 MG/0.4ML ~~LOC~~ SOLN
7.5000 mg | Freq: Once | SUBCUTANEOUS | Status: AC
  Administered 2011-07-28: 7.5 mg via SUBCUTANEOUS

## 2011-07-28 NOTE — Progress Notes (Signed)
30 Artixtra administered today. Patient educated on administering Arixtra on Sunday to self. Answered all questions patient had with regards to self administration. AVS given.

## 2011-07-29 ENCOUNTER — Ambulatory Visit
Admission: RE | Admit: 2011-07-29 | Discharge: 2011-07-29 | Disposition: A | Discharge status: Home / Self Care | Payer: Medicare Other | Source: Ambulatory Visit | Attending: Oncology | Admitting: Oncology

## 2011-07-29 DIAGNOSIS — C5700 Malignant neoplasm of unspecified fallopian tube: Secondary | ICD-10-CM

## 2011-07-29 DIAGNOSIS — C50919 Malignant neoplasm of unspecified site of unspecified female breast: Secondary | ICD-10-CM

## 2011-07-30 ENCOUNTER — Other Ambulatory Visit (HOSPITAL_BASED_OUTPATIENT_CLINIC_OR_DEPARTMENT_OTHER): Payer: Medicare Other | Admitting: Lab

## 2011-07-30 ENCOUNTER — Ambulatory Visit (HOSPITAL_BASED_OUTPATIENT_CLINIC_OR_DEPARTMENT_OTHER): Payer: Medicare Other

## 2011-07-30 ENCOUNTER — Other Ambulatory Visit: Payer: Self-pay | Admitting: Oncology

## 2011-07-30 VITALS — BP 136/88 | HR 112 | Temp 98.4°F

## 2011-07-30 DIAGNOSIS — I82C19 Acute embolism and thrombosis of unspecified internal jugular vein: Secondary | ICD-10-CM

## 2011-07-30 DIAGNOSIS — Z1501 Genetic susceptibility to malignant neoplasm of breast: Secondary | ICD-10-CM

## 2011-07-30 DIAGNOSIS — I82409 Acute embolism and thrombosis of unspecified deep veins of unspecified lower extremity: Secondary | ICD-10-CM

## 2011-07-30 DIAGNOSIS — C5700 Malignant neoplasm of unspecified fallopian tube: Secondary | ICD-10-CM

## 2011-07-30 DIAGNOSIS — O2230 Deep phlebothrombosis in pregnancy, unspecified trimester: Secondary | ICD-10-CM

## 2011-07-30 LAB — CBC WITH DIFFERENTIAL/PLATELET
BASO%: 0.4 % (ref 0.0–2.0)
Eosinophils Absolute: 0.1 10*3/uL (ref 0.0–0.5)
MCHC: 32.7 g/dL (ref 31.5–36.0)
MONO#: 0.6 10*3/uL (ref 0.1–0.9)
NEUT#: 3.7 10*3/uL (ref 1.5–6.5)
RBC: 2.5 10*6/uL — ABNORMAL LOW (ref 3.70–5.45)
RDW: 18.6 % — ABNORMAL HIGH (ref 11.2–14.5)
WBC: 5.5 10*3/uL (ref 3.9–10.3)

## 2011-07-30 LAB — COMPREHENSIVE METABOLIC PANEL
AST: 19 U/L (ref 0–37)
Albumin: 3.7 g/dL (ref 3.5–5.2)
Alkaline Phosphatase: 84 U/L (ref 39–117)
BUN: 10 mg/dL (ref 6–23)
Glucose, Bld: 106 mg/dL — ABNORMAL HIGH (ref 70–99)
Potassium: 3.4 mEq/L — ABNORMAL LOW (ref 3.5–5.3)
Sodium: 134 mEq/L — ABNORMAL LOW (ref 135–145)
Total Bilirubin: 0.4 mg/dL (ref 0.3–1.2)
Total Protein: 7.3 g/dL (ref 6.0–8.3)

## 2011-07-30 LAB — PROTIME-INR
INR: 1.4 — ABNORMAL LOW (ref 2.00–3.50)
Protime: 16.8 Seconds — ABNORMAL HIGH (ref 10.6–13.4)

## 2011-07-30 MED ORDER — FONDAPARINUX SODIUM 7.5 MG/0.6ML ~~LOC~~ SOLN
7.5000 mg | Freq: Once | SUBCUTANEOUS | Status: AC
  Administered 2011-07-30: 7.5 mg via SUBCUTANEOUS
  Filled 2011-07-30: qty 0.6

## 2011-07-30 MED ORDER — FONDAPARINUX SODIUM 7.5 MG/0.6ML ~~LOC~~ SOLN: 7.5000 mg | SUBCUTANEOUS | Status: DC

## 2011-07-31 ENCOUNTER — Ambulatory Visit: Payer: Medicare Other | Admitting: Physician Assistant

## 2011-07-31 ENCOUNTER — Other Ambulatory Visit: Payer: Medicare Other | Admitting: Lab

## 2011-07-31 ENCOUNTER — Ambulatory Visit (HOSPITAL_BASED_OUTPATIENT_CLINIC_OR_DEPARTMENT_OTHER): Payer: Medicare Other

## 2011-07-31 VITALS — BP 128/88 | HR 107 | Temp 97.7°F

## 2011-07-31 DIAGNOSIS — I82409 Acute embolism and thrombosis of unspecified deep veins of unspecified lower extremity: Secondary | ICD-10-CM

## 2011-07-31 MED ORDER — FONDAPARINUX SODIUM 5 MG/0.4ML ~~LOC~~ SOLN
7.5000 mg | Freq: Once | SUBCUTANEOUS | Status: AC
  Administered 2011-07-31: 7.5 mg via SUBCUTANEOUS
  Filled 2011-07-31: qty 0.8

## 2011-08-01 ENCOUNTER — Ambulatory Visit (HOSPITAL_BASED_OUTPATIENT_CLINIC_OR_DEPARTMENT_OTHER): Payer: Medicare Other

## 2011-08-01 VITALS — BP 131/88 | HR 105 | Temp 98.6°F

## 2011-08-01 DIAGNOSIS — I82409 Acute embolism and thrombosis of unspecified deep veins of unspecified lower extremity: Secondary | ICD-10-CM

## 2011-08-01 DIAGNOSIS — I82C19 Acute embolism and thrombosis of unspecified internal jugular vein: Secondary | ICD-10-CM

## 2011-08-01 MED ORDER — FONDAPARINUX SODIUM 7.5 MG/0.6ML ~~LOC~~ SOLN
7.5000 mg | Freq: Once | SUBCUTANEOUS | Status: AC
  Administered 2011-08-01: 7.5 mg via SUBCUTANEOUS
  Filled 2011-08-01: qty 0.6

## 2011-08-02 ENCOUNTER — Telehealth: Payer: Self-pay | Admitting: Oncology

## 2011-08-02 ENCOUNTER — Other Ambulatory Visit: Payer: Self-pay | Admitting: Oncology

## 2011-08-02 ENCOUNTER — Ambulatory Visit (HOSPITAL_BASED_OUTPATIENT_CLINIC_OR_DEPARTMENT_OTHER): Payer: Medicare Other

## 2011-08-02 ENCOUNTER — Other Ambulatory Visit: Payer: Medicare Other | Admitting: Lab

## 2011-08-02 ENCOUNTER — Ambulatory Visit (HOSPITAL_BASED_OUTPATIENT_CLINIC_OR_DEPARTMENT_OTHER): Payer: Medicare Other | Admitting: Physician Assistant

## 2011-08-02 VITALS — BP 132/91 | HR 105 | Temp 98.9°F

## 2011-08-02 VITALS — BP 139/88 | HR 119 | Temp 98.4°F | Ht 61.0 in | Wt 204.5 lb

## 2011-08-02 DIAGNOSIS — C5700 Malignant neoplasm of unspecified fallopian tube: Secondary | ICD-10-CM

## 2011-08-02 DIAGNOSIS — Z1501 Genetic susceptibility to malignant neoplasm of breast: Secondary | ICD-10-CM

## 2011-08-02 DIAGNOSIS — C50919 Malignant neoplasm of unspecified site of unspecified female breast: Secondary | ICD-10-CM

## 2011-08-02 DIAGNOSIS — O2230 Deep phlebothrombosis in pregnancy, unspecified trimester: Secondary | ICD-10-CM

## 2011-08-02 DIAGNOSIS — Z853 Personal history of malignant neoplasm of breast: Secondary | ICD-10-CM

## 2011-08-02 DIAGNOSIS — Z5111 Encounter for antineoplastic chemotherapy: Secondary | ICD-10-CM

## 2011-08-02 DIAGNOSIS — I82890 Acute embolism and thrombosis of other specified veins: Secondary | ICD-10-CM

## 2011-08-02 LAB — CBC WITH DIFFERENTIAL/PLATELET
BASO%: 0.3 % (ref 0.0–2.0)
EOS%: 1.4 % (ref 0.0–7.0)
MCH: 31.6 pg (ref 25.1–34.0)
MCHC: 32.8 g/dL (ref 31.5–36.0)
MCV: 96.4 fL (ref 79.5–101.0)
MONO%: 8.3 % (ref 0.0–14.0)
RBC: 2.53 10*6/uL — ABNORMAL LOW (ref 3.70–5.45)
RDW: 17.3 % — ABNORMAL HIGH (ref 11.2–14.5)
lymph#: 1.5 10*3/uL (ref 0.9–3.3)
nRBC: 0 % (ref 0–0)

## 2011-08-02 LAB — BASIC METABOLIC PANEL
BUN: 7 mg/dL (ref 6–23)
Chloride: 99 mEq/L (ref 96–112)
Creatinine, Ser: 0.71 mg/dL (ref 0.50–1.10)

## 2011-08-02 LAB — PROTIME-INR
INR: 2.7 (ref 2.00–3.50)
Protime: 32.4 Seconds — ABNORMAL HIGH (ref 10.6–13.4)

## 2011-08-02 MED ORDER — SODIUM CHLORIDE 0.9 % IV SOLN
Freq: Once | INTRAVENOUS | Status: AC
  Administered 2011-08-02: 13:00:00 via INTRAVENOUS

## 2011-08-02 MED ORDER — LORAZEPAM 0.5 MG PO TABS: 0.5000 mg | ORAL_TABLET | Freq: Four times a day (QID) | ORAL | Status: AC | PRN

## 2011-08-02 MED ORDER — PROCHLORPERAZINE 25 MG RE SUPP: 25.0000 mg | Freq: Two times a day (BID) | RECTAL | Status: DC | PRN

## 2011-08-02 MED ORDER — ONDANSETRON HCL 8 MG PO TABS: ORAL_TABLET | ORAL | Status: DC

## 2011-08-02 MED ORDER — ONDANSETRON 8 MG/50ML IVPB (CHCC)
8.0000 mg | Freq: Once | INTRAVENOUS | Status: AC
  Administered 2011-08-02: 8 mg via INTRAVENOUS

## 2011-08-02 MED ORDER — DEXAMETHASONE 4 MG PO TABS: ORAL_TABLET | ORAL | Status: DC

## 2011-08-02 MED ORDER — DEXAMETHASONE SODIUM PHOSPHATE 10 MG/ML IJ SOLN
10.0000 mg | Freq: Once | INTRAMUSCULAR | Status: AC
  Administered 2011-08-02: 10 mg via INTRAVENOUS

## 2011-08-02 MED ORDER — SODIUM CHLORIDE 0.9 % IJ SOLN
10.0000 mL | INTRAMUSCULAR | Status: DC | PRN
  Administered 2011-08-02: 10 mL
  Filled 2011-08-02: qty 10

## 2011-08-02 MED ORDER — PROCHLORPERAZINE MALEATE 10 MG PO TABS: 10.0000 mg | ORAL_TABLET | Freq: Four times a day (QID) | ORAL | Status: DC | PRN

## 2011-08-02 MED ORDER — HEPARIN SOD (PORK) LOCK FLUSH 100 UNIT/ML IV SOLN
500.0000 [IU] | Freq: Once | INTRAVENOUS | Status: AC | PRN
  Administered 2011-08-02: 500 [IU]
  Filled 2011-08-02: qty 5

## 2011-08-02 MED ORDER — DOXORUBICIN HCL LIPOSOMAL CHEMO INJECTION 2 MG/ML
30.0000 mg/m2 | Freq: Once | INTRAVENOUS | Status: AC
  Administered 2011-08-02: 60 mg via INTRAVENOUS
  Filled 2011-08-02: qty 30

## 2011-08-02 NOTE — Telephone Encounter (Signed)
gve the pt her July 2013 appt calendar. Pt is aware her chemo appts will be added. Sent michelle a staff message

## 2011-08-02 NOTE — Progress Notes (Signed)
Review of chart to determine required labs for next appt-CTS

## 2011-08-02 NOTE — Patient Instructions (Addendum)
[facility] Discharge Instructions for Patients Receiving Chemotherapy  Today you received the following chemotherapy agents Doxil  To help prevent nausea and vomiting after your treatment, we encourage you to take your nausea medication.   If you develop nausea and vomiting that is not controlled by your nausea medication, call the clinic. If it is after clinic hours your family physician or the after hours number for the clinic or go to the Emergency Department.   BELOW ARE SYMPTOMS THAT SHOULD BE REPORTED IMMEDIATELY:  *FEVER GREATER THAN 100.5 F  *CHILLS WITH OR WITHOUT FEVER  NAUSEA AND VOMITING THAT IS NOT CONTROLLED WITH YOUR NAUSEA MEDICATION  *UNUSUAL SHORTNESS OF BREATH  *UNUSUAL BRUISING OR BLEEDING  TENDERNESS IN MOUTH AND THROAT WITH OR WITHOUT PRESENCE OF ULCERS  *URINARY PROBLEMS  *BOWEL PROBLEMS  UNUSUAL RASH Items with * indicate a potential emergency and should be followed up as soon as possible.  One of the nurses will contact you 24 hours after your treatment. Please let the nurse know about any problems that you may have experienced. Feel free to call the clinic you have any questions or concerns. The clinic phone number is [phone].   I have been informed and understand all the instructions given to me. I know to contact the clinic, my physician, or go to the Emergency Department if any problems should occur. I do not have any questions at this time, but understand that I may call the clinic during office hours   should I have any questions or need assistance in obtaining follow up care.    __________________________________________  _____________  __________ Signature of Patient or Authorized Representative            Date                   Time    __________________________________________ Nurse's Signature    

## 2011-08-02 NOTE — Progress Notes (Signed)
Hematology and Oncology Follow Up Visit  Abigail [name] [id number] [birth year] 56 y.o. 08/02/2011    HPI: Abigail [name] is a 56 year old [name] with recurrent fallopian tube/ovarian carcinoma for which she completed 2 cycles of carboplatin/Taxol, cycle 3 held for one week ago due to complicated medical history including 2 hospitalizations following each cycle of chemotherapy, due to initiate day 1, cycle 1 of every other week Doxil.  2. Newly diagnosed right jugular vein DVT course of  Arixtra 7.5 mg subcutaneous daily, along with Coumadin 10mg  daily.  3. Previous history of breast carcinoma, BCRA positive.  Interim History:   Abigail [name] is seen today in followup in anticipation of day 1, cycle 1 of every other week Doxil for recurrent fallopian tube/ovarian carcinoma. She continues on Arixtra 7.5 mg subcutaneous daily along with Coumadin 10 mg daily. She denies any bleeding or bruising symptoms. She denies any shortness of breath, chest pain, profound fatigue, no nausea, emesis, diarrhea or constipation issues. She denies any abdominal pain. She has no lingering significant neuropathy, but does note a little bit of numbness and tingling of the fingertips on her right hand which is chronic in nature. Energy level is actually quite good.  A detailed review of systems is otherwise noncontributory as noted below.  Review of Systems: Constitutional:  no weight loss, fever, night sweats, feels well and fatigued Eyes: no complaints ENT: no complaints Cardiovascular: positive for - shortness of breath Respiratory: as above. Neurological: no TIA or stroke symptoms Dermatological: negative Gastrointestinal: no abdominal pain, change in bowel habits, or black or bloody stools Genito-Urinary: no dysuria, trouble voiding, or hematuria Hematological and Lymphatic: negative Breast: negative Musculoskeletal: negative Remaining ROS negative.  Medications:   I have reviewed the  patient's current medications.  Current Outpatient Prescriptions  Medication Sig Dispense Refill  . amLODipine (NORVASC) 5 MG tablet Take 1 tablet (5 mg total) by mouth daily.  30 tablet  11  . Cholecalciferol (VITAMIN D3) 3000 UNITS TABS Take 3,000 Units by mouth daily.       Marland Kitchen lidocaine-prilocaine (EMLA) cream Apply topically as needed. For PAC  30 g  1  . lisinopril-hydrochlorothiazide (PRINZIDE,ZESTORETIC) 10-12.5 MG per tablet Take 1 tablet by mouth daily with breakfast.      . LORazepam (ATIVAN) 0.5 MG tablet Take 1 tablet (0.5 mg total) by mouth every 6 (six) hours as needed (Nausea or vomiting).  30 tablet  0  . ondansetron (ZOFRAN) 8 MG tablet Take 1 tab two times a day starting the day after chemo for 3 days. Then take 1 tab two times a day as needed for nausea or vomiting.  30 tablet  1  . prochlorperazine (COMPAZINE) 10 MG tablet Take 1 tablet (10 mg total) by mouth every 6 (six) hours as needed (Nausea or vomiting).  30 tablet  1  . warfarin (COUMADIN) 5 MG tablet Take 5 mg by mouth daily.      Marland Kitchen dexamethasone (DECADRON) 4 MG tablet Take 2 tablets by mouth daily starting the day after chemotherapy for 2 days. Take with food.  30 tablet  1  . LORazepam (ATIVAN) 0.5 MG tablet Take 1 tablet (0.5 mg total) by mouth every 6 (six) hours as needed (Nausea or vomiting).  30 tablet  0  . ondansetron (ZOFRAN) 8 MG tablet Take 1 tablet two times a day starting the day after chemo for 2 days. Then take 1 tablet two times a day as needed for nausea or vomiting.  30 tablet  1  . prochlorperazine (COMPAZINE) 10 MG tablet Take 1 tablet (10 mg total) by mouth every 6 (six) hours as needed (Nausea or vomiting).  30 tablet  1  . prochlorperazine (COMPAZINE) 25 MG suppository Place 1 suppository (25 mg total) rectally every 12 (twelve) hours as needed for nausea.  12 suppository  3   No current facility-administered medications for this visit.   Facility-Administered Medications Ordered in Other Visits    Medication Dose Route Frequency Provider Last Rate Last Dose  . fondaparinux (ARIXTRA) injection 7.5 mg  7.5 mg Subcutaneous Q24H Pierce Crane, MD   7.5 mg at 07/17/11 1327    Allergies:  Allergies  Allergen Reactions  . Codeine Itching  . Oxycodone Nausea Only  . Vicodin (Hydrocodone-Acetaminophen) Nausea Only    Physical Exam: Filed Vitals:   08/02/11 1142  BP: 139/88  Pulse: 119  Temp: 98.4 F (36.9 C)    Body mass index is 38.64 kg/(m^2). Weight: 204 lbs. HEENT:  Sclerae anicteric, conjunctivae pink.  Oropharynx clear.  No mucositis or candidiasis.  No evidence of  frank distention over right jugular vein region. Nodes:  No cervical, supraclavicular, or axillary lymphadenopathy palpated.  Breast Exam:  Deferred.  Lungs:  Clear to auscultation bilaterally.  No crackles, rhonchi, or wheezes.   Heart:  Tachycardic rate, but normal rhythm.   Abdomen:  Soft, nontender.  Positive bowel sounds.  No organomegaly or masses palpated.   Musculoskeletal:  No focal spinal tenderness to palpation.  Extremities:  Benign.  She does have evidence of bilateral edema, but no cyanosis.   Skin:  Benign.   Neuro:  Nonfocal, alert and oriented x 3.   Lab Results: Lab Results  Component Value Date   WBC 7.0 08/02/2011   HGB 8.0* 08/02/2011   HCT 24.4* 08/02/2011   MCV 96.4 08/02/2011   PLT 299 08/02/2011   NEUTROABS 4.8 08/02/2011     Chemistry      Component Value Date/Time   NA 134* 07/30/2011 0838   K 3.4* 07/30/2011 0838   CL 99 07/30/2011 0838   CO2 26 07/30/2011 0838   BUN 10 07/30/2011 0838   CREATININE 0.76 07/30/2011 0838      Component Value Date/Time   CALCIUM 9.0 07/30/2011 0838   ALKPHOS 84 07/30/2011 0838   AST 19 07/30/2011 0838   ALT 13 07/30/2011 0838   BILITOT 0.4 07/30/2011 0838      Lab Results  Component Value Date   LABCA2 63* 05/04/2011     Assessment:  Abigail [name] is a 56 year old [name] with recurrent fallopian tube/ovarian carcinoma  for which she completed 2 cycles of carboplatin/Taxol, cycle 3 held for one week ago due to complicated medical history including 2 hospitalizations following each cycle of chemotherapy.  Due to initiate day 1 cycle 1 of every two week Doxil today  2. Newly diagnosed right jugular vein DVT course of  Arixtra 7.5 mg subcutaneous daily, on Coumadin 10mg  daily, now with therapeutic INR.  3. Moderate anemia, though the patient is asymptomatic.  3. Previous history of breast carcinoma, BCRA positive.  Case reviewed with Dr. Pierce Crane.   Plan:  Abigail [name] will receive her first cycle of every 2 week Doxil today as scheduled, despite the fact her 2-D echocardiogram has not been performed. I will get this ordered though it should be noted she sees a cardiologist on 08/16/2011.  We will provided her information pertaining to Aranesp injections, which will be used when indicated.  She will discontinue Arixtra injections, continue on Coumadin, though at a 7.5 mg dose with PT/INR being checked along with his CBC in one week's time during her nadir followup. This plan was reviewed with the patient, who voices understanding and agreement.  She knows to call with any changes or problems.    Millee Denise T, PA-C 08/02/2011

## 2011-08-03 ENCOUNTER — Telehealth: Payer: Self-pay | Admitting: *Deleted

## 2011-08-03 ENCOUNTER — Other Ambulatory Visit: Payer: Self-pay | Admitting: *Deleted

## 2011-08-03 MED ORDER — POTASSIUM CHLORIDE CRYS ER 20 MEQ PO TBCR: 20.0000 meq | EXTENDED_RELEASE_TABLET | Freq: Every day | ORAL | Status: DC

## 2011-08-03 NOTE — Telephone Encounter (Signed)
Called pt to discuss chemo f/u post doxil.  Pt reports that she had some nausea this am but took her nausea med & felt better.  She reports no other problems & reminded to call if she had questions or concerns or other side effects.

## 2011-08-10 ENCOUNTER — Telehealth: Payer: Self-pay | Admitting: Oncology

## 2011-08-10 ENCOUNTER — Other Ambulatory Visit (HOSPITAL_BASED_OUTPATIENT_CLINIC_OR_DEPARTMENT_OTHER): Payer: Medicare Other | Admitting: Lab

## 2011-08-10 ENCOUNTER — Encounter: Payer: Self-pay | Admitting: Physician Assistant

## 2011-08-10 ENCOUNTER — Ambulatory Visit (HOSPITAL_BASED_OUTPATIENT_CLINIC_OR_DEPARTMENT_OTHER): Payer: Medicare Other | Admitting: Physician Assistant

## 2011-08-10 VITALS — BP 139/92 | HR 103 | Temp 99.0°F | Ht 61.0 in | Wt 205.1 lb

## 2011-08-10 DIAGNOSIS — C5700 Malignant neoplasm of unspecified fallopian tube: Secondary | ICD-10-CM

## 2011-08-10 DIAGNOSIS — I82C19 Acute embolism and thrombosis of unspecified internal jugular vein: Secondary | ICD-10-CM

## 2011-08-10 DIAGNOSIS — I82C29 Chronic embolism and thrombosis of unspecified internal jugular vein: Secondary | ICD-10-CM

## 2011-08-10 DIAGNOSIS — Z1501 Genetic susceptibility to malignant neoplasm of breast: Secondary | ICD-10-CM

## 2011-08-10 DIAGNOSIS — O2230 Deep phlebothrombosis in pregnancy, unspecified trimester: Secondary | ICD-10-CM

## 2011-08-10 DIAGNOSIS — I82409 Acute embolism and thrombosis of unspecified deep veins of unspecified lower extremity: Secondary | ICD-10-CM

## 2011-08-10 DIAGNOSIS — D649 Anemia, unspecified: Secondary | ICD-10-CM

## 2011-08-10 LAB — PROTIME-INR
INR: 2.3 (ref 2.00–3.50)
Protime: 27.6 Seconds — ABNORMAL HIGH (ref 10.6–13.4)

## 2011-08-10 NOTE — Progress Notes (Signed)
Hematology and Oncology Follow Up Visit  Abigail [name] [id number] Jan 22, 1956 56 y.o. 08/10/2011    HPI: Abigail [name] is a 56 year old [name] with recurrent fallopian tube/ovarian carcinoma for which she completed 2 cycles of carboplatin/Taxol, cycle 3 held for one week ago due to complicated medical history including 2 hospitalizations following each cycle of chemotherapy, currently day 7, cycle 1 of every other week Doxil.  2. Newly diagnosed right jugular vein DVT course of  Arixtra 7.5 mg subcutaneous daily, along with Coumadin 10mg  daily.  3. Previous history of breast carcinoma, BCRA positive.  Interim History:   Abigail [name] is seen today in followup in anticipation after her first cycle  of every other week Doxil for recurrent fallopian tube/ovarian carcinoma. She continues Coumadin 7.5mg  daily. She denies any bleeding or bruising symptoms. She denies any shortness of breath, chest pain, profound fatigue, no nausea, emesis, diarrhea or constipation issues. She denies any abdominal pain. She has no lingering significant neuropathy, but does note a little bit of numbness and tingling of the fingertips on her right hand which is chronic in nature. Energy level is actually quite good, and overall she feels quite well. A detailed review of systems is otherwise noncontributory as noted below.  Review of Systems: Constitutional:  no weight loss, fever, night sweats, feels well and fatigued Eyes: no complaints ENT: no complaints Cardiovascular: positive for - shortness of breath Respiratory: as above. Neurological: no TIA or stroke symptoms Dermatological: negative Gastrointestinal: no abdominal pain, change in bowel habits, or black or bloody stools Genito-Urinary: no dysuria, trouble voiding, or hematuria Hematological and Lymphatic: negative Breast: negative Musculoskeletal: negative Remaining ROS negative.  Medications:   I have reviewed the patient's current  medications.  Current Outpatient Prescriptions  Medication Sig Dispense Refill  . amLODipine (NORVASC) 5 MG tablet Take 1 tablet (5 mg total) by mouth daily.  30 tablet  11  . Cholecalciferol (VITAMIN D3) 3000 UNITS TABS Take 3,000 Units by mouth daily.       Marland Kitchen dexamethasone (DECADRON) 4 MG tablet Take 2 tablets by mouth daily starting the day after chemotherapy for 2 days. Take with food.  30 tablet  1  . lidocaine-prilocaine (EMLA) cream Apply topically as needed. For PAC  30 g  1  . lisinopril-hydrochlorothiazide (PRINZIDE,ZESTORETIC) 10-12.5 MG per tablet Take 1 tablet by mouth daily with breakfast.      . LORazepam (ATIVAN) 0.5 MG tablet Take 1 tablet (0.5 mg total) by mouth every 6 (six) hours as needed (Nausea or vomiting).  30 tablet  0  . LORazepam (ATIVAN) 0.5 MG tablet Take 1 tablet (0.5 mg total) by mouth every 6 (six) hours as needed (Nausea or vomiting).  30 tablet  0  . ondansetron (ZOFRAN) 8 MG tablet Take 1 tab two times a day starting the day after chemo for 3 days. Then take 1 tab two times a day as needed for nausea or vomiting.  30 tablet  1  . ondansetron (ZOFRAN) 8 MG tablet Take 1 tablet two times a day starting the day after chemo for 2 days. Then take 1 tablet two times a day as needed for nausea or vomiting.  30 tablet  1  . potassium chloride SA (K-DUR,KLOR-CON) 20 MEQ tablet Take 1 tablet (20 mEq total) by mouth daily.  14 tablet  0  . prochlorperazine (COMPAZINE) 10 MG tablet Take 1 tablet (10 mg total) by mouth every 6 (six) hours as needed (Nausea or vomiting).  30 tablet  1  . prochlorperazine (COMPAZINE) 10 MG tablet Take 1 tablet (10 mg total) by mouth every 6 (six) hours as needed (Nausea or vomiting).  30 tablet  1  . prochlorperazine (COMPAZINE) 25 MG suppository Place 1 suppository (25 mg total) rectally every 12 (twelve) hours as needed for nausea.  12 suppository  3  . warfarin (COUMADIN) 5 MG tablet Take 5 mg by mouth daily.       No current  facility-administered medications for this visit.   Facility-Administered Medications Ordered in Other Visits  Medication Dose Route Frequency Provider Last Rate Last Dose  . fondaparinux (ARIXTRA) injection 7.5 mg  7.5 mg Subcutaneous Q24H Pierce Crane, MD   7.5 mg at 07/17/11 1327    Allergies:  Allergies  Allergen Reactions  . Codeine Itching  . Oxycodone Nausea Only  . Vicodin (Hydrocodone-Acetaminophen) Nausea Only    Physical Exam: Filed Vitals:   08/10/11 1342  BP: 139/92  Pulse: 103  Temp: 99 F (37.2 C)    Body mass index is 38.75 kg/(m^2). Weight: 205 lbs. HEENT:  Sclerae anicteric, conjunctivae pink.  Oropharynx clear.  No mucositis or candidiasis.  No evidence of  frank distention over right jugular vein region. Nodes:  No cervical, supraclavicular, or axillary lymphadenopathy palpated.  Breast Exam:  Deferred.  Lungs:  Clear to auscultation bilaterally.  No crackles, rhonchi, or wheezes.   Heart:  Tachycardic rate, but normal rhythm.   Abdomen:  Soft, nontender.  Positive bowel sounds.  No organomegaly or masses palpated.   Musculoskeletal:  No focal spinal tenderness to palpation.  Extremities:  Benign.  She does have evidence of bilateral edema, but no cyanosis.   Skin:  Benign.   Neuro:  Nonfocal, alert and oriented x 3.   Lab Results: Lab Results  Component Value Date   WBC 7.0 08/02/2011   HGB 8.0* 08/02/2011   HCT 24.4* 08/02/2011   MCV 96.4 08/02/2011   PLT 299 08/02/2011   NEUTROABS 4.8 08/02/2011     Chemistry      Component Value Date/Time   NA 136 08/02/2011 1127   K 2.9* 08/02/2011 1127   CL 99 08/02/2011 1127   CO2 25 08/02/2011 1127   BUN 7 08/02/2011 1127   CREATININE 0.71 08/02/2011 1127      Component Value Date/Time   CALCIUM 9.4 08/02/2011 1127   ALKPHOS 84 07/30/2011 0838   AST 19 07/30/2011 0838   ALT 13 07/30/2011 0838   BILITOT 0.4 07/30/2011 0838      Lab Results  Component Value Date   LABCA2 63* 05/04/2011     Assessment:    [name] is a 56 year old [name] with recurrent fallopian tube/ovarian carcinoma for which she completed 2 cycles of carboplatin/Taxol, cycle 3 held for one week ago due to complicated medical history including 2 hospitalizations following each cycle of chemotherapy.  Currently day 7, cycle 1 of every two week Doxil today  2. Newly diagnosed right jugular vein DVT, initially on  Arixtra 7.5 mg subcutaneous daily, now on Coumadin 7.5mg  with therapeutic INR.  3. Moderate anemia, though the patient is asymptomatic.  3. Previous history of breast carcinoma, BCRA positive.  Case reviewed with Dr. Pierce Crane.   Plan:  Abigail [name] will return on 08/17/11 for follow up prior to day 1  cycle 2 of every 2 week Doxil.  I will schedule a 2D echocardiogram prior to her consultation with the cardiologist.  We will review the information pertaining to  Aranesp injections, which will be used when indicated. She will  continue on Coumadin, 7.5 mg dose with PT/INR being checked along with a repeat CBC in one week's time during her followup. This plan was reviewed with the patient, who voices understanding and agreement.  She knows to call with any changes or problems.   Nathyn Luiz T, PA-C 08/10/2011

## 2011-08-10 NOTE — Telephone Encounter (Signed)
No new orders. Scheduled echo for 7/8 @ [facility] @ 9 am. Pt also given July schedule and is aware echo is @ [facility] and she is to go to the main building and reg in the 1st floor admitting dept.

## 2011-08-13 ENCOUNTER — Ambulatory Visit (HOSPITAL_COMMUNITY)
Admission: RE | Admit: 2011-08-13 | Discharge: 2011-08-13 | Disposition: A | Discharge status: Home / Self Care | Payer: Medicare Other | Source: Ambulatory Visit | Attending: Oncology | Admitting: Oncology

## 2011-08-13 DIAGNOSIS — I359 Nonrheumatic aortic valve disorder, unspecified: Secondary | ICD-10-CM | POA: Insufficient documentation

## 2011-08-13 DIAGNOSIS — R0609 Other forms of dyspnea: Secondary | ICD-10-CM | POA: Insufficient documentation

## 2011-08-13 DIAGNOSIS — C50919 Malignant neoplasm of unspecified site of unspecified female breast: Secondary | ICD-10-CM | POA: Insufficient documentation

## 2011-08-13 DIAGNOSIS — I82409 Acute embolism and thrombosis of unspecified deep veins of unspecified lower extremity: Secondary | ICD-10-CM

## 2011-08-13 DIAGNOSIS — Z1501 Genetic susceptibility to malignant neoplasm of breast: Secondary | ICD-10-CM

## 2011-08-13 DIAGNOSIS — I517 Cardiomegaly: Secondary | ICD-10-CM | POA: Insufficient documentation

## 2011-08-13 DIAGNOSIS — Z1509 Genetic susceptibility to other malignant neoplasm: Secondary | ICD-10-CM

## 2011-08-13 DIAGNOSIS — R0989 Other specified symptoms and signs involving the circulatory and respiratory systems: Secondary | ICD-10-CM | POA: Insufficient documentation

## 2011-08-13 DIAGNOSIS — R0600 Dyspnea, unspecified: Secondary | ICD-10-CM

## 2011-08-13 NOTE — Progress Notes (Signed)
  Echocardiogram 2D Echocardiogram has been performed.  Abigail [name] 08/13/2011, 10:19 AM

## 2011-08-14 ENCOUNTER — Ambulatory Visit (INDEPENDENT_AMBULATORY_CARE_PROVIDER_SITE_OTHER): Payer: Medicare Other | Admitting: Internal Medicine

## 2011-08-14 ENCOUNTER — Ambulatory Visit (INDEPENDENT_AMBULATORY_CARE_PROVIDER_SITE_OTHER): Payer: Medicare Other | Admitting: Cardiology

## 2011-08-14 ENCOUNTER — Encounter (INDEPENDENT_AMBULATORY_CARE_PROVIDER_SITE_OTHER): Payer: Medicare Other

## 2011-08-14 ENCOUNTER — Encounter: Payer: Self-pay | Admitting: Cardiology

## 2011-08-14 ENCOUNTER — Encounter: Payer: Self-pay | Admitting: Internal Medicine

## 2011-08-14 VITALS — BP 104/80 | HR 123 | Temp 98.3°F | Ht 61.0 in | Wt 204.0 lb

## 2011-08-14 VITALS — BP 120/90 | HR 108 | Ht 61.0 in | Wt 204.8 lb

## 2011-08-14 DIAGNOSIS — R000 Tachycardia, unspecified: Secondary | ICD-10-CM

## 2011-08-14 DIAGNOSIS — I498 Other specified cardiac arrhythmias: Secondary | ICD-10-CM

## 2011-08-14 DIAGNOSIS — J8410 Pulmonary fibrosis, unspecified: Secondary | ICD-10-CM

## 2011-08-14 DIAGNOSIS — R0989 Other specified symptoms and signs involving the circulatory and respiratory systems: Secondary | ICD-10-CM

## 2011-08-14 DIAGNOSIS — R0609 Other forms of dyspnea: Secondary | ICD-10-CM

## 2011-08-14 DIAGNOSIS — R0600 Dyspnea, unspecified: Secondary | ICD-10-CM

## 2011-08-14 DIAGNOSIS — R059 Cough, unspecified: Secondary | ICD-10-CM

## 2011-08-14 DIAGNOSIS — R0602 Shortness of breath: Secondary | ICD-10-CM

## 2011-08-14 DIAGNOSIS — R05 Cough: Secondary | ICD-10-CM

## 2011-08-14 DIAGNOSIS — J849 Interstitial pulmonary disease, unspecified: Secondary | ICD-10-CM

## 2011-08-14 DIAGNOSIS — R002 Palpitations: Secondary | ICD-10-CM

## 2011-08-14 LAB — PULMONARY FUNCTION TEST

## 2011-08-14 NOTE — Patient Instructions (Addendum)
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

## 2011-08-14 NOTE — Progress Notes (Signed)
Subjective:    Patient ID: Abigail [name], female    DOB: 05-Aug-1955, 56 y.o.   MRN: [id number]  HPI 56 year female. PCP is Carollee Herter, MD and Onclogist is Dr Pierce Crane Body mass index is 39.72 kg/(m^2).  reports that she quit smoking about 4 years ago. Her smoking use included Cigarettes. She has a 3.5 pack-year smoking history. She has never used smokeless tobacco. 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 hx of BRCA abnormality and previous history of breast and ovarian Cancer, treated ~ 2 y ago, now with recurrent ovarian cancer - Rx with carboplatin and taxol - cycle #07 May 2011 and cycle #07 Jun 2011  IOV 07/18/11  She has had recurrent fallopian tube cancer . She was admitted 06/12/11 - 06/18/11 with febrile neturopenia following cycle 1 of chemo. REsolved with antibiotics and neupogen and discharged. Subseuqently underwent cycle 2 chemo and in aftermath of that, 2 weeks later,  admitted 07/08/11 through 07/13/11 with  Fever, progressive weakness, fatigue and shortness of breath, temperature 100.5 at home, and was found to have leukocytosis of 14,000 in CBC and plat count 84K. Chest x-ray did not show any acute infiltrate, and appears cultures negative. Issues this admission were a) fever - unclear cause v  atypical pna v neuopen and temp 2F at discharge with one week levaquin at dc; b) tachycardia - PE ruled out by CT angio and c) electrolyte and cbc issues; got transfused and d) CT chest showing non-specific ILD with request to see pulmonary as outpatient.   Post discharge noted by home care to have low grade feber 99.5 - 100.5 at home along with resting tachycardia. Then saw Dr Donnie Coffin 07/17/11 and noted temp 2F and HR 133 in office.  Carboplatin toxicity considered.  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   Today , 07/18/11 presents to pulmonary for clarificaiton of CT  chest finidings but she has no idea about that. Instead her complaints are about dyspnea that is of insidious onset, chronic, stable, class 2-3 exertional activities and associated with fatigue. Dyspnea rated as moderate and does improve with rest. In addition, her low grade fever continue despite the levaquin. Also, reporting dry cough x 1 week Not sure anti-coagulation started for DVT neck but med list shows ARIXTRA since 6/11 by DR Donnie Coffin and unclear if helping fevers yet.  Walking  In office today  - walked only 185 feet x 2 laps and stopped due to dizziness, dyspnea. 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 - seems unchanged since 2009 according to my review  LABS  Lab 07/17/11 0953 07/13/11 1240 07/12/11 0400  HGB 8.3* 8.1* 8.1*  HCT 24.2* 23.6* 23.7*  WBC 9.9 10.6* 11.5*  PLT 105* 64* 54*     #Cough  - please stop lisinopril medication  - Ask Dr Debbe Mounts to switch you to another medication like an ARB or calcium channel blocker (please show this sheet to him); we will call his office as well  # Fast heart rate  - please see [facility] < 1 week  # Findings on CT chest  - 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 Still tachycardic. HR today at rest 123/min. ECHO 08/13/11 - lvef 65%. Due to see Dr Antoine Poche of cards today. 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 PFTs are c/w this PFT 08/13/11: FEv1 2L/77%, FVC 1.95L/70%, small airways - restriction,   Rati0 81, TLC 3.5L/82%, Unadj DLCO 11/48%  But DLCO with hgb correction for 8gm% is 14.3/61%. Walk test June 2013: walked only 185 feet x 2 laps and stopped due to dizziness, dyspnea. Did not desaturate but HR rose to 155/min. Note hgb 8gm% on on 08/02/11 and 08/14/2011 bMI is Body mass index is 38.55 kg/(m^2).      Past, Family, Social reviewed: Has completed cycle #3 chemo. Due for cyccle #4 Friday 08/17/11   Review of Systems  Constitutional: Negative for fever and unexpected weight change.  HENT: Negative for ear pain, nosebleeds, congestion, sore throat, rhinorrhea, sneezing, trouble swallowing, dental problem, postnasal drip and sinus pressure.   Eyes: Negative for redness and itching.  Respiratory: Negative for cough, chest tightness, shortness of breath and wheezing.   Cardiovascular: Negative for palpitations and leg swelling.  Gastrointestinal: Negative for nausea and vomiting.  Genitourinary: Negative for dysuria.  Musculoskeletal: Negative for joint swelling.  Skin: Negative for rash.  Neurological: Negative for headaches.  Hematological: Does not bruise/bleed easily.  Psychiatric/Behavioral: Negative for dysphoric mood. The patient is not nervous/anxious.        Objective:   Physical Exam Vitals reviewed. Constitutional: She is oriented to person, place, and time. She appears well-developed and well-nourished. No distress.       Obese Body mass index is 38.55 kg/(m^2).  Chronic unwell looking but looking much better HENT:  Head: Normocephalic and atraumatic.  Right Ear: External ear normal.  Left Ear: External ear normal.  Mouth/Throat: Oropharynx is clear and moist. No oropharyngeal exudate.  Eyes: Conjunctivae and EOM are normal. Pupils are equal, round, and reactive to light. Right eye exhibits no discharge. Left eye exhibits no discharge. No scleral icterus.  Neck: Normal range of motion. Neck supple. No JVD present. No tracheal deviation present. No thyromegaly  present.       Right neck indurated but no longer tender  Cardiovascular: Normal rate, regular rhythm, normal heart sounds and intact distal pulses.  Exam reveals no gallop and no friction rub.   No murmur heard. Pulmonary/Chest: Effort normal and breath sounds normal. No respiratory distress. She has no wheezes. She has no rales. She exhibits no tenderness.  Abdominal: Soft. Bowel sounds are normal. She exhibits no distension and no mass. There is no tenderness. There is no rebound and no guarding.       obese  Musculoskeletal: Normal range of motion. She exhibits no edema and no tenderness.       Has cane  Lymphadenopathy:    She has no cervical adenopathy.  Neurological: She is alert and oriented to person, place, and time. She has normal reflexes. No cranial nerve deficit. She exhibits normal muscle tone. Coordination normal.  Skin: Skin is warm and dry. No rash noted. She is not diaphoretic. No erythema. No pallor.  Psychiatric: She has a normal mood and affect. Her behavior is normal. Judgment and thought content normal.  Pleasant and better historian         Assessment & Plan:

## 2011-08-14 NOTE — Assessment & Plan Note (Signed)
I will check an echocardiogram to look for diurnal variation and automatic dysrhythmias.

## 2011-08-14 NOTE — Patient Instructions (Addendum)
The current medical regimen is effective;  continue present plan and medications.  Please have blood work today (BNP).  Your physician has recommended that you wear a holter monitor for 24 hours. Holter monitors are medical devices that record the heart's electrical activity. Doctors most often use these monitors to diagnose arrhythmias. Arrhythmias are problems with the speed or rhythm of the heartbeat. The monitor is a small, portable device. You can wear one while you do your normal daily activities. This is usually used to diagnose what is causing palpitations/syncope (passing out).  Your physician has requested that you have a lexiscan myoview. For further information please visit [email or web address] Please follow instruction sheet, as given.

## 2011-08-14 NOTE — Progress Notes (Signed)
HPI The patient presents as a new patient for evaluation of dyspnea and tachycardia. She is unfortunately had a complicated course with a history of breast cancer. She now has fallopian/ovarian cancer per my review of the chart. She's undergoing chemotherapy for this and has had carboplatin and Taxol. She was under the impression that she had stomach cancer though this is clearly not indicated in the chart. She has been seen by Dr. Marchelle Gearing for workup of dyspnea. She's been getting short of breath with mild activity such as walking a few feet on level ground area she's been sleeping on 3 pillows. She's not had weight gain and only trace lower extremity edema. Workup has included pulmonary function tests which are pending. She's had a CT of her chest which demonstrated some mild interstitial lung disease with scarring but no overt acute abnormalities. She has had an echocardiogram which demonstrated well-preserved ejection fraction no pericardial effusion and no significant abnormalities. She has had noted tachycardia with minimal exertion. This we've reproduced in the office today by walking her. Her saturation was okay though she was notably dyspneic. Her heart rate increased to the 140s with minimal exertion. She's not had any chest pressure, neck or arm discomfort. She's not had any presyncope or syncope. Of note she did have an atypical pneumonia recently. She's also been found to have a recent acute jugular vein thrombosis.   Allergies  Allergen Reactions  . Codeine Itching  . Oxycodone Nausea Only  . Vicodin (Hydrocodone-Acetaminophen) Nausea Only    Current Outpatient Prescriptions  Medication Sig Dispense Refill  . amLODipine (NORVASC) 5 MG tablet Take 1 tablet (5 mg total) by mouth daily.  30 tablet  11  . Cholecalciferol (VITAMIN D3) 3000 UNITS TABS Take 3,000 Units by mouth daily.       Marland Kitchen dexamethasone (DECADRON) 4 MG tablet Take 2 tablets by mouth daily starting the day after  chemotherapy for 2 days. Take with food.  30 tablet  1  . lidocaine-prilocaine (EMLA) cream Apply topically as needed. For PAC  30 g  1  . lisinopril-hydrochlorothiazide (PRINZIDE,ZESTORETIC) 10-12.5 MG per tablet Take 1 tablet by mouth daily.      Marland Kitchen LORazepam (ATIVAN) 0.5 MG tablet Take 1 tablet (0.5 mg total) by mouth every 6 (six) hours as needed (Nausea or vomiting).  30 tablet  0  . ondansetron (ZOFRAN) 8 MG tablet Take 1 tablet two times a day starting the day after chemo for 2 days. Then take 1 tablet two times a day as needed for nausea or vomiting.  30 tablet  1  . potassium chloride SA (K-DUR,KLOR-CON) 20 MEQ tablet Take 1 tablet (20 mEq total) by mouth daily.  14 tablet  0  . prochlorperazine (COMPAZINE) 10 MG tablet Take 1 tablet (10 mg total) by mouth every 6 (six) hours as needed (Nausea or vomiting).  30 tablet  1  . prochlorperazine (COMPAZINE) 25 MG suppository Place 25 mg rectally every 12 (twelve) hours as needed.      . warfarin (COUMADIN) 5 MG tablet Take 7.5 mg by mouth daily.       Marland Kitchen DISCONTD: prochlorperazine (COMPAZINE) 25 MG suppository Place 1 suppository (25 mg total) rectally every 12 (twelve) hours as needed for nausea.  12 suppository  3   No current facility-administered medications for this visit.   Facility-Administered Medications Ordered in Other Visits  Medication Dose Route Frequency Provider Last Rate Last Dose  . fondaparinux (ARIXTRA) injection 7.5 mg  7.5 mg  Subcutaneous Q24H Pierce Crane, MD   7.5 mg at 07/17/11 1327    Past Medical History  Diagnosis Date  . Hypertension   . Obesity   . Diverticulitis   . Hemorrhoids   . Colonic polyp   . GERD (gastroesophageal reflux disease)   . Cancer     BREAST, FALLOPIAN TUBE,  . Blood clot of neck vein     Past Surgical History  Procedure Date  . Tonsillectomy and adenoidectomy   . Breast lumpectomy   . Abdominal hysterectomy     TAH, BSO  . Port a cath     05/29/11    Family History  Problem  Relation Age of Onset  . Cancer Mother   . Hypertension Mother   . Diabetes Father   . Heart disease Father 42  . Hypertension Father   . Stroke Father   . Hypertension Sister   . Hypertension Brother   . Hypertension Paternal Aunt   . Diabetes Paternal Aunt   . Hypertension Paternal Uncle   . Diabetes Paternal Uncle   . Hypertension Paternal Grandmother   . Hypertension Paternal Grandfather     History   Social History  . Marital Status: Divorced    Spouse Name: N/A    Number of Children: N/A  . Years of Education: N/A   Occupational History  . Disabled    Social History Main Topics  . Smoking status: Former Smoker -- 0.5 packs/day for 7 years    Types: Cigarettes    Quit date: 08/07/2006  . Smokeless tobacco: Never Used  . Alcohol Use: No     occas  . Drug Use: Not on file  . Sexually Active: No   Other Topics Concern  . Not on file   Social History Narrative   Lives alone.  Three children.      ROS:  As stated in the HPI and negative for all other systems.  PHYSICAL EXAM BP 120/90  Pulse 108  Ht 5\' 1"  (1.549 m)  Wt 204 lb 12.8 oz (92.897 kg)  BMI 38.70 kg/m2 GENERAL:  Well appearing HEENT:  Pupils equal round and reactive, fundi not visualized, oral mucosa unremarkable NECK:  No jugular venous distention, waveform within normal limits, carotid upstroke brisk and symmetric, no bruits, no thyromegaly LYMPHATICS:  No cervical, inguinal adenopathy LUNGS:  Clear to auscultation bilaterally BACK:  No CVA tenderness CHEST:  Unremarkable HEART:  PMI not displaced or sustained,S1 and S2 within normal limits, no S3, no S4, no clicks, no rubs, no murmurs ABD:  Flat, positive bowel sounds normal in frequency in pitch, no bruits, no rebound, no guarding, no midline pulsatile mass, no hepatomegaly, no splenomegaly EXT:  2 plus pulses throughout, no edema, no cyanosis no clubbing SKIN:  No rashes no nodules NEURO:  Cranial nerves II through XII grossly intact,  motor grossly intact throughout PSYCH:  Cognitively intact, oriented to person place and time   EKG: Sinus tachycardia, rate 130, premature atrial contraction, poor anterior R wave progression, left axis deviation.   ASSESSMENT AND PLAN

## 2011-08-14 NOTE — Assessment & Plan Note (Signed)
Etiology of this is not clear. Pulmonary function test results are pending. I am going to order an ischemia workup with Lexiscan Myoview.  I will also check a BNP level.

## 2011-08-14 NOTE — Progress Notes (Signed)
PFT done today. 

## 2011-08-16 ENCOUNTER — Ambulatory Visit: Payer: Medicare Other | Admitting: Physician Assistant

## 2011-08-16 ENCOUNTER — Institutional Professional Consult (permissible substitution): Payer: Medicare Other | Admitting: Cardiology

## 2011-08-16 ENCOUNTER — Telehealth: Payer: Self-pay

## 2011-08-16 ENCOUNTER — Other Ambulatory Visit: Payer: Medicare Other | Admitting: Lab

## 2011-08-16 NOTE — Telephone Encounter (Signed)
24 hr E Cardio monitor put on patient 08/14/11.Patient to return monitor 08/15/11.

## 2011-08-17 ENCOUNTER — Ambulatory Visit (HOSPITAL_BASED_OUTPATIENT_CLINIC_OR_DEPARTMENT_OTHER): Payer: Medicare Other | Admitting: Physician Assistant

## 2011-08-17 ENCOUNTER — Encounter: Payer: Self-pay | Admitting: Internal Medicine

## 2011-08-17 ENCOUNTER — Encounter: Payer: Self-pay | Admitting: Physician Assistant

## 2011-08-17 ENCOUNTER — Other Ambulatory Visit (HOSPITAL_BASED_OUTPATIENT_CLINIC_OR_DEPARTMENT_OTHER): Payer: Medicare Other | Admitting: Lab

## 2011-08-17 ENCOUNTER — Ambulatory Visit (HOSPITAL_BASED_OUTPATIENT_CLINIC_OR_DEPARTMENT_OTHER): Payer: Medicare Other

## 2011-08-17 VITALS — BP 137/88 | HR 108 | Temp 98.9°F | Ht 61.0 in | Wt 206.3 lb

## 2011-08-17 DIAGNOSIS — C5700 Malignant neoplasm of unspecified fallopian tube: Secondary | ICD-10-CM

## 2011-08-17 DIAGNOSIS — I82409 Acute embolism and thrombosis of unspecified deep veins of unspecified lower extremity: Secondary | ICD-10-CM

## 2011-08-17 DIAGNOSIS — Z1501 Genetic susceptibility to malignant neoplasm of breast: Secondary | ICD-10-CM

## 2011-08-17 DIAGNOSIS — I82C19 Acute embolism and thrombosis of unspecified internal jugular vein: Secondary | ICD-10-CM

## 2011-08-17 DIAGNOSIS — Z5111 Encounter for antineoplastic chemotherapy: Secondary | ICD-10-CM

## 2011-08-17 DIAGNOSIS — D649 Anemia, unspecified: Secondary | ICD-10-CM

## 2011-08-17 LAB — COMPREHENSIVE METABOLIC PANEL
AST: 14 U/L (ref 0–37)
BUN: 8 mg/dL (ref 6–23)
CO2: 25 mEq/L (ref 19–32)
Calcium: 9.3 mg/dL (ref 8.4–10.5)
Chloride: 102 mEq/L (ref 96–112)
Creatinine, Ser: 0.71 mg/dL (ref 0.50–1.10)
Total Bilirubin: 0.4 mg/dL (ref 0.3–1.2)

## 2011-08-17 LAB — CBC WITH DIFFERENTIAL/PLATELET
BASO%: 0.1 % (ref 0.0–2.0)
HCT: 27.4 % — ABNORMAL LOW (ref 34.8–46.6)
LYMPH%: 19.4 % (ref 14.0–49.7)
MCHC: 33.2 g/dL (ref 31.5–36.0)
MCV: 96.5 fL (ref 79.5–101.0)
MONO#: 0.6 10*3/uL (ref 0.1–0.9)
MONO%: 7.7 % (ref 0.0–14.0)
NEUT%: 71.8 % (ref 38.4–76.8)
Platelets: 275 10*3/uL (ref 145–400)
WBC: 7.1 10*3/uL (ref 3.9–10.3)

## 2011-08-17 LAB — LACTATE DEHYDROGENASE: LDH: 148 U/L (ref 94–250)

## 2011-08-17 LAB — PROTIME-INR: INR: 1.3 — ABNORMAL LOW (ref 2.00–3.50)

## 2011-08-17 MED ORDER — DOXORUBICIN HCL LIPOSOMAL CHEMO INJECTION 2 MG/ML
30.0000 mg/m2 | Freq: Once | INTRAVENOUS | Status: AC
  Administered 2011-08-17: 60 mg via INTRAVENOUS
  Filled 2011-08-17: qty 30

## 2011-08-17 MED ORDER — SODIUM CHLORIDE 0.9 % IJ SOLN
10.0000 mL | INTRAMUSCULAR | Status: DC | PRN
  Administered 2011-08-17: 10 mL
  Filled 2011-08-17: qty 10

## 2011-08-17 MED ORDER — HEPARIN SOD (PORK) LOCK FLUSH 100 UNIT/ML IV SOLN
500.0000 [IU] | Freq: Once | INTRAVENOUS | Status: AC | PRN
  Administered 2011-08-17: 500 [IU]
  Filled 2011-08-17: qty 5

## 2011-08-17 MED ORDER — ONDANSETRON 8 MG/50ML IVPB (CHCC)
8.0000 mg | Freq: Once | INTRAVENOUS | Status: AC
  Administered 2011-08-17: 8 mg via INTRAVENOUS

## 2011-08-17 MED ORDER — WARFARIN SODIUM 5 MG PO TABS: 5.0000 mg | ORAL_TABLET | Freq: Every day | ORAL | Status: DC

## 2011-08-17 MED ORDER — SODIUM CHLORIDE 0.9 % IV SOLN
Freq: Once | INTRAVENOUS | Status: AC
  Administered 2011-08-17: 11:00:00 via INTRAVENOUS

## 2011-08-17 MED ORDER — DEXAMETHASONE SODIUM PHOSPHATE 10 MG/ML IJ SOLN
10.0000 mg | Freq: Once | INTRAMUSCULAR | Status: AC
  Administered 2011-08-17: 10 mg via INTRAVENOUS

## 2011-08-17 NOTE — Assessment & Plan Note (Signed)
#  cough  - this was from ace inhibitor drug; will add this to allergy list  - glad this is gone iwht medication switch

## 2011-08-17 NOTE — Assessment & Plan Note (Signed)
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 In any event it appears stable since 2009 and is probably not a major cause for your shortness of breath - we will test you for overnight oxygen levels to make sure oxygen level at night is fine  #Followup - depends on overnight oxygen study result

## 2011-08-17 NOTE — Progress Notes (Signed)
Hematology and Oncology Follow Up Visit  Abigail [name] [id number] [birth year] 56 y.o. 08/17/2011    HPI: Abigail [name] is a 56 year old [name] with recurrent fallopian tube/ovarian carcinoma for which she completed 2 cycles of carboplatin/Taxol, cycle 3 held for one week ago due to complicated medical history including 2 hospitalizations following each cycle of chemotherapy, currently day 1, cycle 2 of every other week Doxil.  2. Newly diagnosed right jugular vein DVT course of  Arixtra 7.5 mg subcutaneous daily, along with Coumadin 10mg  daily.  3. Previous history of breast carcinoma, BCRA positive.  Interim History:   Abigail [name] is seen today in followup in anticipation of her second cycle  of every other week Doxil for recurrent fallopian tube/ovarian carcinoma. She continues Coumadin 7.5mg  daily. She denies any bleeding or bruising symptoms. She denies any shortness of breath, chest pain, profound fatigue, no nausea, emesis, diarrhea or constipation issues. She denies any abdominal pain. She has no lingering significant neuropathy, but does note a little bit of numbness and tingling of the fingertips on her right hand which is chronic in nature. Energy level is actually quite good, and overall she feels quite well. A detailed review of systems is otherwise noncontributory as noted below.  Review of Systems: Constitutional:  no weight loss, fever, night sweats, feels well and fatigued Eyes: no complaints ENT: no complaints Cardiovascular: positive for - shortness of breath Respiratory: as above. Neurological: no TIA or stroke symptoms Dermatological: negative Gastrointestinal: no abdominal pain, change in bowel habits, or black or bloody stools Genito-Urinary: no dysuria, trouble voiding, or hematuria Hematological and Lymphatic: negative Breast: negative Musculoskeletal: negative Remaining ROS negative.  Medications:   I have reviewed the patient's current  medications.  Current Outpatient Prescriptions  Medication Sig Dispense Refill  . amLODipine (NORVASC) 5 MG tablet Take 1 tablet (5 mg total) by mouth daily.  30 tablet  11  . Cholecalciferol (VITAMIN D3) 3000 UNITS TABS Take 3,000 Units by mouth daily.       Marland Kitchen dexamethasone (DECADRON) 4 MG tablet Take 2 tablets by mouth daily starting the day after chemotherapy for 2 days. Take with food.  30 tablet  1  . lidocaine-prilocaine (EMLA) cream Apply topically as needed. For PAC  30 g  1  . lisinopril-hydrochlorothiazide (PRINZIDE,ZESTORETIC) 10-12.5 MG per tablet Take 1 tablet by mouth daily.      Marland Kitchen LORazepam (ATIVAN) 0.5 MG tablet Take 1 tablet (0.5 mg total) by mouth every 6 (six) hours as needed (Nausea or vomiting).  30 tablet  0  . ondansetron (ZOFRAN) 8 MG tablet Take 1 tablet two times a day starting the day after chemo for 2 days. Then take 1 tablet two times a day as needed for nausea or vomiting.  30 tablet  1  . potassium chloride SA (K-DUR,KLOR-CON) 20 MEQ tablet Take 1 tablet (20 mEq total) by mouth daily.  14 tablet  0  . prochlorperazine (COMPAZINE) 10 MG tablet Take 1 tablet (10 mg total) by mouth every 6 (six) hours as needed (Nausea or vomiting).  30 tablet  1  . prochlorperazine (COMPAZINE) 25 MG suppository Place 25 mg rectally every 12 (twelve) hours as needed.      . warfarin (COUMADIN) 5 MG tablet Take 1 tablet (5 mg total) by mouth daily. 1.5 tabs po qday, or as directed.  60 tablet  3  . DISCONTD: warfarin (COUMADIN) 5 MG tablet Take 7.5 mg by mouth daily.  No current facility-administered medications for this visit.   Facility-Administered Medications Ordered in Other Visits  Medication Dose Route Frequency Provider Last Rate Last Dose  . fondaparinux (ARIXTRA) injection 7.5 mg  7.5 mg Subcutaneous Q24H Pierce Crane, MD   7.5 mg at 07/17/11 1327    Allergies:  Allergies  Allergen Reactions  . Lisinopril Cough  . Codeine Itching  . Oxycodone Nausea Only  .  Vicodin (Hydrocodone-Acetaminophen) Nausea Only    Physical Exam: Filed Vitals:   08/17/11 0940  BP: 137/88  Pulse: 108  Temp: 98.9 F (37.2 C)    Body mass index is 38.98 kg/(m^2). Weight: 206 lbs. HEENT:  Sclerae anicteric, conjunctivae pink.  Oropharynx clear.  No mucositis or candidiasis.  No evidence of  frank distention over right jugular vein region. Nodes:  No cervical, supraclavicular, or axillary lymphadenopathy palpated.  Breast Exam:  Deferred.  Lungs:  Clear to auscultation bilaterally.  No crackles, rhonchi, or wheezes.   Heart:  Tachycardic rate, but normal rhythm.   Abdomen:  Soft, nontender.  Positive bowel sounds.  No organomegaly or masses palpated.   Musculoskeletal:  No focal spinal tenderness to palpation.  Extremities:  Benign.  She does have evidence of bilateral edema, but no cyanosis.   Skin:  Benign.   Neuro:  Nonfocal, alert and oriented x 3.   Lab Results: Lab Results  Component Value Date   WBC 7.1 08/17/2011   HGB 9.1* 08/17/2011   HCT 27.4* 08/17/2011   MCV 96.5 08/17/2011   PLT 275 08/17/2011   NEUTROABS 5.1 08/17/2011     Chemistry      Component Value Date/Time   NA 136 08/02/2011 1127   K 2.9* 08/02/2011 1127   CL 99 08/02/2011 1127   CO2 25 08/02/2011 1127   BUN 7 08/02/2011 1127   CREATININE 0.71 08/02/2011 1127      Component Value Date/Time   CALCIUM 9.4 08/02/2011 1127   ALKPHOS 84 07/30/2011 0838   AST 19 07/30/2011 0838   ALT 13 07/30/2011 0838   BILITOT 0.4 07/30/2011 0838      Lab Results  Component Value Date   LABCA2 63* 05/04/2011     Assessment:  Abigail [name] is a 56 year old [name] with recurrent fallopian tube/ovarian carcinoma for which she completed 2 cycles of carboplatin/Taxol, cycle 3 held for one week ago due to complicated medical history including 2 hospitalizations following each cycle of chemotherapy.  Currently day 2, cycle 2 of every two week Doxil today  2. Newly diagnosed right  jugular vein DVT, initially on  Arixtra 7.5 mg subcutaneous daily, now on Coumadin 7.5mg  with therapeutic INR.  3. Moderate anemia, though the patient is asymptomatic.  4. Previous history of breast carcinoma, BCRA positive.  5. Cardiac evaluation in progress.    Case reviewed with Dr. Pierce Crane.   Plan:  Meryn will receive day 1  cycle 2 of every 2 week Doxil.  She will  continue on Coumadin, 7.5 mg dose with PT/INR being checked along with a repeat CBC/PT+INR in one week's time during her followup. This plan was reviewed with the patient, who voices understanding and agreement.  She knows to call with any changes or problems.   Jovonte Commins T, PA-C 08/17/2011

## 2011-08-17 NOTE — Patient Instructions (Addendum)
[facility] Discharge Instructions for Patients Receiving Chemotherapy  Today you received the following chemotherapy agents doxil  To help prevent nausea and vomiting after your treatment, we encourage you to take your nausea medication   If you develop nausea and vomiting that is not controlled by your nausea medication, call the clinic. If it is after clinic hours your family physician or the after hours number for the clinic or go to the Emergency Department.   BELOW ARE SYMPTOMS THAT SHOULD BE REPORTED IMMEDIATELY:  *FEVER GREATER THAN 100.5 F  *CHILLS WITH OR WITHOUT FEVER  NAUSEA AND VOMITING THAT IS NOT CONTROLLED WITH YOUR NAUSEA MEDICATION  *UNUSUAL SHORTNESS OF BREATH  *UNUSUAL BRUISING OR BLEEDING  TENDERNESS IN MOUTH AND THROAT WITH OR WITHOUT PRESENCE OF ULCERS  *URINARY PROBLEMS  *BOWEL PROBLEMS  UNUSUAL RASH Items with * indicate a potential emergency and should be followed up as soon as possible.  The clinic phone number is [phone].   I have been informed and understand all the instructions given to me. I know to contact the clinic, my physician, or go to the Emergency Department if any problems should occur. I do not have any questions at this time, but understand that I may call the clinic during office hours   should I have any questions or need assistance in obtaining follow up care.    __________________________________________  _____________  __________ Signature of Patient or Authorized Representative            Date                   Time    __________________________________________ Nurse's Signature    

## 2011-08-20 ENCOUNTER — Other Ambulatory Visit: Payer: Self-pay | Admitting: Oncology

## 2011-08-20 ENCOUNTER — Other Ambulatory Visit: Payer: Medicare Other

## 2011-08-20 DIAGNOSIS — C5700 Malignant neoplasm of unspecified fallopian tube: Secondary | ICD-10-CM

## 2011-08-20 DIAGNOSIS — E876 Hypokalemia: Secondary | ICD-10-CM

## 2011-08-22 ENCOUNTER — Other Ambulatory Visit: Payer: Self-pay | Admitting: Physician Assistant

## 2011-08-22 ENCOUNTER — Ambulatory Visit (HOSPITAL_COMMUNITY): Payer: Medicare Other | Attending: Cardiology | Admitting: Radiology

## 2011-08-22 VITALS — BP 108/75 | Ht 61.0 in | Wt 205.0 lb

## 2011-08-22 DIAGNOSIS — I10 Essential (primary) hypertension: Secondary | ICD-10-CM | POA: Insufficient documentation

## 2011-08-22 DIAGNOSIS — Z8249 Family history of ischemic heart disease and other diseases of the circulatory system: Secondary | ICD-10-CM | POA: Insufficient documentation

## 2011-08-22 DIAGNOSIS — I4949 Other premature depolarization: Secondary | ICD-10-CM

## 2011-08-22 DIAGNOSIS — R002 Palpitations: Secondary | ICD-10-CM | POA: Insufficient documentation

## 2011-08-22 DIAGNOSIS — R0989 Other specified symptoms and signs involving the circulatory and respiratory systems: Secondary | ICD-10-CM | POA: Insufficient documentation

## 2011-08-22 DIAGNOSIS — C5700 Malignant neoplasm of unspecified fallopian tube: Secondary | ICD-10-CM | POA: Insufficient documentation

## 2011-08-22 DIAGNOSIS — C50919 Malignant neoplasm of unspecified site of unspecified female breast: Secondary | ICD-10-CM | POA: Insufficient documentation

## 2011-08-22 DIAGNOSIS — Z1501 Genetic susceptibility to malignant neoplasm of breast: Secondary | ICD-10-CM

## 2011-08-22 DIAGNOSIS — Z87891 Personal history of nicotine dependence: Secondary | ICD-10-CM | POA: Insufficient documentation

## 2011-08-22 DIAGNOSIS — R0609 Other forms of dyspnea: Secondary | ICD-10-CM | POA: Insufficient documentation

## 2011-08-22 DIAGNOSIS — R0602 Shortness of breath: Secondary | ICD-10-CM | POA: Insufficient documentation

## 2011-08-22 DIAGNOSIS — R0600 Dyspnea, unspecified: Secondary | ICD-10-CM

## 2011-08-22 DIAGNOSIS — R000 Tachycardia, unspecified: Secondary | ICD-10-CM | POA: Insufficient documentation

## 2011-08-22 MED ORDER — TECHNETIUM TC 99M TETROFOSMIN IV KIT
33.0000 | PACK | Freq: Once | INTRAVENOUS | Status: AC | PRN
  Administered 2011-08-22: 33 via INTRAVENOUS

## 2011-08-22 MED ORDER — TECHNETIUM TC 99M TETROFOSMIN IV KIT
11.0000 | PACK | Freq: Once | INTRAVENOUS | Status: AC | PRN
  Administered 2011-08-22: 11 via INTRAVENOUS

## 2011-08-22 MED ORDER — REGADENOSON 0.4 MG/5ML IV SOLN
0.4000 mg | Freq: Once | INTRAVENOUS | Status: AC
  Administered 2011-08-22: 0.4 mg via INTRAVENOUS

## 2011-08-22 NOTE — Progress Notes (Signed)
[facility] SITE 3 NUCLEAR MED [address] [place] [address] [phone]  Cardiology Nuclear Med Study  Abigail [name] is a 56 y.o. female     MRN : [id number]     DOB: [birth year]  Procedure Date: 08/22/2011  Nuclear Med Background Indication for Stress Test:  Evaluation for Ischemia History:  08/13/11 ECHO: EF: 60-65% Mod LVH Breast CA Fallopian tube CA : Chemo at present new RJV  DVT Cardiac Risk Factors: Family History - CAD, History of Smoking and Hypertension  Symptoms:  DOE, Palpitations, Rapid HR and SOB   Nuclear Pre-Procedure Caffeine/Decaff Intake:  None NPO After: 9:00pm   Lungs:  clear O2 Sat: 98% on room air. IV 0.9% NS with Angio Cath:  22g  IV Site: R Forearm  IV Started by:  Stanton Kidney, EMT-P  Chest Size (in):  34 Cup Size: C  Height: 5\' 1"  (1.549 m)  Weight:  205 lb (92.987 kg)  BMI:  Body mass index is 38.73 kg/(m^2). Tech Comments:  NA    Nuclear Med Study 1 or 2 day study: 1 day  Stress Test Type:  [name]  Reading MD: Marca Ancona, MD  Order Authorizing Provider:  J.Hochrein  Resting Radionuclide: Technetium 27m Tetrofosmin  Resting Radionuclide Dose: 11.0 mCi   Stress Radionuclide:  Technetium 49m Tetrofosmin  Stress Radionuclide Dose: 33.0 mCi           Stress Protocol Rest HR: 105 Stress HR: 136  Rest BP: 108/75 Stress BP: 110/74  Exercise Time (min): n/a METS: n/a   Predicted Max HR: 162 bpm % Max HR: 83.95 bpm Rate Pressure Product: 65784   Dose of Adenosine (mg):  n/a Dose of Lexiscan: 0.4 mg  Dose of Atropine (mg): n/a Dose of Dobutamine: n/a mcg/kg/min (at max HR)  Stress Test Technologist: Milana Na, EMT-P  Nuclear Technologist:  Domenic Polite, CNMT     Rest Procedure:  Myocardial perfusion imaging was performed at rest 45 minutes following the intravenous administration of Technetium 58m Tetrofosmin. Rest ECG: Stach PVCS  Stress Procedure:  The patient received IV Lexiscan 0.4 mg over 15-seconds.   Technetium 49m Tetrofosmin injected at 30-seconds.  There were no significant changes, + sob, headache, and occ pvcs with Lexiscan.  Quantitative spect images were obtained after a 45 minute delay. Stress ECG: No significant change from baseline ECG  QPS Raw Data Images:  Normal; no motion artifact; normal heart/lung ratio. Stress Images:  Normal homogeneous uptake in all areas of the myocardium. Rest Images:  Normal homogeneous uptake in all areas of the myocardium. Subtraction (SDS):  There is no evidence of scar or ischemia. Transient Ischemic Dilatation (Normal <1.22):  1.11 Lung/Heart Ratio (Normal <0.45):  0.31  Quantitative Gated Spect Images QGS EDV:  71 ml QGS ESV:  34 ml  Impression Exercise Capacity:  Lexiscan with no exercise. BP Response:  Normal blood pressure response. Clinical Symptoms:  Short of breath.  ECG Impression:  There are scattered PVCs. Comparison with Prior Nuclear Study: No images to compare  Overall Impression:  Normal stress nuclear study.  LV Ejection Fraction: 53%.  LV Wall Motion:  NL LV Function; NL Wall Motion  EF calculated mildly decreased, visually looks normal.   Marca Ancona 08/22/2011

## 2011-08-23 ENCOUNTER — Ambulatory Visit (HOSPITAL_BASED_OUTPATIENT_CLINIC_OR_DEPARTMENT_OTHER): Payer: Medicare Other | Admitting: Physician Assistant

## 2011-08-23 ENCOUNTER — Telehealth: Payer: Self-pay | Admitting: *Deleted

## 2011-08-23 ENCOUNTER — Other Ambulatory Visit (HOSPITAL_BASED_OUTPATIENT_CLINIC_OR_DEPARTMENT_OTHER): Payer: Medicare Other | Admitting: Lab

## 2011-08-23 VITALS — BP 125/84 | HR 117 | Temp 99.5°F | Ht 61.0 in | Wt 206.1 lb

## 2011-08-23 DIAGNOSIS — O2230 Deep phlebothrombosis in pregnancy, unspecified trimester: Secondary | ICD-10-CM

## 2011-08-23 DIAGNOSIS — D649 Anemia, unspecified: Secondary | ICD-10-CM

## 2011-08-23 DIAGNOSIS — C5700 Malignant neoplasm of unspecified fallopian tube: Secondary | ICD-10-CM

## 2011-08-23 DIAGNOSIS — Z853 Personal history of malignant neoplasm of breast: Secondary | ICD-10-CM

## 2011-08-23 DIAGNOSIS — Z1501 Genetic susceptibility to malignant neoplasm of breast: Secondary | ICD-10-CM

## 2011-08-23 DIAGNOSIS — I82409 Acute embolism and thrombosis of unspecified deep veins of unspecified lower extremity: Secondary | ICD-10-CM

## 2011-08-23 LAB — CBC WITH DIFFERENTIAL/PLATELET
BASO%: 0.3 % (ref 0.0–2.0)
Eosinophils Absolute: 0.1 10*3/uL (ref 0.0–0.5)
HCT: 29.1 % — ABNORMAL LOW (ref 34.8–46.6)
LYMPH%: 18.4 % (ref 14.0–49.7)
MCHC: 33.7 g/dL (ref 31.5–36.0)
MONO#: 0.4 10*3/uL (ref 0.1–0.9)
NEUT#: 5.3 10*3/uL (ref 1.5–6.5)
NEUT%: 75.1 % (ref 38.4–76.8)
Platelets: 337 10*3/uL (ref 145–400)
RBC: 3.02 10*6/uL — ABNORMAL LOW (ref 3.70–5.45)
WBC: 7.1 10*3/uL (ref 3.9–10.3)
lymph#: 1.3 10*3/uL (ref 0.9–3.3)
nRBC: 0 % (ref 0–0)

## 2011-08-23 LAB — PROTIME-INR

## 2011-08-23 NOTE — Progress Notes (Signed)
Hematology and Oncology Follow Up Visit  Abigail [name] [id number] 1955/12/26 56 y.o. 08/23/2011    HPI: Abigail [name] is a 56 year old [name] with recurrent fallopian tube/ovarian carcinoma for which she completed 2 cycles of carboplatin/Taxol, cycle 3 held for one week ago due to complicated medical history including 2 hospitalizations following each cycle of chemotherapy, currently day 7, cycle 2 of every other week Doxil.  2. Newly diagnosed right jugular vein DVT course of  Arixtra 7.5 mg subcutaneous daily, along with Coumadin 10mg  daily.  3. Previous history of breast carcinoma, BCRA positive.  Interim History:   Abigail [name] is seen today in followup after her second cycle  of every other week Doxil for recurrent fallopian tube/ovarian carcinoma. She continues Coumadin 7.5mg  daily. She denies any bleeding or bruising symptoms. She denies any shortness of breath, chest pain, profound fatigue.  She has undergone a cardiac stress test as performed by Dr. Shirlee Latch, all looked normal. She has also had a 2-D echocardiogram performed which revealed a left ventricular ejection fraction between 60 and 65%. She has experienced intermittent diarrhea over the past couple of days, worse being early morning on 08/22/2011. She also experienced some low-grade nausea. The symptoms are improving, but she also notes that she has not used any Imodium.  She denies any exposure to people with similar symptoms.  She denies any increasing abdominal pain or discomfort. No melanic stools. She has no lingering significant neuropathy, but does note a little bit of numbness and tingling of the fingertips on her right hand which is chronic in nature. Energy level is actually quite good, and overall she feels quite well. A detailed review of systems is otherwise noncontributory as noted below.  Review of Systems: Constitutional:  no weight loss, fever, night sweats, feels well and fatigued Eyes: no  complaints ENT: no complaints Cardiovascular: positive for - shortness of breath Respiratory: as above. Neurological: no TIA or stroke symptoms Dermatological: negative Gastrointestinal: no abdominal pain, change in bowel habits, or black or bloody stools Genito-Urinary: no dysuria, trouble voiding, or hematuria Hematological and Lymphatic: negative Breast: negative Musculoskeletal: negative Remaining ROS negative.  Medications:   I have reviewed the patient's current medications.  Current Outpatient Prescriptions  Medication Sig Dispense Refill  . amLODipine (NORVASC) 5 MG tablet Take 1 tablet (5 mg total) by mouth daily.  30 tablet  11  . Cholecalciferol (VITAMIN D3) 3000 UNITS TABS Take 3,000 Units by mouth daily.       Marland Kitchen dexamethasone (DECADRON) 4 MG tablet Take 2 tablets by mouth daily starting the day after chemotherapy for 2 days. Take with food.  30 tablet  1  . KLOR-CON M20 20 MEQ tablet TAKE 1 TABLET BY MOUTH EVERY DAY  30 tablet  0  . lidocaine-prilocaine (EMLA) cream Apply topically as needed. For PAC  30 g  1  . lisinopril-hydrochlorothiazide (PRINZIDE,ZESTORETIC) 10-12.5 MG per tablet Take 1 tablet by mouth daily.      Marland Kitchen LORazepam (ATIVAN) 0.5 MG tablet Take 1 tablet (0.5 mg total) by mouth every 6 (six) hours as needed (Nausea or vomiting).  30 tablet  0  . ondansetron (ZOFRAN) 8 MG tablet Take 1 tablet two times a day starting the day after chemo for 2 days. Then take 1 tablet two times a day as needed for nausea or vomiting.  30 tablet  1  . prochlorperazine (COMPAZINE) 10 MG tablet Take 1 tablet (10 mg total) by mouth every 6 (six) hours as needed (Nausea  or vomiting).  30 tablet  1  . prochlorperazine (COMPAZINE) 25 MG suppository Place 25 mg rectally every 12 (twelve) hours as needed.      . warfarin (COUMADIN) 5 MG tablet Take 1 tablet (5 mg total) by mouth daily. 1.5 tabs po qday, or as directed.  60 tablet  3   No current facility-administered medications for this  visit.   Facility-Administered Medications Ordered in Other Visits  Medication Dose Route Frequency Provider Last Rate Last Dose  . fondaparinux (ARIXTRA) injection 7.5 mg  7.5 mg Subcutaneous Q24H Pierce Crane, MD   7.5 mg at 07/17/11 1327    Allergies:  Allergies  Allergen Reactions  . Lisinopril Cough  . Codeine Itching  . Oxycodone Nausea Only  . Vicodin (Hydrocodone-Acetaminophen) Nausea Only    Physical Exam: Filed Vitals:   08/23/11 1313  BP: 125/84  Pulse: 117  Temp: 99.5 F (37.5 C)    Body mass index is 38.94 kg/(m^2). Weight: 206 lbs. HEENT:  Sclerae anicteric, conjunctivae pink.  Oropharynx clear.  No mucositis or candidiasis.  No evidence of  frank distention over right jugular vein region. Nodes:  No cervical, supraclavicular, or axillary lymphadenopathy palpated.  Breast Exam:  Deferred.  Lungs:  Clear to auscultation bilaterally.  No crackles, rhonchi, or wheezes.   Heart:  Tachycardic rate, but normal rhythm.   Abdomen:  Soft, nontender.  Positive to hyperactive bowel sounds.  No organomegaly or masses palpated.   Musculoskeletal:  No focal spinal tenderness to palpation.  Extremities:  Benign.  She does have evidence of bilateral edema, but no cyanosis.   Skin:  Benign.   Neuro:  Nonfocal, alert and oriented x 3.   Lab Results: Lab Results  Component Value Date   WBC 7.1 08/23/2011   HGB 9.8* 08/23/2011   HCT 29.1* 08/23/2011   MCV 96.4 08/23/2011   PLT 337 08/23/2011   NEUTROABS 5.3 08/23/2011     Chemistry      Component Value Date/Time   NA 138 08/17/2011 0919   K 3.7 08/17/2011 0919   CL 102 08/17/2011 0919   CO2 25 08/17/2011 0919   BUN 8 08/17/2011 0919   CREATININE 0.71 08/17/2011 0919      Component Value Date/Time   CALCIUM 9.3 08/17/2011 0919   ALKPHOS 96 08/17/2011 0919   AST 14 08/17/2011 0919   ALT 9 08/17/2011 0919   BILITOT 0.4 08/17/2011 0919      Lab Results  Component Value Date   LABCA2 63* 05/04/2011     Assessment:  Ms.  Abigail [name] is a 56 year old [name] with recurrent fallopian tube/ovarian carcinoma for which she completed 2 cycles of carboplatin/Taxol, cycle 3 held for one week ago due to complicated medical history including 2 hospitalizations following each cycle of chemotherapy.  Currently day 7, cycle 2 of every two week Doxil today  2. Newly diagnosed right jugular vein DVT, initially on  Arixtra 7.5 mg subcutaneous daily, now on Coumadin 7.5mg  with therapeutic INR.  3. Moderate anemia, though the patient is asymptomatic.  4. Previous history of breast carcinoma, BCRA positive.  5. Diarrhea.    Case reviewed with Dr. Pierce Crane.   Plan:  Abigail [name] will return on 08/30/11 for a follow up in anticipation of day 1  cycle 3 of every 2 week Doxil.  She will  continue on Coumadin, 7.5 mg dose with PT/INR being checked along with a repeat CBC, CMET, CA-125 in one week's time during her followup.  In the interim, if her diarrhea symptoms do not improve over the next 24 hours, she will contact us, we may need to consider a KUB. This plan was reviewed with the patient, who voices understanding and agreement.  She knows to call with any changes or problems.   Abigail Chamblee T, PA-C 08/23/2011

## 2011-08-23 NOTE — Telephone Encounter (Signed)
Gave patient appointment for 09-06-2011 starting at 8:00am printed out calendar and gave to the patient

## 2011-08-30 ENCOUNTER — Encounter: Payer: Self-pay | Admitting: Internal Medicine

## 2011-08-30 ENCOUNTER — Other Ambulatory Visit (HOSPITAL_BASED_OUTPATIENT_CLINIC_OR_DEPARTMENT_OTHER): Payer: Medicare Other | Admitting: Lab

## 2011-08-30 ENCOUNTER — Ambulatory Visit (HOSPITAL_BASED_OUTPATIENT_CLINIC_OR_DEPARTMENT_OTHER): Payer: Medicare Other | Admitting: Physician Assistant

## 2011-08-30 ENCOUNTER — Ambulatory Visit (HOSPITAL_BASED_OUTPATIENT_CLINIC_OR_DEPARTMENT_OTHER): Payer: Medicare Other

## 2011-08-30 VITALS — BP 135/86 | HR 111 | Temp 98.5°F | Ht 61.0 in | Wt 207.5 lb

## 2011-08-30 DIAGNOSIS — C5700 Malignant neoplasm of unspecified fallopian tube: Secondary | ICD-10-CM

## 2011-08-30 DIAGNOSIS — Z5181 Encounter for therapeutic drug level monitoring: Secondary | ICD-10-CM

## 2011-08-30 DIAGNOSIS — I82409 Acute embolism and thrombosis of unspecified deep veins of unspecified lower extremity: Secondary | ICD-10-CM

## 2011-08-30 DIAGNOSIS — Z7901 Long term (current) use of anticoagulants: Secondary | ICD-10-CM

## 2011-08-30 DIAGNOSIS — Z1501 Genetic susceptibility to malignant neoplasm of breast: Secondary | ICD-10-CM

## 2011-08-30 DIAGNOSIS — Z5111 Encounter for antineoplastic chemotherapy: Secondary | ICD-10-CM

## 2011-08-30 DIAGNOSIS — Z853 Personal history of malignant neoplasm of breast: Secondary | ICD-10-CM

## 2011-08-30 DIAGNOSIS — I82890 Acute embolism and thrombosis of other specified veins: Secondary | ICD-10-CM

## 2011-08-30 LAB — CA 125: CA 125: 10.6 U/mL (ref 0.0–30.2)

## 2011-08-30 LAB — CBC WITH DIFFERENTIAL/PLATELET
EOS%: 0.4 % (ref 0.0–7.0)
Eosinophils Absolute: 0 10*3/uL (ref 0.0–0.5)
MCH: 31.8 pg (ref 25.1–34.0)
MCV: 96.8 fL (ref 79.5–101.0)
MONO%: 10.1 % (ref 0.0–14.0)
NEUT#: 5.5 10*3/uL (ref 1.5–6.5)
RBC: 2.83 10*6/uL — ABNORMAL LOW (ref 3.70–5.45)
RDW: 17.7 % — ABNORMAL HIGH (ref 11.2–14.5)
nRBC: 0 % (ref 0–0)

## 2011-08-30 LAB — COMPREHENSIVE METABOLIC PANEL
ALT: 20 U/L (ref 0–35)
Alkaline Phosphatase: 100 U/L (ref 39–117)
Sodium: 137 mEq/L (ref 135–145)
Total Bilirubin: 0.3 mg/dL (ref 0.3–1.2)
Total Protein: 6.5 g/dL (ref 6.0–8.3)

## 2011-08-30 MED ORDER — SODIUM CHLORIDE 0.9 % IV SOLN
Freq: Once | INTRAVENOUS | Status: AC
  Administered 2011-08-30: 11:00:00 via INTRAVENOUS

## 2011-08-30 MED ORDER — DEXAMETHASONE SODIUM PHOSPHATE 10 MG/ML IJ SOLN
10.0000 mg | Freq: Once | INTRAMUSCULAR | Status: AC
  Administered 2011-08-30: 10 mg via INTRAVENOUS

## 2011-08-30 MED ORDER — ONDANSETRON 8 MG/50ML IVPB (CHCC)
8.0000 mg | Freq: Once | INTRAVENOUS | Status: AC
  Administered 2011-08-30: 8 mg via INTRAVENOUS

## 2011-08-30 MED ORDER — SODIUM CHLORIDE 0.9 % IJ SOLN
10.0000 mL | INTRAMUSCULAR | Status: DC | PRN
  Administered 2011-08-30: 10 mL
  Filled 2011-08-30: qty 10

## 2011-08-30 MED ORDER — DOXORUBICIN HCL LIPOSOMAL CHEMO INJECTION 2 MG/ML
30.0000 mg/m2 | Freq: Once | INTRAVENOUS | Status: AC
  Administered 2011-08-30: 60 mg via INTRAVENOUS
  Filled 2011-08-30: qty 30

## 2011-08-30 MED ORDER — HEPARIN SOD (PORK) LOCK FLUSH 100 UNIT/ML IV SOLN
500.0000 [IU] | Freq: Once | INTRAVENOUS | Status: AC | PRN
  Administered 2011-08-30: 500 [IU]
  Filled 2011-08-30: qty 5

## 2011-08-30 NOTE — Patient Instructions (Addendum)
[facility] Discharge Instructions for Patients Receiving Chemotherapy  Today you received the following chemotherapy agents: Doxil   To help prevent nausea and vomiting after your treatment, we encourage you to take your nausea medication.  Take it as often as prescribed.     If you develop nausea and vomiting that is not controlled by your nausea medication, call the clinic. If it is after clinic hours your family physician or the after hours number for the clinic or go to the Emergency Department.   BELOW ARE SYMPTOMS THAT SHOULD BE REPORTED IMMEDIATELY:  *FEVER GREATER THAN 100.5 F  *CHILLS WITH OR WITHOUT FEVER  NAUSEA AND VOMITING THAT IS NOT CONTROLLED WITH YOUR NAUSEA MEDICATION  *UNUSUAL SHORTNESS OF BREATH  *UNUSUAL BRUISING OR BLEEDING  TENDERNESS IN MOUTH AND THROAT WITH OR WITHOUT PRESENCE OF ULCERS  *URINARY PROBLEMS  *BOWEL PROBLEMS  UNUSUAL RASH Items with * indicate a potential emergency and should be followed up as soon as possible.  Feel free to call the clinic you have any questions or concerns. The clinic phone number is [phone].   I have been informed and understand all the instructions given to me. I know to contact the clinic, my physician, or go to the Emergency Department if any problems should occur. I do not have any questions at this time, but understand that I may call the clinic during office hours   should I have any questions or need assistance in obtaining follow up care.    __________________________________________  _____________  __________ Signature of Patient or Authorized Representative            Date                   Time    __________________________________________ Nurse's Signature    

## 2011-08-30 NOTE — Progress Notes (Signed)
Hematology and Oncology Follow Up Visit  Abigail [name] [id number] 1956/01/05 56 y.o. 08/30/2011    HPI: Abigail [name] is a 56 year old [name] with recurrent fallopian tube/ovarian carcinoma for which she completed 2 cycles of carboplatin/Taxol, cycle 3 held for one week ago due to complicated medical history including 2 hospitalizations following each cycle of chemotherapy, currently day 1, cycle 3 of every other week Doxil.  2. Newly diagnosed right jugular vein DVT, prior course of  Arixtra 7.5 mg subcutaneous daily, now on Coumadin 7.5mg  daily.  3. Previous history of breast carcinoma, BCRA positive.  Interim History:   Abigail [name] is seen today in followup in anticipation of her third cycle  of every other week Doxil for recurrent fallopian tube/ovarian carcinoma. She continues Coumadin 7.5mg  daily. She denies any bleeding or bruising symptoms. She denies any shortness of breath, chest pain, profound fatigue, no nausea, emesis, diarrhea or constipation issues. She denies any abdominal pain. She has no lingering significant neuropathy, but does note a little bit of numbness and tingling of the fingertips on her right hand which is chronic in nature. Energy level is actually quite good, and overall she feels quite well. A detailed review of systems is otherwise noncontributory as noted below.  Review of Systems: Constitutional:  no weight loss, fever, night sweats, feels well and fatigued Eyes: no complaints ENT: no complaints Cardiovascular: positive for - shortness of breath Respiratory: as above. Neurological: no TIA or stroke symptoms Dermatological: negative Gastrointestinal: no abdominal pain, change in bowel habits, or black or bloody stools Genito-Urinary: no dysuria, trouble voiding, or hematuria Hematological and Lymphatic: negative Breast: negative Musculoskeletal: negative Remaining ROS negative.  Medications:   I have reviewed the patient's  current medications.  Current Outpatient Prescriptions  Medication Sig Dispense Refill  . amLODipine (NORVASC) 5 MG tablet Take 1 tablet (5 mg total) by mouth daily.  30 tablet  11  . Cholecalciferol (VITAMIN D3) 3000 UNITS TABS Take 3,000 Units by mouth daily.       Marland Kitchen dexamethasone (DECADRON) 4 MG tablet Take 2 tablets by mouth daily starting the day after chemotherapy for 2 days. Take with food.  30 tablet  1  . KLOR-CON M20 20 MEQ tablet TAKE 1 TABLET BY MOUTH EVERY DAY  30 tablet  0  . lidocaine-prilocaine (EMLA) cream Apply topically as needed. For PAC  30 g  1  . lisinopril-hydrochlorothiazide (PRINZIDE,ZESTORETIC) 10-12.5 MG per tablet Take 1 tablet by mouth daily.      Marland Kitchen LORazepam (ATIVAN) 0.5 MG tablet Take 1 tablet (0.5 mg total) by mouth every 6 (six) hours as needed (Nausea or vomiting).  30 tablet  0  . ondansetron (ZOFRAN) 8 MG tablet Take 1 tablet two times a day starting the day after chemo for 2 days. Then take 1 tablet two times a day as needed for nausea or vomiting.  30 tablet  1  . prochlorperazine (COMPAZINE) 10 MG tablet Take 1 tablet (10 mg total) by mouth every 6 (six) hours as needed (Nausea or vomiting).  30 tablet  1  . prochlorperazine (COMPAZINE) 25 MG suppository Place 25 mg rectally every 12 (twelve) hours as needed.      . warfarin (COUMADIN) 5 MG tablet Take 1 tablet (5 mg total) by mouth daily. 1.5 tabs po qday, or as directed.  60 tablet  3   No current facility-administered medications for this visit.   Facility-Administered Medications Ordered in Other Visits  Medication Dose Route Frequency Provider Last  Rate Last Dose  . fondaparinux (ARIXTRA) injection 7.5 mg  7.5 mg Subcutaneous Q24H Pierce Crane, MD   7.5 mg at 07/17/11 1327    Allergies:  Allergies  Allergen Reactions  . Lisinopril Cough  . Codeine Itching  . Oxycodone Nausea Only  . Vicodin (Hydrocodone-Acetaminophen) Nausea Only    Physical Exam: Filed Vitals:   08/30/11 0938  BP: 135/86   Pulse: 111  Temp: 98.5 F (36.9 C)    Body mass index is 39.21 kg/(m^2). Weight: 207 lbs. HEENT:  Sclerae anicteric, conjunctivae pink.  Oropharynx clear.  No mucositis or candidiasis.  No evidence of  frank distention over right jugular vein region. Nodes:  No cervical, supraclavicular, or axillary lymphadenopathy palpated.  Breast Exam:  Deferred.  Lungs:  Clear to auscultation bilaterally.  No crackles, rhonchi, or wheezes.   Heart:  Tachycardic rate, but normal rhythm.   Abdomen:  Soft, nontender.  Positive bowel sounds.  No organomegaly or masses palpated.   Musculoskeletal:  No focal spinal tenderness to palpation.  Extremities:  Benign.  She does have evidence of bilateral edema, but no cyanosis.   Skin:  Benign.   Neuro:  Nonfocal, alert and oriented x 3.   Lab Results: Lab Results  Component Value Date   WBC 7.5 08/30/2011   HGB 9.0* 08/30/2011   HCT 27.4* 08/30/2011   MCV 96.8 08/30/2011   PLT 267 08/30/2011   NEUTROABS 5.5 08/30/2011     Chemistry      Component Value Date/Time   NA 138 08/17/2011 0919   K 3.7 08/17/2011 0919   CL 102 08/17/2011 0919   CO2 25 08/17/2011 0919   BUN 8 08/17/2011 0919   CREATININE 0.71 08/17/2011 0919      Component Value Date/Time   CALCIUM 9.3 08/17/2011 0919   ALKPHOS 96 08/17/2011 0919   AST 14 08/17/2011 0919   ALT 9 08/17/2011 0919   BILITOT 0.4 08/17/2011 0919      Lab Results  Component Value Date   LABCA2 63* 05/04/2011     Assessment:  Abigail [name] is a 56 year old [name] with recurrent fallopian tube/ovarian carcinoma for which she completed 2 cycles of carboplatin/Taxol, cycle 3 held for one week ago due to complicated medical history including 2 hospitalizations following each cycle of chemotherapy.  Currently day 1, cycle 3 of every two week Doxil today  2. Newly diagnosed right jugular vein DVT, initially on  Arixtra 7.5 mg subcutaneous daily, now on Coumadin 7.5mg  with slightly supratherapeutic  INR.  3. Moderate anemia, though the patient is asymptomatic.  4. Previous history of breast carcinoma, BCRA positive.   Case reviewed with Dr. Pierce Crane.    Plan:  Abigail [name] will receive day 1  cycle 3 of every 2 week Doxil.  She will  continue on Coumadin 7.5 mg dose, but hold tonight's dosing.  A PT/INR will be checked along with a repeat CBC in one week's time during her followup.  We will also plan restaging PET prior to 09/06/11 appointment. This plan was reviewed with the patient, who voices understanding and agreement.  She knows to call with any changes or problems.   Jedidiah Demartini T, PA-C 08/30/2011

## 2011-08-31 ENCOUNTER — Telehealth: Payer: Self-pay | Admitting: *Deleted

## 2011-08-31 ENCOUNTER — Telehealth: Payer: Self-pay | Admitting: Oncology

## 2011-08-31 NOTE — Telephone Encounter (Signed)
The pt is aware to pick up her aug 2013 appt calendar

## 2011-08-31 NOTE — Telephone Encounter (Signed)
Per staff message and POF I have scheduled appts.  JMW  

## 2011-09-05 ENCOUNTER — Other Ambulatory Visit: Payer: Self-pay | Admitting: Physician Assistant

## 2011-09-05 ENCOUNTER — Encounter (HOSPITAL_COMMUNITY)
Admission: RE | Admit: 2011-09-05 | Discharge: 2011-09-05 | Disposition: A | Discharge status: Home / Self Care | Payer: Medicare Other | Source: Ambulatory Visit | Attending: Physician Assistant | Admitting: Physician Assistant

## 2011-09-05 DIAGNOSIS — R599 Enlarged lymph nodes, unspecified: Secondary | ICD-10-CM | POA: Insufficient documentation

## 2011-09-05 DIAGNOSIS — Z1501 Genetic susceptibility to malignant neoplasm of breast: Secondary | ICD-10-CM

## 2011-09-05 DIAGNOSIS — Z90710 Acquired absence of both cervix and uterus: Secondary | ICD-10-CM | POA: Insufficient documentation

## 2011-09-05 DIAGNOSIS — I517 Cardiomegaly: Secondary | ICD-10-CM | POA: Insufficient documentation

## 2011-09-05 DIAGNOSIS — C5700 Malignant neoplasm of unspecified fallopian tube: Secondary | ICD-10-CM | POA: Insufficient documentation

## 2011-09-05 DIAGNOSIS — K669 Disorder of peritoneum, unspecified: Secondary | ICD-10-CM | POA: Insufficient documentation

## 2011-09-05 LAB — GLUCOSE, CAPILLARY: Glucose-Capillary: 102 mg/dL — ABNORMAL HIGH (ref 70–99)

## 2011-09-05 MED ORDER — FLUDEOXYGLUCOSE F - 18 (FDG) INJECTION
15.5000 | Freq: Once | INTRAVENOUS | Status: AC | PRN
  Administered 2011-09-05: 15.5 via INTRAVENOUS

## 2011-09-06 ENCOUNTER — Other Ambulatory Visit (HOSPITAL_BASED_OUTPATIENT_CLINIC_OR_DEPARTMENT_OTHER): Payer: Medicare Other | Admitting: Lab

## 2011-09-06 ENCOUNTER — Ambulatory Visit (HOSPITAL_BASED_OUTPATIENT_CLINIC_OR_DEPARTMENT_OTHER): Payer: Medicare Other | Admitting: Oncology

## 2011-09-06 ENCOUNTER — Telehealth: Payer: Self-pay | Admitting: *Deleted

## 2011-09-06 ENCOUNTER — Telehealth: Payer: Self-pay | Admitting: Emergency Medicine

## 2011-09-06 VITALS — BP 140/89 | HR 108 | Temp 98.9°F | Ht 61.0 in | Wt 211.4 lb

## 2011-09-06 DIAGNOSIS — D649 Anemia, unspecified: Secondary | ICD-10-CM

## 2011-09-06 DIAGNOSIS — Z1501 Genetic susceptibility to malignant neoplasm of breast: Secondary | ICD-10-CM

## 2011-09-06 DIAGNOSIS — C5700 Malignant neoplasm of unspecified fallopian tube: Secondary | ICD-10-CM

## 2011-09-06 DIAGNOSIS — I82409 Acute embolism and thrombosis of unspecified deep veins of unspecified lower extremity: Secondary | ICD-10-CM

## 2011-09-06 DIAGNOSIS — Z853 Personal history of malignant neoplasm of breast: Secondary | ICD-10-CM

## 2011-09-06 DIAGNOSIS — I82890 Acute embolism and thrombosis of other specified veins: Secondary | ICD-10-CM

## 2011-09-06 LAB — CBC WITH DIFFERENTIAL/PLATELET
BASO%: 0.2 % (ref 0.0–2.0)
Basophils Absolute: 0 10*3/uL (ref 0.0–0.1)
EOS%: 1.5 % (ref 0.0–7.0)
MCH: 34 pg (ref 25.1–34.0)
MCHC: 33.4 g/dL (ref 31.5–36.0)
MCV: 101.8 fL — ABNORMAL HIGH (ref 79.5–101.0)
MONO%: 8.7 % (ref 0.0–14.0)
RBC: 2.86 10*6/uL — ABNORMAL LOW (ref 3.70–5.45)
RDW: 19.2 % — ABNORMAL HIGH (ref 11.2–14.5)
lymph#: 0.9 10*3/uL (ref 0.9–3.3)

## 2011-09-06 LAB — PROTIME-INR
INR: 3.2 (ref 2.00–3.50)
Protime: 38.4 Seconds — ABNORMAL HIGH (ref 10.6–13.4)

## 2011-09-06 LAB — COMPREHENSIVE METABOLIC PANEL
ALT: 26 U/L (ref 0–35)
AST: 18 U/L (ref 0–37)
Albumin: 3.5 g/dL (ref 3.5–5.2)
Alkaline Phosphatase: 94 U/L (ref 39–117)
BUN: 14 mg/dL (ref 6–23)
Potassium: 3.8 mEq/L (ref 3.5–5.3)

## 2011-09-06 NOTE — Telephone Encounter (Signed)
Pt advised of f/u appt 09/26/11 @ 1330 per request Dr Donnie Coffin

## 2011-09-06 NOTE — Telephone Encounter (Signed)
Per MD, patient advised to continue taking Coumadin 7.5mg  daily and return for labs on 8/8 as scheduled. Patient verbalized understanding.

## 2011-09-06 NOTE — Progress Notes (Signed)
Hematology and Oncology Follow Up Visit  Abigail [name] [id number] 1955-04-21 56 y.o. 09/06/2011    HPI: Abigail [name] is a 56 year old [name] with recurrent fallopian tube/ovarian carcinoma for which she completed 2 cycles of carboplatin/Taxol, cycle 3 held for one week ago due to complicated medical history including 2 hospitalizations following each cycle of chemotherapy, currently day 7, cycle 3 of every other week Doxil.  2. Newly diagnosed right jugular vein DVT, prior course of  Arixtra 7.5 mg subcutaneous daily, now on Coumadin 7.5mg  daily.  3. Previous history of breast carcinoma, BCRA positive.  Interim History:   Abigail [name] is seen today in followup in anticipation of her third cycle  of every other week Doxil for recurrent fallopian tube/ovarian carcinoma. She continues Coumadin 7.5mg  daily. She denies any bleeding or bruising symptoms. She denies any shortness of breath, chest pain, profound fatigue, no nausea, emesis, diarrhea or constipation issues. She denies any abdominal pain. She has no lingering significant neuropathy, but does note a little bit of numbness and tingling of the fingertips on her right hand which is chronic in nature. Energy level is actually quite good, and overall she feels quite well. A detailed review of systems is otherwise noncontributory as noted below.  Review of Systems: Constitutional:  no weight loss, fever, night sweats, feels well and fatigued Eyes: no complaints ENT: no complaints Cardiovascular: positive for - shortness of breath Respiratory: as above. Neurological: no TIA or stroke symptoms Dermatological: negative Gastrointestinal: no abdominal pain, change in bowel habits, or black or bloody stools Genito-Urinary: no dysuria, trouble voiding, or hematuria Hematological and Lymphatic: negative Breast: negative Musculoskeletal: negative Remaining ROS negative.  Medications:   I have reviewed the patient's current  medications.  Current Outpatient Prescriptions  Medication Sig Dispense Refill  . amLODipine (NORVASC) 5 MG tablet Take 1 tablet (5 mg total) by mouth daily.  30 tablet  11  . Cholecalciferol (VITAMIN D3) 3000 UNITS TABS Take 3,000 Units by mouth daily.       Marland Kitchen dexamethasone (DECADRON) 4 MG tablet Take 2 tablets by mouth daily starting the day after chemotherapy for 2 days. Take with food.  30 tablet  1  . KLOR-CON M20 20 MEQ tablet TAKE 1 TABLET BY MOUTH EVERY DAY  30 tablet  0  . lidocaine-prilocaine (EMLA) cream Apply topically as needed. For PAC  30 g  1  . lisinopril-hydrochlorothiazide (PRINZIDE,ZESTORETIC) 10-12.5 MG per tablet Take 1 tablet by mouth daily.      Marland Kitchen LORazepam (ATIVAN) 0.5 MG tablet Take 1 tablet (0.5 mg total) by mouth every 6 (six) hours as needed (Nausea or vomiting).  30 tablet  0  . ondansetron (ZOFRAN) 8 MG tablet Take 1 tablet two times a day starting the day after chemo for 2 days. Then take 1 tablet two times a day as needed for nausea or vomiting.  30 tablet  1  . prochlorperazine (COMPAZINE) 10 MG tablet Take 1 tablet (10 mg total) by mouth every 6 (six) hours as needed (Nausea or vomiting).  30 tablet  1  . prochlorperazine (COMPAZINE) 25 MG suppository Place 25 mg rectally every 12 (twelve) hours as needed.      . warfarin (COUMADIN) 5 MG tablet Take 1 tablet (5 mg total) by mouth daily. 1.5 tabs po qday, or as directed.  60 tablet  3   No current facility-administered medications for this visit.   Facility-Administered Medications Ordered in Other Visits  Medication Dose Route Frequency Provider Last  Rate Last Dose  . fondaparinux (ARIXTRA) injection 7.5 mg  7.5 mg Subcutaneous Q24H Pierce Crane, MD   7.5 mg at 07/17/11 1327    Allergies:  Allergies  Allergen Reactions  . Lisinopril Cough  . Codeine Itching  . Oxycodone Nausea Only  . Vicodin (Hydrocodone-Acetaminophen) Nausea Only    Physical Exam: Filed Vitals:   09/06/11 0847  BP: 140/89    Pulse: 108  Temp: 98.9 F (37.2 C)    Body mass index is 39.94 kg/(m^2). Weight: 207 lbs. HEENT:  Sclerae anicteric, conjunctivae pink.  Oropharynx clear.  No mucositis or candidiasis.  No evidence of  frank distention over right jugular vein region. Nodes:  No cervical, supraclavicular, or axillary lymphadenopathy palpated.  Breast Exam:  Deferred.  Lungs:  Clear to auscultation bilaterally.  No crackles, rhonchi, or wheezes.   Heart:  Tachycardic rate, but normal rhythm.   Abdomen:  Soft, nontender.  Positive bowel sounds.  No organomegaly or masses palpated.   Musculoskeletal:  No focal spinal tenderness to palpation.  Extremities:  Benign.  She does have evidence of bilateral edema, but no cyanosis.   Skin:  Benign.   Neuro:  Nonfocal, alert and oriented x 3.   Lab Results: Lab Results  Component Value Date   WBC 4.8 09/06/2011   HGB 9.7* 09/06/2011   HCT 29.1* 09/06/2011   MCV 101.8* 09/06/2011   PLT 255 09/06/2011   NEUTROABS 3.3 09/06/2011     Chemistry      Component Value Date/Time   NA 137 08/30/2011 0922   K 3.9 08/30/2011 0922   CL 102 08/30/2011 0922   CO2 23 08/30/2011 0922   BUN 8 08/30/2011 0922   CREATININE 0.70 08/30/2011 0922      Component Value Date/Time   CALCIUM 9.2 08/30/2011 0922   ALKPHOS 100 08/30/2011 0922   AST 18 08/30/2011 0922   ALT 20 08/30/2011 0922   BILITOT 0.3 08/30/2011 1610      Lab Results  Component Value Date   LABCA2 63* 05/04/2011     Assessment:  Abigail [name] is a 56 year old [name] with recurrent fallopian tube/ovarian carcinoma for which she completed 2 cycles of carboplatin/Taxol, cycle 3 held for one week ago due to complicated medical history including 2 hospitalizations following each cycle of chemotherapy.  Currently day 7, cycle 3 of every two week Doxil today  2. Newly diagnosed right jugular vein DVT, initially on  Arixtra 7.5 mg subcutaneous daily, now on Coumadin 7.5mg    3. Moderate anemia, though  the patient is asymptomatic.  4. Previous history of breast carcinoma, BCRA positive.  IMPRESSION:  1. Marked response to therapy. Near complete resolution of  hypermetabolism, as detailed above. Improvement in  thoracoabdominal adenopathy and omental/peritoneal disease.  2. No new or progressive disease.      Plan:  Judia will receive day 7  cycle 3 of every 2 week Doxil.  Recent PET scan shows dramatic response to treatment and is correlated with her normalization of CA 125. Her INR is pending. We will see her next week for initiation of cycle 4 of Doxil. I clinically overall plan would be to continue chemotherapy for period of time and so I we are ensure that she is not progressing. I will likely continue maintenance chemotherapy. I question whether she is a candidate for any further surgery.  Abigail Lowden, md 09/06/2011

## 2011-09-12 ENCOUNTER — Other Ambulatory Visit: Payer: Self-pay | Admitting: Family

## 2011-09-12 DIAGNOSIS — C569 Malignant neoplasm of unspecified ovary: Secondary | ICD-10-CM

## 2011-09-13 ENCOUNTER — Encounter: Payer: Self-pay | Admitting: Family

## 2011-09-13 ENCOUNTER — Other Ambulatory Visit: Payer: Self-pay | Admitting: *Deleted

## 2011-09-13 ENCOUNTER — Ambulatory Visit (HOSPITAL_BASED_OUTPATIENT_CLINIC_OR_DEPARTMENT_OTHER): Payer: Medicare Other

## 2011-09-13 ENCOUNTER — Other Ambulatory Visit: Payer: Medicare Other | Admitting: Lab

## 2011-09-13 ENCOUNTER — Telehealth: Payer: Self-pay | Admitting: *Deleted

## 2011-09-13 ENCOUNTER — Ambulatory Visit (HOSPITAL_BASED_OUTPATIENT_CLINIC_OR_DEPARTMENT_OTHER): Payer: Medicare Other | Admitting: Family

## 2011-09-13 ENCOUNTER — Telehealth: Payer: Self-pay | Admitting: Oncology

## 2011-09-13 VITALS — BP 169/79 | HR 114 | Temp 98.1°F | Resp 20 | Ht 61.0 in | Wt 215.4 lb

## 2011-09-13 DIAGNOSIS — Z1501 Genetic susceptibility to malignant neoplasm of breast: Secondary | ICD-10-CM

## 2011-09-13 DIAGNOSIS — C5700 Malignant neoplasm of unspecified fallopian tube: Secondary | ICD-10-CM

## 2011-09-13 DIAGNOSIS — Z1502 Genetic susceptibility to malignant neoplasm of ovary: Secondary | ICD-10-CM

## 2011-09-13 DIAGNOSIS — D709 Neutropenia, unspecified: Secondary | ICD-10-CM

## 2011-09-13 DIAGNOSIS — Z853 Personal history of malignant neoplasm of breast: Secondary | ICD-10-CM

## 2011-09-13 DIAGNOSIS — O2230 Deep phlebothrombosis in pregnancy, unspecified trimester: Secondary | ICD-10-CM

## 2011-09-13 DIAGNOSIS — D649 Anemia, unspecified: Secondary | ICD-10-CM

## 2011-09-13 DIAGNOSIS — Z5111 Encounter for antineoplastic chemotherapy: Secondary | ICD-10-CM

## 2011-09-13 DIAGNOSIS — C569 Malignant neoplasm of unspecified ovary: Secondary | ICD-10-CM

## 2011-09-13 DIAGNOSIS — I82409 Acute embolism and thrombosis of unspecified deep veins of unspecified lower extremity: Secondary | ICD-10-CM

## 2011-09-13 LAB — CBC WITH DIFFERENTIAL/PLATELET
Basophils Absolute: 0 10*3/uL (ref 0.0–0.1)
EOS%: 0.1 % (ref 0.0–7.0)
HCT: 28 % — ABNORMAL LOW (ref 34.8–46.6)
HGB: 9.3 g/dL — ABNORMAL LOW (ref 11.6–15.9)
LYMPH%: 8.7 % — ABNORMAL LOW (ref 14.0–49.7)
MCH: 32.4 pg (ref 25.1–34.0)
MCV: 97.6 fL (ref 79.5–101.0)
MONO%: 6.7 % (ref 0.0–14.0)
NEUT%: 84.3 % — ABNORMAL HIGH (ref 38.4–76.8)

## 2011-09-13 LAB — PROTIME-INR
INR: 1.4 — ABNORMAL LOW (ref 2.00–3.50)
Protime: 16.8 Seconds — ABNORMAL HIGH (ref 10.6–13.4)

## 2011-09-13 MED ORDER — ONDANSETRON 8 MG/50ML IVPB (CHCC)
8.0000 mg | Freq: Once | INTRAVENOUS | Status: AC
  Administered 2011-09-13: 8 mg via INTRAVENOUS

## 2011-09-13 MED ORDER — HEPARIN SOD (PORK) LOCK FLUSH 100 UNIT/ML IV SOLN
500.0000 [IU] | Freq: Once | INTRAVENOUS | Status: AC | PRN
  Administered 2011-09-13: 500 [IU]
  Filled 2011-09-13: qty 5

## 2011-09-13 MED ORDER — PEGFILGRASTIM INJECTION 6 MG/0.6ML: 6.0000 mg | Freq: Once | SUBCUTANEOUS | Status: DC

## 2011-09-13 MED ORDER — DOXORUBICIN HCL LIPOSOMAL CHEMO INJECTION 2 MG/ML
30.0000 mg/m2 | Freq: Once | INTRAVENOUS | Status: AC
  Administered 2011-09-13: 60 mg via INTRAVENOUS
  Filled 2011-09-13: qty 30

## 2011-09-13 MED ORDER — DEXAMETHASONE SODIUM PHOSPHATE 10 MG/ML IJ SOLN
10.0000 mg | Freq: Once | INTRAMUSCULAR | Status: AC
  Administered 2011-09-13: 10 mg via INTRAVENOUS

## 2011-09-13 MED ORDER — SODIUM CHLORIDE 0.9 % IV SOLN
Freq: Once | INTRAVENOUS | Status: AC
  Administered 2011-09-13: 12:00:00 via INTRAVENOUS

## 2011-09-13 MED ORDER — SODIUM CHLORIDE 0.9 % IJ SOLN
10.0000 mL | INTRAMUSCULAR | Status: DC | PRN
  Administered 2011-09-13: 10 mL
  Filled 2011-09-13: qty 10

## 2011-09-13 NOTE — Telephone Encounter (Signed)
gve the pt her aug 2013 appt calendar 

## 2011-09-13 NOTE — Telephone Encounter (Signed)
Attempted to contact pt to schedule her with Dr Antoine Poche for follow up of her tachycardia and questionable need for Beta Blocker. appt scheduled for 9/12 at 11:15 am.  Will continue to attempt to contact her with appt time.

## 2011-09-13 NOTE — Progress Notes (Signed)
Hematology and Oncology Follow Up Visit  Abigail [name] [id number] 1955/12/17 56 y.o. 09/13/2011    HPI: 56 year old [place] woman with recurrent fallopian tube/ovarian carcinoma for which she completed 2 cycles of carboplatin/Taxol, cycle 3 held for one week ago due to complicated medical history including 2 hospitalizations following each cycle of chemotherapy, currently day 1, cycle 1 of every other week Doxil.  2. Right jugular vein DVT, prior course of  Arixtra 7.5 mg subcutaneous daily, now on Coumadin 7.5 mg daily.  3. Previous history of breast carcinoma, left breast, April 2009.  4. BCRA positive  Interim History:   Here for day 1, cycle 4 of every other week Doxil for recurrent fallopian tube/ovarian carcinoma. Continues Coumadin 7.5mg  daily.Has been on 10 mg/day in the past. Denies bleeding or bruising symptoms. No headache or blurred vision. No cough or shortness of breath. No abdominal pain or new bone pain. Bowel and bladder function are normal. Appetite is good, with adequate fluid intake.  No profound fatigue, no nausea or vomiting. No lingering significant neuropathy, mild numbness and tingling of the fingertips on her right hand, chronic. Energy level is actually quite good, and overall she feels quite well. Remainder of the 10 point  review of systems is negative.  Medications:   I have reviewed the patient's current medications.  Allergies:  Allergies  Allergen Reactions  . Lisinopril Cough  . Codeine Itching  . Oxycodone Nausea Only  . Vicodin (Hydrocodone-Acetaminophen) Nausea Only    Physical Exam: Filed Vitals:   09/13/11 1107  BP: 169/79  Pulse: 114  Temp: 98.1 F (36.7 C)  Resp: 20    Body mass index is 40.70 kg/(m^2). Weight: 207 lbs. HEENT:  Sclerae anicteric, conjunctivae pink.  Oropharynx clear.  No mucositis or candidiasis.  No evidence of  frank distention over right jugular vein region. Nodes:  No cervical, supraclavicular, or  axillary lymphadenopathy palpated.  Lungs:  Clear to auscultation bilaterally.  No crackles, rhonchi, or wheezes.   Heart:  Tachycardic rate, but normal rhythm.   Abdomen:  Obese, soft, nontender.  Positive bowel sounds.  No organomegaly or masses palpated.  No fluid wave of ascites.  Musculoskeletal:  No focal spinal tenderness to palpation.  Extremities:   1+ pitting edema bilateral lower extremities. No cyanosis.  No upper extremity edema Skin:  Benign.   Neuro:  Nonfocal, alert and oriented x 3.  Lab Results: Lab Results  Component Value Date   WBC 11.5* 09/13/2011   HGB 9.3* 09/13/2011   HCT 28.0* 09/13/2011   MCV 97.6 09/13/2011   PLT 256 09/13/2011   NEUTROABS 9.7* 09/13/2011     Chemistry      Component Value Date/Time   NA 138 09/06/2011 0827   K 3.8 09/06/2011 0827   CL 100 09/06/2011 0827   CO2 29 09/06/2011 0827   BUN 14 09/06/2011 0827   CREATININE 0.76 09/06/2011 0827      Component Value Date/Time   CALCIUM 8.7 09/06/2011 0827   ALKPHOS 94 09/06/2011 0827   AST 18 09/06/2011 0827   ALT 26 09/06/2011 0827   BILITOT 0.4 09/06/2011 0827      Assessment:  1. Abigail [name] is a 56 year old [name] with recurrent fallopian tube/ovarian carcinoma for which she completed 2 cycles of carboplatin/Taxol, cycle 3 held for one week ago due to complicated medical history including two hospitalizations following each cycle of chemotherapy.  Currently day 1, cycle 4 of every two week Doxil today.  2. Right jugular vein DVT, initially on  Arixtra 7.5 mg subcutaneous daily, now on Coumadin 7.5 mg daily.    3. Moderate anemia, asymptomatic. Hgb. 9.3  4. Marked response to therapy with normalization of CA125. Today's lab pending.   5. Subtherapeutic INR, 1.40   Plan:  1. Abigail [name] will receive day 1 cycle 4 of every 2 week Doxil today.  2. Dr. Donnie Coffin has plans for 2 add'l cycles and then convert to some sort of maintenance therapy. Plan will be finalized after he confers with GYN  oncology.  3. She will take 10 mg Coumadin today, then resume 7.5 mg and we will recheck INR in 1 week.     Colman Cater, FNP-C   Addendum: She has appt with GYN oncology 8/21. Per Dr. Donnie Coffin, we will tentatively plan 2 add'l cycles of chemo (every 2 weeks) awaiting GYN oncology's formal recommendations.

## 2011-09-13 NOTE — Patient Instructions (Signed)
Patient aware of next appointment; discharged home with no complaints. 

## 2011-09-13 NOTE — Patient Instructions (Signed)
1. Day 1 cycle 4 of every 2 week Doxil today.  2. Dr. Donnie Coffin has plans for 2 add'l cycles and then convert to some sort of maintenance therapy. Plan will be finalized after he confers with GYN oncology.  3. 10 mg Coumadin today, then resume 7.5 mg and we will recheck INR in 1 week.

## 2011-09-14 NOTE — Telephone Encounter (Signed)
She aware and will come to appt as scheduled

## 2011-09-20 ENCOUNTER — Other Ambulatory Visit (HOSPITAL_BASED_OUTPATIENT_CLINIC_OR_DEPARTMENT_OTHER): Payer: Medicare Other | Admitting: Lab

## 2011-09-20 ENCOUNTER — Encounter: Payer: Self-pay | Admitting: Family

## 2011-09-20 ENCOUNTER — Ambulatory Visit (HOSPITAL_BASED_OUTPATIENT_CLINIC_OR_DEPARTMENT_OTHER): Payer: Medicare Other | Admitting: Family

## 2011-09-20 ENCOUNTER — Ambulatory Visit (HOSPITAL_BASED_OUTPATIENT_CLINIC_OR_DEPARTMENT_OTHER): Payer: Medicare Other

## 2011-09-20 DIAGNOSIS — C569 Malignant neoplasm of unspecified ovary: Secondary | ICD-10-CM

## 2011-09-20 DIAGNOSIS — E860 Dehydration: Secondary | ICD-10-CM

## 2011-09-20 DIAGNOSIS — C50919 Malignant neoplasm of unspecified site of unspecified female breast: Secondary | ICD-10-CM

## 2011-09-20 DIAGNOSIS — I82C19 Acute embolism and thrombosis of unspecified internal jugular vein: Secondary | ICD-10-CM

## 2011-09-20 DIAGNOSIS — D709 Neutropenia, unspecified: Secondary | ICD-10-CM

## 2011-09-20 DIAGNOSIS — R5081 Fever presenting with conditions classified elsewhere: Secondary | ICD-10-CM

## 2011-09-20 DIAGNOSIS — R110 Nausea: Secondary | ICD-10-CM

## 2011-09-20 DIAGNOSIS — I82409 Acute embolism and thrombosis of unspecified deep veins of unspecified lower extremity: Secondary | ICD-10-CM

## 2011-09-20 DIAGNOSIS — O2230 Deep phlebothrombosis in pregnancy, unspecified trimester: Secondary | ICD-10-CM

## 2011-09-20 DIAGNOSIS — D649 Anemia, unspecified: Secondary | ICD-10-CM

## 2011-09-20 DIAGNOSIS — C5700 Malignant neoplasm of unspecified fallopian tube: Secondary | ICD-10-CM

## 2011-09-20 LAB — CBC WITH DIFFERENTIAL/PLATELET
Eosinophils Absolute: 0.1 10*3/uL (ref 0.0–0.5)
HCT: 31.6 % — ABNORMAL LOW (ref 34.8–46.6)
LYMPH%: 17.3 % (ref 14.0–49.7)
MCHC: 33.5 g/dL (ref 31.5–36.0)
MCV: 98.4 fL (ref 79.5–101.0)
MONO#: 0.6 10*3/uL (ref 0.1–0.9)
NEUT#: 4.6 10*3/uL (ref 1.5–6.5)
NEUT%: 72 % (ref 38.4–76.8)
Platelets: 239 10*3/uL (ref 145–400)
WBC: 6.4 10*3/uL (ref 3.9–10.3)

## 2011-09-20 MED ORDER — ONDANSETRON 8 MG/50ML IVPB (CHCC)
8.0000 mg | Freq: Once | INTRAVENOUS | Status: AC
  Administered 2011-09-20: 8 mg via INTRAVENOUS

## 2011-09-20 MED ORDER — SODIUM CHLORIDE 0.9 % IV SOLN
Freq: Once | INTRAVENOUS | Status: AC
  Administered 2011-09-20: 12:00:00 via INTRAVENOUS

## 2011-09-20 NOTE — Progress Notes (Signed)
Hematology and Oncology Follow Up Visit  Abigail [name] [id number] [birth year] 56 y.o. 09/20/2011    HPI: 57 year old [place] woman with recurrent fallopian tube/ovarian carcinoma for which she completed 2 cycles of carboplatin/Taxol, cycle 3 held for one week ago due to complicated medical history including 2 hospitalizations following each cycle of chemotherapy, currently day 1, cycle 1 of every other week Doxil.  2. Right jugular vein DVT, prior course of  Arixtra 7.5 mg subcutaneous daily, now on Coumadin 7.5 mg daily.  3. Previous history of breast carcinoma, left breast, April 2009.  4. BCRA positive  Interim History:   Here for lab check after Doxil 09/13/11. Feels poorly today, no energy and dyspneic on exertion. Continues Coumadin 7.5mg  daily. Has been on 10 mg/day in the past. Denies bleeding or bruising symptoms. No headache or blurred vision. No cough. No abdominal pain or new bone pain. Bowel and bladder function are normal. Appetite is good, with adequate fluid intake.  Mild nausea, no vomiting. No lingering significant neuropathy, mild numbness and tingling of the fingertips on her right hand, chronic. Remainder of the 10 point  review of systems is negative.  Has appt with gyn oncology at the request of Dr. Donnie Coffin to discuss surgical options.   Medications:   I have reviewed the patient's current medications.  Allergies:  Allergies  Allergen Reactions  . Lisinopril Cough  . Codeine Itching  . Oxycodone Nausea Only  . Vicodin (Hydrocodone-Acetaminophen) Nausea Only    Physical Exam: There were no vitals filed for this visit.  There is no height or weight on file to calculate BMI. Weight: 207 lbs. HEENT:  Sclerae anicteric, conjunctivae pink.  Oropharynx clear.  No mucositis or candidiasis.  No evidence of  frank distention over right jugular vein region. Nodes:  No cervical, supraclavicular, or axillary lymphadenopathy palpated.  Lungs:  Clear to  auscultation bilaterally.  No crackles, rhonchi, or wheezes.   Heart:  Tachycardic rate, normal rhythm.   Abdomen:  Obese, soft, nontender.  Positive bowel sounds.  No organomegaly or masses palpated.  No fluid wave of ascites.  Musculoskeletal:  No focal spinal tenderness to palpation.  Extremities:   1+ pitting edema bilateral lower extremities. No cyanosis.  No upper extremity edema Skin:  Benign.   Neuro:  Nonfocal, alert and oriented x 3.  Lab Results: Lab Results  Component Value Date   WBC 6.4 09/20/2011   HGB 10.6* 09/20/2011   HCT 31.6* 09/20/2011   MCV 98.4 09/20/2011   PLT 239 09/20/2011   NEUTROABS 4.6 09/20/2011     Chemistry      Component Value Date/Time   NA 138 09/06/2011 0827   K 3.8 09/06/2011 0827   CL 100 09/06/2011 0827   CO2 29 09/06/2011 0827   BUN 14 09/06/2011 0827   CREATININE 0.76 09/06/2011 0827      Component Value Date/Time   CALCIUM 8.7 09/06/2011 0827   ALKPHOS 94 09/06/2011 0827   AST 18 09/06/2011 0827   ALT 26 09/06/2011 0827   BILITOT 0.4 09/06/2011 0827      Assessment:  1. Abigail [name] is a 56 year old [name] with recurrent fallopian tube/ovarian carcinoma for which she completed 2 cycles of carboplatin/Taxol, cycle 3 held for one week ago due to complicated medical history including two hospitalizations following each cycle of chemotherapy.  Receives Doxil, given every two week.  2. Right jugular vein DVT, initially on  Arixtra 7.5 mg subcutaneous daily, now on Coumadin  7.5 mg daily.    3. Marked response to therapy with normalization of CA125.   4. Therapeutic INR, 2.90.  5. Tachycardia and nausea, related to dehydration.   Plan:  1. Appt with GYN oncology 8/21.  2. 2 add'l cycles of chemo planned for 8/22 and 9/5 and then convert to some sort of maintenance therapy. Plan will be finalized after she confers with GYN oncology.  3. Continue Coumadin 7.5 mg and we will recheck INR at next visit 8/22.  4. Fluids today with  antiemetic.     Drayk Humbarger, FNP-C

## 2011-09-20 NOTE — Patient Instructions (Addendum)
Keep appt with gyn oncology 8/21.  Return for next treatment 8/22.  Stay on coumadin 7.5 daily.  Fluids today.

## 2011-09-20 NOTE — Patient Instructions (Signed)
Dehydration, Adult Dehydration means your body does not have as much fluid as it needs. Your kidneys, brain, and heart will not work properly without the right amount of fluids and salt.  HOME CARE  Ask your doctor how to replace body fluid losses (rehydrate).   Drink enough fluids to keep your pee (urine) clear or pale yellow.   Drink small amounts of fluids often if you feel sick to your stomach (nauseous) or throw up (vomit).   Eat like you normally do.   Avoid:   Foods or drinks high in sugar.   Bubbly (carbonated) drinks.   Juice.   Very hot or cold fluids.   Drinks with caffeine.   Fatty, greasy foods.   Alcohol.   Tobacco.   Eating too much.   Gelatin desserts.   Wash your hands to avoid spreading germs (bacteria, viruses).   Only take medicine as told by your doctor.   Keep all doctor visits as told.  GET HELP RIGHT AWAY IF:   You cannot drink something without throwing up.   You get worse even with treatment.   Your vomit has blood in it or looks greenish.   Your poop (stool) has blood in it or looks black and tarry.   You have not peed in 6 to 8 hours.   You pee a small amount of very dark pee.   You have a fever.   You pass out (faint).   You have belly (abdominal) pain that gets worse or stays in one spot (localizes).   You have a rash, stiff neck, or bad headache.   You get easily annoyed, sleepy, or are hard to wake up.   You feel weak, dizzy, or very thirsty.  MAKE SURE YOU:   Understand these instructions.   Will watch your condition.   Will get help right away if you are not doing well or get worse.  Document Released: 11/18/2008 Document Revised: 01/11/2011 Document Reviewed: 09/11/2010 [employer] Patient Information 2012 [employer] 

## 2011-09-21 ENCOUNTER — Telehealth: Payer: Self-pay | Admitting: *Deleted

## 2011-09-21 NOTE — Telephone Encounter (Signed)
moved patient appointment to 10-04-2011 to 10-03-2011 starting at 10:15am patient confirmed overt the phone

## 2011-09-26 ENCOUNTER — Ambulatory Visit: Payer: Medicare Other | Attending: Gynecologic Oncology | Admitting: Gynecologic Oncology

## 2011-09-26 ENCOUNTER — Encounter: Payer: Self-pay | Admitting: Gynecologic Oncology

## 2011-09-26 VITALS — BP 108/66 | HR 88 | Temp 98.5°F | Resp 20 | Ht 62.05 in | Wt 218.4 lb

## 2011-09-26 DIAGNOSIS — Z1501 Genetic susceptibility to malignant neoplasm of breast: Secondary | ICD-10-CM | POA: Insufficient documentation

## 2011-09-26 DIAGNOSIS — K7689 Other specified diseases of liver: Secondary | ICD-10-CM | POA: Insufficient documentation

## 2011-09-26 DIAGNOSIS — C5700 Malignant neoplasm of unspecified fallopian tube: Secondary | ICD-10-CM | POA: Insufficient documentation

## 2011-09-26 DIAGNOSIS — R599 Enlarged lymph nodes, unspecified: Secondary | ICD-10-CM | POA: Insufficient documentation

## 2011-09-26 DIAGNOSIS — Z90710 Acquired absence of both cervix and uterus: Secondary | ICD-10-CM | POA: Insufficient documentation

## 2011-09-26 DIAGNOSIS — I10 Essential (primary) hypertension: Secondary | ICD-10-CM | POA: Insufficient documentation

## 2011-09-26 DIAGNOSIS — Z86718 Personal history of other venous thrombosis and embolism: Secondary | ICD-10-CM | POA: Insufficient documentation

## 2011-09-26 DIAGNOSIS — C482 Malignant neoplasm of peritoneum, unspecified: Secondary | ICD-10-CM | POA: Insufficient documentation

## 2011-09-26 DIAGNOSIS — Z9079 Acquired absence of other genital organ(s): Secondary | ICD-10-CM | POA: Insufficient documentation

## 2011-09-26 DIAGNOSIS — Z7901 Long term (current) use of anticoagulants: Secondary | ICD-10-CM | POA: Insufficient documentation

## 2011-09-26 DIAGNOSIS — Z87891 Personal history of nicotine dependence: Secondary | ICD-10-CM | POA: Insufficient documentation

## 2011-09-26 DIAGNOSIS — E669 Obesity, unspecified: Secondary | ICD-10-CM | POA: Insufficient documentation

## 2011-09-26 DIAGNOSIS — Z853 Personal history of malignant neoplasm of breast: Secondary | ICD-10-CM | POA: Insufficient documentation

## 2011-09-26 DIAGNOSIS — K219 Gastro-esophageal reflux disease without esophagitis: Secondary | ICD-10-CM | POA: Insufficient documentation

## 2011-09-26 DIAGNOSIS — Z79899 Other long term (current) drug therapy: Secondary | ICD-10-CM | POA: Insufficient documentation

## 2011-09-26 NOTE — Progress Notes (Signed)
Consult Note: Gyn-Onc  Abigail [name] 56 y.o. female  CC:  Chief Complaint  Patient presents with  . 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 She was also diagnosed with a stage III C. fallopian tube carcinoma in 2009 and underwent appropriate surgery followed by 6 cycles of paclitaxel and carboplatin. She has been free of disease from the prospective since that time. I last saw her in June of 2012 which time her exam was unremarkable and there's he went to 56 was negative. She is CA 125 September 4 which was 11.5. Dr. Donnie Coffin ordered a CT scan in September 2012 which revealed: No evidence of disease progression within the chest. Within the abdomen and pelvis she had essentially stable retroperitoneal para-aortic lymphadenopathy. Right aortocaval node measured 16 mm as compared to 13 mm on prior.   She had a CA 125 December 19 that was 52.6 and a repeat on April 8 that was 123.4. She underwent a PET CT on April 10 and comes in today for the results. She is also seeing Dr. Donnie Coffin later today. Within the neck there are no hypermetabolic lymph nodes. Within the chest there was an 8 mm moderately FDG avid lymph node. There was retrocrural adenopathy that was FDG avid including a 10 mm short axis lymph node on the left and a 9 mm short axis lymph node on the right. Within the abdomen and pelvis were several 1.1-1.6 gastrohepatic, (, and aortocaval nodes all FDG avid. There was widespread peritoneal metastases including a 1.8 x 2.1 cm nodule on the anterior left hepatic dome, it 0.6 x 1.7 cm nodule in the inferior right hepatic lobe, multiple omental nodules including a 2.0 x 2.5 cm nodule below the left anterior abdominal wall, a 2.5 x 3.9 cm nodule in the right abdomen near  the tip of the appendix, and multiple pelvic nodules measuring 1.8 x 1.6 cm.  We discussed this and she was very tearful understandably. Since our last visit 05/29/11, she has been receiving chemotherapy under the care of Dr. Donnie Coffin.  Interval History: She had completed 2 cycles of paclitaxel and carboplatin. Cycle #3 had been held secondary to 2 hospitalizations follow each cycle of chemotherapy. She was diagnosed with a right jugular vein DVT and was on Arixtra and currently on Coumadin. She is currently on Doxil every other week. And has been tolerating it much better. Her CA 125 had responded very nicely. Her CA 125 peak 123 April 8 and most recently was 8.6 on August 1. July 31 she had a PET/CT performed. It revealed field no hypermetabolic areas within the chest. Within the abdomen and pelvis there was resolution of the majority of the hypermetabolism within the abdomen and pelvis. Post retrocaval node measuring 8 mm a prior measured 1.2 cm. She had a retrosternal node that previously measured 11 mm and currently measured 7 mm which was improved. Her retrocrural adenopathy was improved and she had an improvement in her peritoneal and omental disease. We reviewed the CT PET findings with the patient By line and she was very happy.   Review of Systems: She denies any chest pain, shortness of breath, nausea, fevers, chills, an intentional weight loss, or weight gain. She has any chest pain or shortness  of breath. 10 point review of systems otherwise negative.  She has some darkening of the skin of her hands but no peeling or pain. Essentially grade 0 PPE. She has some occasional nausea in the morning which is better with either eating something or taking her nausea medication. She does occasionally have some constipation. If she is constipated for a few days she'll then have a bit of residual diarrhea. She also complains that she voids often at night. When questionned about that, she tends to drink water  throughout the evening and we discussed strategies to stop drinking as much water and that'll help her not void as much at night.  Current Meds:  Outpatient Encounter Prescriptions as of 05/24/2011  Medication Sig Dispense Refill  . amLODipine (NORVASC) 10 MG tablet TAKE 1 TABLET EVERY DAY  30 tablet  4  . Cholecalciferol (VITAMIN D3) 3000 UNITS TABS Take by mouth daily.       Marland Kitchen lisinopril-hydrochlorothiazide (PRINZIDE,ZESTORETIC) 10-12.5 MG per tablet TAKE 1 TABLET BY MOUTH DAILY  30 tablet  7   No facility-administered encounter medications on file as of 05/24/2011.    Allergy:  Allergies  Allergen Reactions  . Codeine Itching    Social Hx:   History   Social History  . Marital Status: Divorced    Spouse Name: N/A    Number of Children: N/A  . Years of Education: N/A   Occupational History  . Not on file.   Social History Main Topics  . Smoking status: Former Smoker    Types: Cigarettes    Quit date: 08/07/2006  . Smokeless tobacco: Not on file  . Alcohol Use: 0.0 oz/week     occas  . Drug Use:   . Sexually Active:    Other Topics Concern  . Not on file   Social History Narrative  . No narrative on file    Past Surgical Hx:  Past Surgical History  Procedure Date  . Tonsillectomy and adenoidectomy   . Breast lumpectomy   . Abdominal hysterectomy     TAH, BSO    Past Medical Hx:  Past Medical History  Diagnosis Date  . Hypertension   . Obesity   . Diverticulitis   . Hemorrhoids   . Colonic polyp   . GERD (gastroesophageal reflux disease)   . Cancer     BREAST, FALLOPIAN TUBE,    Family Hx:  Family History  Problem Relation Age of Onset  . Cancer Mother   . Hypertension Mother   . Diabetes Father   . Heart disease Father   . Hypertension Father   . Stroke Father   . Hypertension Sister   . Hypertension Brother   . Hypertension Paternal Aunt   . Diabetes Paternal Aunt   . Hypertension Paternal Uncle   . Diabetes Paternal Uncle   .  Hypertension Paternal Grandmother   . Hypertension Paternal Grandfather     Vitals:   Physical Exam:  Well-nourished well-developed female in no acute distress.  Neck: Supple, no lymphadenopathy no thyromegaly.  Lungs: Clear to auscultation bilaterally.  Cardiovascular: Regular rate and rhythm.  Abdomen: Obese, soft, nontender, nondistended. There is a well-healed vertical midline incision. There is no evidence of an incisional hernia. There's no palpable masses, exam is limited by habitus.  Groins: No lymphadenopathy.  Extremities: Trace to 1+ edema equal bilaterally.  Pelvic: No masses or nodularity. Rectal confirms.    Assessment/Plan: 56 year old with history of breast cancer BRCA positivity as well as a  history of IIIc fallopian tube carcinoma 6/09. She unfortunately had a recurrence of her disease 4/13. She was initially diagnosed in the early part of 2009 and is platinum and taxanes sensitive. There were no protocols which she is eligible for secondary to her history of breast cancer in 2010.   Both by CA 125 and PET scan imaging she has had a very nice response to treatment. She initially had paclitaxel and carboplatin but secondary to tolerance and medical complication she was switched to liposomal doxorubicin which she is tolerating quite well. We had a discussion today regarding her PET scan and she was shown line by line the improvement and was given a graph of her CA 125s. Discussion was held with the patient today regarding chemotherapy cycles after her looking like she's had a dramatic response to continue chemotherapy. She states that Dr. Donnie Coffin would like to do 2 more cycles of liposomal doxorubicin which I think is reasonable. At the completion of that, pending his thoughts, we would proceed with repeat PET scan imaging and if that is continuing to look reassuring and her CA 125 is normal she'll enter close followup. We will not schedule an appointment for her today.  We will  ask Dr. Donnie Coffin will contact us when she has completed her therapy so that we can schedule the appropriate follow up.  Cleda Mccreedy A., MD 05/24/2011, 4:34 PM

## 2011-09-26 NOTE — Patient Instructions (Signed)
Follow up for chemotherapy.

## 2011-09-27 ENCOUNTER — Ambulatory Visit (HOSPITAL_BASED_OUTPATIENT_CLINIC_OR_DEPARTMENT_OTHER): Payer: Medicare Other | Admitting: Family

## 2011-09-27 ENCOUNTER — Emergency Department (HOSPITAL_COMMUNITY)
Admission: EM | Admit: 2011-09-27 | Discharge: 2011-09-27 | Disposition: A | Discharge status: Home / Self Care | Payer: Medicare Other | Attending: Emergency Medicine | Admitting: Emergency Medicine

## 2011-09-27 ENCOUNTER — Ambulatory Visit: Payer: Medicare Other

## 2011-09-27 ENCOUNTER — Telehealth: Payer: Self-pay | Admitting: Oncology

## 2011-09-27 ENCOUNTER — Telehealth: Payer: Self-pay | Admitting: *Deleted

## 2011-09-27 ENCOUNTER — Encounter: Payer: Self-pay | Admitting: Family

## 2011-09-27 ENCOUNTER — Ambulatory Visit (HOSPITAL_COMMUNITY)
Admission: RE | Admit: 2011-09-27 | Discharge: 2011-09-27 | Disposition: A | Discharge status: Home / Self Care | Payer: Medicare Other | Source: Ambulatory Visit | Attending: Family | Admitting: Family

## 2011-09-27 ENCOUNTER — Other Ambulatory Visit (HOSPITAL_BASED_OUTPATIENT_CLINIC_OR_DEPARTMENT_OTHER): Payer: Medicare Other | Admitting: Lab

## 2011-09-27 ENCOUNTER — Encounter (HOSPITAL_COMMUNITY): Payer: Self-pay | Admitting: Emergency Medicine

## 2011-09-27 VITALS — BP 138/89 | HR 120 | Temp 99.1°F | Resp 20 | Ht 62.05 in | Wt 220.5 lb

## 2011-09-27 DIAGNOSIS — Z888 Allergy status to other drugs, medicaments and biological substances status: Secondary | ICD-10-CM | POA: Insufficient documentation

## 2011-09-27 DIAGNOSIS — C5700 Malignant neoplasm of unspecified fallopian tube: Secondary | ICD-10-CM

## 2011-09-27 DIAGNOSIS — R0989 Other specified symptoms and signs involving the circulatory and respiratory systems: Secondary | ICD-10-CM | POA: Insufficient documentation

## 2011-09-27 DIAGNOSIS — Z1501 Genetic susceptibility to malignant neoplasm of breast: Secondary | ICD-10-CM

## 2011-09-27 DIAGNOSIS — R0600 Dyspnea, unspecified: Secondary | ICD-10-CM

## 2011-09-27 DIAGNOSIS — K219 Gastro-esophageal reflux disease without esophagitis: Secondary | ICD-10-CM | POA: Insufficient documentation

## 2011-09-27 DIAGNOSIS — Z87891 Personal history of nicotine dependence: Secondary | ICD-10-CM | POA: Insufficient documentation

## 2011-09-27 DIAGNOSIS — Z86718 Personal history of other venous thrombosis and embolism: Secondary | ICD-10-CM

## 2011-09-27 DIAGNOSIS — O2230 Deep phlebothrombosis in pregnancy, unspecified trimester: Secondary | ICD-10-CM

## 2011-09-27 DIAGNOSIS — Z8543 Personal history of malignant neoplasm of ovary: Secondary | ICD-10-CM | POA: Insufficient documentation

## 2011-09-27 DIAGNOSIS — R509 Fever, unspecified: Secondary | ICD-10-CM

## 2011-09-27 DIAGNOSIS — J189 Pneumonia, unspecified organism: Secondary | ICD-10-CM

## 2011-09-27 DIAGNOSIS — R0609 Other forms of dyspnea: Secondary | ICD-10-CM | POA: Insufficient documentation

## 2011-09-27 DIAGNOSIS — J984 Other disorders of lung: Secondary | ICD-10-CM | POA: Insufficient documentation

## 2011-09-27 DIAGNOSIS — Z7901 Long term (current) use of anticoagulants: Secondary | ICD-10-CM | POA: Insufficient documentation

## 2011-09-27 DIAGNOSIS — R918 Other nonspecific abnormal finding of lung field: Secondary | ICD-10-CM

## 2011-09-27 DIAGNOSIS — Z885 Allergy status to narcotic agent status: Secondary | ICD-10-CM | POA: Insufficient documentation

## 2011-09-27 DIAGNOSIS — I10 Essential (primary) hypertension: Secondary | ICD-10-CM | POA: Insufficient documentation

## 2011-09-27 DIAGNOSIS — M47814 Spondylosis without myelopathy or radiculopathy, thoracic region: Secondary | ICD-10-CM | POA: Insufficient documentation

## 2011-09-27 DIAGNOSIS — I82C19 Acute embolism and thrombosis of unspecified internal jugular vein: Secondary | ICD-10-CM

## 2011-09-27 DIAGNOSIS — J849 Interstitial pulmonary disease, unspecified: Secondary | ICD-10-CM

## 2011-09-27 DIAGNOSIS — J8410 Pulmonary fibrosis, unspecified: Secondary | ICD-10-CM | POA: Insufficient documentation

## 2011-09-27 DIAGNOSIS — C569 Malignant neoplasm of unspecified ovary: Secondary | ICD-10-CM

## 2011-09-27 DIAGNOSIS — R0602 Shortness of breath: Secondary | ICD-10-CM | POA: Insufficient documentation

## 2011-09-27 DIAGNOSIS — Z79899 Other long term (current) drug therapy: Secondary | ICD-10-CM | POA: Insufficient documentation

## 2011-09-27 DIAGNOSIS — Z853 Personal history of malignant neoplasm of breast: Secondary | ICD-10-CM | POA: Insufficient documentation

## 2011-09-27 LAB — CBC & DIFF AND RETIC
Eosinophils Absolute: 0 10*3/uL (ref 0.0–0.5)
MCV: 97.5 fL (ref 79.5–101.0)
MONO%: 13.4 % (ref 0.0–14.0)
NEUT#: 3.4 10*3/uL (ref 1.5–6.5)
RBC: 2.84 10*6/uL — ABNORMAL LOW (ref 3.70–5.45)
RDW: 17 % — ABNORMAL HIGH (ref 11.2–14.5)
Retic %: 2.73 % — ABNORMAL HIGH (ref 0.70–2.10)
WBC: 5.2 10*3/uL (ref 3.9–10.3)
nRBC: 0 % (ref 0–0)

## 2011-09-27 LAB — PROTIME-INR
INR: 1.7 — ABNORMAL LOW (ref 2.00–3.50)
Protime: 20.4 Seconds — ABNORMAL HIGH (ref 10.6–13.4)

## 2011-09-27 LAB — LACTIC ACID, PLASMA: Lactic Acid, Venous: 1.4 mmol/L (ref 0.5–2.2)

## 2011-09-27 MED ORDER — ACETAMINOPHEN 325 MG PO TABS
650.0000 mg | ORAL_TABLET | Freq: Once | ORAL | Status: AC
  Administered 2011-09-27: 650 mg via ORAL
  Filled 2011-09-27: qty 2

## 2011-09-27 MED ORDER — LEVOFLOXACIN 500 MG PO TABS: 500.0000 mg | ORAL_TABLET | Freq: Every day | ORAL | Status: AC

## 2011-09-27 MED ORDER — IOHEXOL 350 MG/ML SOLN
100.0000 mL | Freq: Once | INTRAVENOUS | Status: AC | PRN
  Administered 2011-09-27: 100 mL via INTRAVENOUS

## 2011-09-27 MED ORDER — METHYLPREDNISOLONE 4 MG PO KIT: PACK | ORAL | Status: AC

## 2011-09-27 NOTE — Telephone Encounter (Signed)
Per staff message and POF I have scheduled appts.  JMW  

## 2011-09-27 NOTE — ED Provider Notes (Signed)
History     CSN: [id number]  Arrival date & time 09/27/11  2110   First MD Initiated Contact with Patient 09/27/11 2133      Chief Complaint  Patient presents with  . Fever     HPI Pt states she took a nap this evening and when she woke she felt hot so she took her temp and it was 101.3. Pt states she was seen by her PCP earlier today and was told she may have pneumonia and was started on some antibiotics levofloxacin 500mg  once a day and methyprednisone 4mg  take as directed Pt states she has had one dose of each today patient had not taken any Tylenol or ibuprofen prior to arriving.  Past Medical History  Diagnosis Date  . Hypertension   . Obesity   . Diverticulitis   . Hemorrhoids   . Colonic polyp   . GERD (gastroesophageal reflux disease)   . Cancer     BREAST, FALLOPIAN TUBE,  . Blood clot of neck vein     Past Surgical History  Procedure Date  . Tonsillectomy and adenoidectomy   . Breast lumpectomy   . Abdominal hysterectomy     TAH, BSO  . Port a cath     05/29/11    Family History  Problem Relation Age of Onset  . Cancer Mother   . Hypertension Mother   . Diabetes Father   . Heart disease Father 79  . Hypertension Father   . Stroke Father   . Hypertension Sister   . Hypertension Brother   . Hypertension Paternal Aunt   . Diabetes Paternal Aunt   . Hypertension Paternal Uncle   . Diabetes Paternal Uncle   . Hypertension Paternal Grandmother   . Hypertension Paternal Grandfather     History  Substance Use Topics  . Smoking status: Former Smoker -- 0.5 packs/day for 7 years    Types: Cigarettes    Quit date: 08/07/2006  . Smokeless tobacco: Never Used  . Alcohol Use: No     occas    OB History    Grav Para Term Preterm Abortions TAB SAB Ect Mult Living                  Review of Systems  All other systems reviewed and are negative.    Allergies  Lisinopril; Codeine; Oxycodone; and Vicodin  Home Medications   Current Outpatient Rx   Name Route Sig Dispense Refill  . AMLODIPINE BESYLATE 5 MG PO TABS Oral Take 1 tablet (5 mg total) by mouth daily. 30 tablet 11  . VITAMIN D3 3000 UNITS PO TABS Oral Take 3,000 Units by mouth daily.     Marland Kitchen DEXAMETHASONE 4 MG PO TABS  Take 2 tablets by mouth daily starting the day after chemotherapy for 2 days. Take with food. 30 tablet 1  . KLOR-CON M20 20 MEQ PO TBCR  TAKE 1 TABLET BY MOUTH EVERY DAY 30 tablet 0  . LEVOFLOXACIN 500 MG PO TABS Oral Take 1 tablet (500 mg total) by mouth daily. 7 tablet 0  . LIDOCAINE-PRILOCAINE 2.5-2.5 % EX CREA Topical Apply topically as needed. For PAC 30 g 1  . LISINOPRIL-HYDROCHLOROTHIAZIDE 10-12.5 MG PO TABS Oral Take 1 tablet by mouth daily.    Marland Kitchen LORAZEPAM 0.5 MG PO TABS Oral Take 1 tablet (0.5 mg total) by mouth every 6 (six) hours as needed (Nausea or vomiting). 30 tablet 0  . METHYLPREDNISOLONE 4 MG PO KIT  follow package directions  21 tablet 0  . ONDANSETRON HCL 8 MG PO TABS  Take 1 tablet two times a day starting the day after chemo for 2 days. Then take 1 tablet two times a day as needed for nausea or vomiting. 30 tablet 1  . PROCHLORPERAZINE MALEATE 10 MG PO TABS Oral Take 1 tablet (10 mg total) by mouth every 6 (six) hours as needed (Nausea or vomiting). 30 tablet 1  . PROCHLORPERAZINE 25 MG RE SUPP Rectal Place 25 mg rectally every 12 (twelve) hours as needed.    . WARFARIN SODIUM 5 MG PO TABS Oral Take 7.5 mg by mouth daily. 1.5 tabs everyday      BP 160/100  Pulse 135  Temp 99.9 F (37.7 C) (Oral)  Resp 18  SpO2 95%  Physical Exam  Nursing note and vitals reviewed. Constitutional: She is oriented to person, place, and time. She appears well-developed and well-nourished. She does not appear ill. No distress.  HENT:  Head: Normocephalic and atraumatic.  Eyes: Pupils are equal, round, and reactive to light.  Neck: Normal range of motion.  Cardiovascular: Intact distal pulses.  Tachycardia present.   Pulmonary/Chest: No accessory muscle  usage. Not tachypneic. No respiratory distress. She has no rales.  Abdominal: Normal appearance. She exhibits no distension.  Musculoskeletal: Normal range of motion.  Neurological: She is alert and oriented to person, place, and time. No cranial nerve deficit.  Skin: Skin is warm and dry. No rash noted. She is not diaphoretic.  Psychiatric: She has a normal mood and affect. Her behavior is normal.    ED Course  Procedures (including critical care time) Scheduled Meds:   . acetaminophen  650 mg Oral Once   Continuous Infusions:  PRN Meds:.   Labs Reviewed  LACTIC ACID, PLASMA   Dg Chest 2 View  09/27/2011  *RADIOLOGY REPORT*  Clinical Data: Shortness of breath, history of ovarian cancer  CHEST - 2 VIEW  Comparison: 07/08/2011  Findings: Cardiomediastinal silhouette is stable.  Right Port-A- Cath is unchanged in position.  Stable bilateral vascular crowding and probable chronic mild interstitial prominence.  No acute infiltrate or pulmonary edema.  Bony thorax is stable.  IMPRESSION: No active disease.  No significant change.   Original Report Authenticated By: Natasha Mead, M.D.    Ct Angio Chest Pe W/cm &/or Wo Cm  09/27/2011  *RADIOLOGY REPORT*  Clinical Data: Dyspnea.  Decreased oxygen saturation  CT ANGIOGRAPHY CHEST  Technique:  Multidetector CT imaging of the chest using the standard protocol during bolus administration of intravenous contrast. Multiplanar reconstructed images including MIPs were obtained and reviewed to evaluate the vascular anatomy.  Contrast: OMNIPAQUE IOHEXOL 350 MG/ML SOLN  Comparison: 07/10/2011  Findings: No enlarged axillary or supraclavicular adenopathy. There is a right chest wall porta-catheter and the tip is in the lower SVC.  Asymmetric skin thickening is identified overlying the left breast.  The multiple calcified mediastinal and hilar lymph nodes are identified compatible with prior granulomatous disease.  No adenopathy noted within the mediastinum  or hilar region.  No pericardial or pleural effusion identified.  Bilateral multifocal patchy ground-glass densities are seen in both lungs.  Basilar predominant interstitial reticulation and subpleural honeycombing is identified.  Compared with the previous exam there are new multifocal bilateral areas of ground-glass attenuation.  Review of the visualized bony structures Is significant for multilevel thoracic spondylosis.  No abnormal filling defects are noted within the main pulmonary artery or its branches to suggest acute pulmonary embolus.  IMPRESSION:  1.  No evidence for acute pulmonary embolus. 2.  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 3.  Bilateral ground-glass opacities are scattered throughout both lungs.  Likely inflammatory or infectious in etiology.   Original Report Authenticated By: Rosealee Albee, M.D.      1. Community acquired pneumonia   2. Fever       MDM  Review of lab work from today reveals unremarkable CBC and metabolic profile patient patient encouraged to take, on and force fluids.       Nelia Shi, MD 09/27/11 2255

## 2011-09-27 NOTE — Progress Notes (Signed)
Hematology and Oncology Follow Up Visit  Loukisha Gunnerson [id number] 06/14/1955 56 y.o. 09/27/2011    HPI: 56 year old [place] woman with recurrent fallopian tube/ovarian carcinoma for which she completed 2 cycles of carboplatin/Taxol, cycle 3 held for one week ago due to complicated medical history including 2 hospitalizations following each cycle of chemotherapy, now receiving every other week Doxil.  2. Right jugular vein DVT, prior course of  Arixtra 7.5 mg subcutaneous daily, now on Coumadin 7.5 mg daily.  3. Previous history of breast carcinoma, left breast, April 2009.  4. BCRA positive  Interim History:   Here for lab check and Doxil treatment. Last treatment Doxil 09/13/11. Feels poorly today, no energy and dyspneic on exertion. States she has not felt well for several days, missing church last Sunday. Continues Coumadin 7.5mg  daily. Has been on 10 mg/day in the past. Denies bleeding or bruising symptoms. No headache or blurred vision. New onset dry cough. No abdominal pain or new bone pain. Bowel and bladder function are normal. Appetite is good, with adequate fluid intake.  Mild nausea, no vomiting. No lingering significant neuropathy, mild numbness and tingling of the fingertips on her right hand, chronic. Remainder of the 10 point  review of systems is negative.  Had appt with gyn oncology at the request of Dr. Donnie Coffin to discuss surgical options. Dr. Duard Brady recommends completion of 2 add'l cycles of Doxil followed by PET scan and close surveillance.   Medications:   I have reviewed the patient's current medications.  Allergies:  Allergies  Allergen Reactions  . Lisinopril Cough  . Codeine Itching  . Oxycodone Nausea Only  . Vicodin (Hydrocodone-Acetaminophen) Nausea Only    Physical Exam: Filed Vitals:   09/27/11 1126  BP: 138/89  Pulse: 120  Temp: 99.1 F (37.3 C)  Resp: 20    Body mass index is 40.26 kg/(m^2). Weight: 207 lbs. HEENT:  Sclerae  anicteric, conjunctivae pink.  Oropharynx clear.  No mucositis or candidiasis.  No evidence of  frank distention over right jugular vein region. O2 sat 87% with exertion.  Nodes:  No cervical, supraclavicular, or axillary lymphadenopathy palpated.  Lungs:  Diminished throughout, no crackles, rhonchi, or wheezes.   Heart:  Tachycardic rate 139, normal rhythm.   Abdomen:  Obese, soft, nontender.  Positive bowel sounds.  No organomegaly or masses palpated.  No fluid wave of ascites.  Musculoskeletal:  No focal spinal tenderness to palpation.  Extremities:   1+ pitting edema bilateral lower extremities. No cyanosis.  No upper extremity edema Skin:  Benign.   Neuro:  Nonfocal, alert and oriented x 3.  Lab Results: Lab Results  Component Value Date   WBC 5.2 09/27/2011   HGB 9.2* 09/27/2011   HCT 27.7* 09/27/2011   MCV 97.5 09/27/2011   PLT 207 09/27/2011   NEUTROABS 3.4 09/27/2011     Chemistry      Component Value Date/Time   NA 138 09/06/2011 0827   K 3.8 09/06/2011 0827   CL 100 09/06/2011 0827   CO2 29 09/06/2011 0827   BUN 14 09/06/2011 0827   CREATININE 0.76 09/06/2011 0827      Component Value Date/Time   CALCIUM 8.7 09/06/2011 0827   ALKPHOS 94 09/06/2011 0827   AST 18 09/06/2011 0827   ALT 26 09/06/2011 0827   BILITOT 0.4 09/06/2011 0827      Assessment:  1. Ms. Shad is a 56 year old [name] with recurrent fallopian tube/ovarian carcinoma for which she completed 2 cycles  of carboplatin/Taxol, cycle 3 held for one week ago due to complicated medical history including two hospitalizations following each cycle of chemotherapy.  Receives Doxil, given every two weeks. 2. History right jugular vein DVT, initially on Arixtra 7.5 mg subcutaneous daily, now on Coumadin 7.5 mg daily.   3. Marked response to therapy with normalization of CA125.  4. Subtherapeutic INR, 2.90. 5. Tachycardia and nausea. Received fluids last week.  6. O2 sat 87% with exertion, HR 139.    Plan:    1. Chest x-ray today. She will be transported to [facility] in via wheelchair.  2. 2 add'l cycles of chemo planned, will delay today's treatment until 8/28.  3. Continue Coumadin 7.5 mg and we will recheck INR at next visit 8/22.  4. PET scan at the completion of treatment.  5. Will plan for close surveillance after completion of treatment.   Plan is discussed with Dr. Donnie Coffin.      Gregroy Dombkowski, FNP-C  Addendum: Chest x-ray unremarkable. (Reviewed by Dr. Donnie Coffin). Will order CT angio. Pt is transported to [facility] via wheelchair.  Addendum: CT angio shows no evidence of PE. Ground glass appearance bilateral lungs may be infectious or inflammatory. Levaquin 500 mg daily x 7 days and Medrol Dosepack ordered.   Findings shared with Dr. Donnie Coffin. Plan are per his instructions.

## 2011-09-27 NOTE — Patient Instructions (Addendum)
Chest x-ray today. She will be transported to [facility] in via wheelchair.  2. 2 add'l cycles of chemo planned, will delay today's treatment until 8/28.  3. Continue Coumadin 7.5 mg and we will recheck INR at next visit 8/22.  4. PET scan at the completion of treatment.  5. Will plan for close surveillance after completion of treatmen

## 2011-09-27 NOTE — Telephone Encounter (Signed)
gve the pt her aug 2013 appt calendar 

## 2011-09-27 NOTE — ED Notes (Signed)
Pt states she took a nap this evening and when she woke she felt hot so she took her temp and it was 101.3.  Pt states she was seen by her PCP earlier today and was told she may have pneumonia and was started on some antibiotics levofloxacin 500mg  once a day and methyprednisone 4mg  take as directed  Pt states she has had one dose of each today

## 2011-10-03 ENCOUNTER — Telehealth: Payer: Self-pay | Admitting: *Deleted

## 2011-10-03 ENCOUNTER — Ambulatory Visit (HOSPITAL_BASED_OUTPATIENT_CLINIC_OR_DEPARTMENT_OTHER): Payer: Medicare Other

## 2011-10-03 ENCOUNTER — Ambulatory Visit (HOSPITAL_BASED_OUTPATIENT_CLINIC_OR_DEPARTMENT_OTHER): Payer: Medicare Other | Admitting: Family

## 2011-10-03 ENCOUNTER — Other Ambulatory Visit (HOSPITAL_BASED_OUTPATIENT_CLINIC_OR_DEPARTMENT_OTHER): Payer: Medicare Other | Admitting: Lab

## 2011-10-03 ENCOUNTER — Encounter: Payer: Self-pay | Admitting: Family

## 2011-10-03 VITALS — BP 133/90 | HR 118 | Temp 98.5°F | Resp 20 | Ht 62.05 in | Wt 218.0 lb

## 2011-10-03 DIAGNOSIS — Z1501 Genetic susceptibility to malignant neoplasm of breast: Secondary | ICD-10-CM

## 2011-10-03 DIAGNOSIS — C5700 Malignant neoplasm of unspecified fallopian tube: Secondary | ICD-10-CM

## 2011-10-03 DIAGNOSIS — Z853 Personal history of malignant neoplasm of breast: Secondary | ICD-10-CM

## 2011-10-03 DIAGNOSIS — Z86718 Personal history of other venous thrombosis and embolism: Secondary | ICD-10-CM

## 2011-10-03 DIAGNOSIS — Z5181 Encounter for therapeutic drug level monitoring: Secondary | ICD-10-CM

## 2011-10-03 DIAGNOSIS — Z7901 Long term (current) use of anticoagulants: Secondary | ICD-10-CM

## 2011-10-03 DIAGNOSIS — Z5111 Encounter for antineoplastic chemotherapy: Secondary | ICD-10-CM

## 2011-10-03 DIAGNOSIS — C569 Malignant neoplasm of unspecified ovary: Secondary | ICD-10-CM

## 2011-10-03 DIAGNOSIS — O2230 Deep phlebothrombosis in pregnancy, unspecified trimester: Secondary | ICD-10-CM

## 2011-10-03 LAB — CBC WITH DIFFERENTIAL/PLATELET
BASO%: 0.2 % (ref 0.0–2.0)
EOS%: 1.3 % (ref 0.0–7.0)
LYMPH%: 17.6 % (ref 14.0–49.7)
MCHC: 32.7 g/dL (ref 31.5–36.0)
MCV: 98.1 fL (ref 79.5–101.0)
MONO%: 17.6 % — ABNORMAL HIGH (ref 0.0–14.0)
Platelets: 296 10*3/uL (ref 145–400)
RBC: 3.09 10*6/uL — ABNORMAL LOW (ref 3.70–5.45)
WBC: 6.3 10*3/uL (ref 3.9–10.3)
nRBC: 0 % (ref 0–0)

## 2011-10-03 LAB — PROTIME-INR: Protime: 21.6 Seconds — ABNORMAL HIGH (ref 10.6–13.4)

## 2011-10-03 MED ORDER — DEXTROSE 5 % IV SOLN
30.0000 mg/m2 | Freq: Once | INTRAVENOUS | Status: AC
  Administered 2011-10-03: 60 mg via INTRAVENOUS
  Filled 2011-10-03: qty 30

## 2011-10-03 MED ORDER — SODIUM CHLORIDE 0.9 % IV SOLN
Freq: Once | INTRAVENOUS | Status: AC
  Administered 2011-10-03: 12:00:00 via INTRAVENOUS

## 2011-10-03 MED ORDER — ONDANSETRON 8 MG/50ML IVPB (CHCC)
8.0000 mg | Freq: Once | INTRAVENOUS | Status: AC
  Administered 2011-10-03: 8 mg via INTRAVENOUS

## 2011-10-03 MED ORDER — DEXAMETHASONE SODIUM PHOSPHATE 10 MG/ML IJ SOLN
10.0000 mg | Freq: Once | INTRAMUSCULAR | Status: AC
  Administered 2011-10-03: 10 mg via INTRAVENOUS

## 2011-10-03 MED ORDER — HEPARIN SOD (PORK) LOCK FLUSH 100 UNIT/ML IV SOLN
500.0000 [IU] | Freq: Once | INTRAVENOUS | Status: AC | PRN
  Administered 2011-10-03: 500 [IU]
  Filled 2011-10-03: qty 5

## 2011-10-03 MED ORDER — SODIUM CHLORIDE 0.9 % IJ SOLN
10.0000 mL | INTRAMUSCULAR | Status: DC | PRN
  Administered 2011-10-03: 10 mL
  Filled 2011-10-03: qty 10

## 2011-10-03 NOTE — Telephone Encounter (Signed)
Gave patient appointment for 10-11-2011 dr.rudolph off on 10-10-2011 gave patient appointment for 10-17-2011 gave patient appointment for 10-25-2011 emailed michelle to set up treatment on 10-17-2011

## 2011-10-03 NOTE — Telephone Encounter (Signed)
Per staff message and POF I have scheduled appts.  JMW  

## 2011-10-03 NOTE — Patient Instructions (Addendum)
Take Coumadin 7.5 mg (1 1/2 of the 5 mg pills) every day except Tuesday and Thursday, take 10 mg (2 of the 5 mg pills)

## 2011-10-03 NOTE — Patient Instructions (Signed)
[facility] Discharge Instructions for Patients Receiving Chemotherapy  Today you received the following chemotherapy agents Doxil To help prevent nausea and vomiting after your treatment, we encourage you to take your nausea medication as prescribed.If you develop nausea and vomiting that is not controlled by your nausea medication, call the clinic. If it is after clinic hours your family physician or the after hours number for the clinic or go to the Emergency Department.   BELOW ARE SYMPTOMS THAT SHOULD BE REPORTED IMMEDIATELY:  *FEVER GREATER THAN 100.5 F  *CHILLS WITH OR WITHOUT FEVER  NAUSEA AND VOMITING THAT IS NOT CONTROLLED WITH YOUR NAUSEA MEDICATION  *UNUSUAL SHORTNESS OF BREATH  *UNUSUAL BRUISING OR BLEEDING  TENDERNESS IN MOUTH AND THROAT WITH OR WITHOUT PRESENCE OF ULCERS  *URINARY PROBLEMS  *BOWEL PROBLEMS  UNUSUAL RASH Items with * indicate a potential emergency and should be followed up as soon as possible.  One of the nurses will contact you 24 hours after your treatment. Please let the nurse know about any problems that you may have experienced. Feel free to call the clinic you have any questions or concerns. The clinic phone number is [phone].   I have been informed and understand all the instructions given to me. I know to contact the clinic, my physician, or go to the Emergency Department if any problems should occur. I do not have any questions at this time, but understand that I may call the clinic during office hours   should I have any questions or need assistance in obtaining follow up care.    __________________________________________  _____________  __________ Signature of Patient or Authorized Representative            Date                   Time    __________________________________________ Nurse's Signature    

## 2011-10-03 NOTE — Progress Notes (Signed)
Hematology and Oncology Follow Up Visit  Abigail [name] [id number] 12-22-1955 56 y.o. 10/03/2011    HPI: 56 year old [place] woman with recurrent fallopian tube/ovarian carcinoma for which she completed 2 cycles of carboplatin/Taxol, cycle 3 held for one week ago due to complicated medical history including 2 hospitalizations following each cycle of chemotherapy, now receiving every other week Doxil.  2. Right jugular vein DVT, prior course of  Arixtra 7.5 mg subcutaneous daily, now on Coumadin 7.5 mg daily.  3. Previous history of breast carcinoma, left breast, April 2009.  4. BRCA positive  Interim History:   Here for lab check and Doxil treatment. Last treatment Doxil 09/13/11, treatment was held last week after chest x-ray suggested pneumonia. I prescribed Levaquin on last visit and she tells me she completed the prescription. Feels much improved over last week, less dyspnea. Continues Coumadin 7.5mg  daily. Has been on 10 mg/day in the past. Denies bleeding or bruising symptoms. No headache or blurred vision. Dry cough is improved. No abdominal pain or new bone pain. Bowel and bladder function are normal. Appetite is good, with adequate fluid intake.  Mild nausea, no vomiting. No lingering significant neuropathy, mild numbness and tingling of the fingertips on her right hand, chronic. Remainder of the 10 point  review of systems is negative.  Had appt with gyn oncology at the request of Dr. Donnie Coffin to discuss surgical options. Dr. Duard Brady recommended completion of 2 add'l cycles of Doxil followed by PET scan and close surveillance.   Medications:   I have reviewed the patient's current medications.  Allergies:  Allergies  Allergen Reactions  . Lisinopril Cough  . Codeine Itching  . Oxycodone Nausea Only  . Vicodin (Hydrocodone-Acetaminophen) Nausea Only    Physical Exam: Filed Vitals:   10/03/11 1002  BP: 133/90  Pulse: 118  Temp: 98.5 F (36.9 C)  Resp: 20   Body mass index is 39.81 kg/(m^2). Weight: 207 lbs. HEENT:  Sclerae anicteric, conjunctivae pink.  Oropharynx clear.  No mucositis or candidiasis.  No evidence of  frank distention over right jugular vein region. O2 sat 87% with exertion.  Nodes:  No cervical, supraclavicular, or axillary lymphadenopathy palpated.  Lungs:  Diminished throughout, no crackles, rhonchi, or wheezes.   Heart:  Tachycardic, normal rhythm.  Bilateral lower extremity edema.  Abdomen:  Obese, soft, nontender.  Positive bowel sounds.  No organomegaly or masses palpated.  No fluid wave of ascites.  Musculoskeletal:  No focal spinal tenderness to palpation.  Extremities:   1+ pitting edema bilateral lower extremities. No cyanosis.  No upper extremity edema Skin:  Benign.   Neuro:  Nonfocal, alert and oriented x 3.  Lab Results: Lab Results  Component Value Date   WBC 6.3 10/03/2011   HGB 9.9* 10/03/2011   HCT 30.3* 10/03/2011   MCV 98.1 10/03/2011   PLT 296 10/03/2011   NEUTROABS 4.0 10/03/2011     Chemistry      Component Value Date/Time   NA 138 09/06/2011 0827   K 3.8 09/06/2011 0827   CL 100 09/06/2011 0827   CO2 29 09/06/2011 0827   BUN 14 09/06/2011 0827   CREATININE 0.76 09/06/2011 0827      Component Value Date/Time   CALCIUM 8.7 09/06/2011 0827   ALKPHOS 94 09/06/2011 0827   AST 18 09/06/2011 0827   ALT 26 09/06/2011 0827   BILITOT 0.4 09/06/2011 0827      Assessment:  1. Abigail [name] is a 56 year old [name] with  recurrent fallopian tube/ovarian carcinoma for which she completed 2 cycles of carboplatin/Taxol, cycle 3 held for one week due to complicated medical history including two hospitalizations following each cycle of chemotherapy.  Receives Doxil, given every two weeks. Treatment held last week for dyspnea.  2. History right jugular vein DVT, initially on Arixtra 7.5 mg subcutaneous daily, now on Coumadin 7.5 mg daily.   3. Marked response to therapy with normalization of CA-125.  4.  Subtherapeutic INR, 1.80. 5. Respiratory status improved after 7 days of Levaquin.    Plan:  1. 2 add'l cycles of chemo planned, will treat today.  2. Will return 9/4 for lab and appt with me. Last treatment will be 9/11.  3. Continue Coumadin 7.5 mg every day except Tuesday and Thursday, 10 mg.   4. PET scan at the completion of treatment.  5. Will plan for close surveillance after completion of treatment.      Gloris Shiroma, FNP-C

## 2011-10-04 ENCOUNTER — Ambulatory Visit: Payer: Medicare Other | Admitting: Family

## 2011-10-04 ENCOUNTER — Other Ambulatory Visit: Payer: Medicare Other | Admitting: Lab

## 2011-10-08 ENCOUNTER — Other Ambulatory Visit: Payer: Self-pay | Admitting: Physician Assistant

## 2011-10-08 DIAGNOSIS — C5700 Malignant neoplasm of unspecified fallopian tube: Secondary | ICD-10-CM

## 2011-10-08 DIAGNOSIS — Z1501 Genetic susceptibility to malignant neoplasm of breast: Secondary | ICD-10-CM

## 2011-10-09 ENCOUNTER — Ambulatory Visit (INDEPENDENT_AMBULATORY_CARE_PROVIDER_SITE_OTHER): Payer: Medicare Other | Admitting: Cardiology

## 2011-10-09 ENCOUNTER — Encounter: Payer: Self-pay | Admitting: Cardiology

## 2011-10-09 VITALS — BP 171/96 | HR 131 | Ht 61.0 in | Wt 221.0 lb

## 2011-10-09 DIAGNOSIS — R0600 Dyspnea, unspecified: Secondary | ICD-10-CM

## 2011-10-09 DIAGNOSIS — R0989 Other specified symptoms and signs involving the circulatory and respiratory systems: Secondary | ICD-10-CM

## 2011-10-09 MED ORDER — METOPROLOL SUCCINATE ER 50 MG PO TB24: 50.0000 mg | ORAL_TABLET | Freq: Every day | ORAL | Status: DC

## 2011-10-09 NOTE — Patient Instructions (Addendum)
Please start Toprol XL 50 mg a day (metoprolol tartrate) Continue all other medications as listed  Follow up in 3 months with Dr Antoine Poche

## 2011-10-09 NOTE — Progress Notes (Signed)
HPI The patient presents as a new patient for evaluation of dyspnea and tachycardia. She has had an extensive workup of this with her pulmonologist. In short her echo was unremarkable. I sent her for stress perfusion study which demonstrated no ischemia or infarct.  She has since had pulmonary function testing. There were no overt abnormalities on this. I did have her wear a Holter which demonstrated sinus tachycardia with an average rate of 107 but with normal gait and 90 variation. It was sinus tachycardia with rare PVCs and PACs. She does think her breathing is perhaps slightly better than it was. She's not describing any PND or orthopnea. She's not had any palpitations, presyncope or syncope. She's not having any chest discomfort. She did get chemotherapy recently and this makes her fatigue and weak. She has had no nausea vomiting or diaphoresis.  Allergies  Allergen Reactions  . Lisinopril Cough  . Codeine Itching  . Oxycodone Nausea Only  . Vicodin (Hydrocodone-Acetaminophen) Nausea Only    Current Outpatient Prescriptions  Medication Sig Dispense Refill  . amLODipine (NORVASC) 5 MG tablet Take 1 tablet (5 mg total) by mouth daily.  30 tablet  11  . Cholecalciferol (VITAMIN D3) 3000 UNITS TABS Take 3,000 Units by mouth daily.       Marland Kitchen dexamethasone (DECADRON) 4 MG tablet Take 2 tablets by mouth daily starting the day after chemotherapy for 2 days. Take with food.  30 tablet  1  . KLOR-CON M20 20 MEQ tablet TAKE 1 TABLET BY MOUTH EVERY DAY  30 tablet  0  . lidocaine-prilocaine (EMLA) cream Apply topically as needed. For PAC  30 g  1  . lisinopril-hydrochlorothiazide (PRINZIDE,ZESTORETIC) 10-12.5 MG per tablet Take 1 tablet by mouth daily.      Marland Kitchen LORazepam (ATIVAN) 0.5 MG tablet Take 1 tablet (0.5 mg total) by mouth every 6 (six) hours as needed (Nausea or vomiting).  30 tablet  0  . ondansetron (ZOFRAN) 8 MG tablet Take 1 tablet two times a day starting the day after chemo for 2 days.  Then take 1 tablet two times a day as needed for nausea or vomiting.  30 tablet  1  . prochlorperazine (COMPAZINE) 10 MG tablet Take 1 tablet (10 mg total) by mouth every 6 (six) hours as needed (Nausea or vomiting).  30 tablet  1  . prochlorperazine (COMPAZINE) 25 MG suppository Place 25 mg rectally every 12 (twelve) hours as needed.      . warfarin (COUMADIN) 5 MG tablet Take 7.5 mg by mouth daily. 1.5 tabs everyday       No current facility-administered medications for this visit.   Facility-Administered Medications Ordered in Other Visits  Medication Dose Route Frequency Provider Last Rate Last Dose  . fondaparinux (ARIXTRA) injection 7.5 mg  7.5 mg Subcutaneous Q24H Pierce Crane, MD   7.5 mg at 07/17/11 1327    Past Medical History  Diagnosis Date  . Hypertension   . Obesity   . Diverticulitis   . Hemorrhoids   . Colonic polyp   . GERD (gastroesophageal reflux disease)   . Cancer     BREAST, FALLOPIAN TUBE,  . Blood clot of neck vein     Past Surgical History  Procedure Date  . Tonsillectomy and adenoidectomy   . Breast lumpectomy   . Abdominal hysterectomy     TAH, BSO  . Port a cath     05/29/11    ROS:  As stated in the HPI and  negative for all other systems.  PHYSICAL EXAM BP 171/96  Pulse 131  Ht 5\' 1"  (1.549 m)  Wt 221 lb (100.245 kg)  BMI 41.76 kg/m2 GENERAL:  Well appearing HEENT:  Pupils equal round and reactive, fundi not visualized, oral mucosa unremarkable, poor dentition NECK:  No jugular venous distention, waveform within normal limits, carotid upstroke brisk and symmetric, no bruits, no thyromegaly LYMPHATICS:  No cervical, inguinal adenopathy LUNGS:  Clear to auscultation bilaterally BACK:  No CVA tenderness CHEST:  Unremarkable HEART:  PMI not displaced or sustained,S1 and S2 within normal limits, no S3, no S4, no clicks, no rubs, no murmurs ABD:  Flat, positive bowel sounds normal in frequency in pitch, no bruits, no rebound, no guarding, no  midline pulsatile mass, no hepatomegaly, no splenomegaly EXT:  2 plus pulses throughout, no edema, no cyanosis no clubbing SKIN:  No rashes no nodules   ASSESSMENT AND PLAN  Dyspnea -  The etiology of  this is likely multifactorial.  No further work up is planned.    Sinus tachycardia - Her work up has been unrevealing as to an etiology.  I will go ahead and treat with beta blocker.    HTN - This is being treated in the context treating her tachycardia.

## 2011-10-11 ENCOUNTER — Ambulatory Visit (HOSPITAL_BASED_OUTPATIENT_CLINIC_OR_DEPARTMENT_OTHER): Payer: Medicare Other | Admitting: Family

## 2011-10-11 ENCOUNTER — Other Ambulatory Visit (HOSPITAL_BASED_OUTPATIENT_CLINIC_OR_DEPARTMENT_OTHER): Payer: Medicare Other | Admitting: Lab

## 2011-10-11 ENCOUNTER — Encounter: Payer: Self-pay | Admitting: Family

## 2011-10-11 ENCOUNTER — Telehealth: Payer: Self-pay | Admitting: Oncology

## 2011-10-11 VITALS — BP 155/86 | HR 132 | Temp 99.6°F | Resp 20 | Ht 61.0 in | Wt 220.2 lb

## 2011-10-11 DIAGNOSIS — I82C19 Acute embolism and thrombosis of unspecified internal jugular vein: Secondary | ICD-10-CM

## 2011-10-11 DIAGNOSIS — Z853 Personal history of malignant neoplasm of breast: Secondary | ICD-10-CM

## 2011-10-11 DIAGNOSIS — C5700 Malignant neoplasm of unspecified fallopian tube: Secondary | ICD-10-CM

## 2011-10-11 DIAGNOSIS — Z7901 Long term (current) use of anticoagulants: Secondary | ICD-10-CM

## 2011-10-11 DIAGNOSIS — Z86718 Personal history of other venous thrombosis and embolism: Secondary | ICD-10-CM

## 2011-10-11 DIAGNOSIS — Z1501 Genetic susceptibility to malignant neoplasm of breast: Secondary | ICD-10-CM

## 2011-10-11 LAB — CBC WITH DIFFERENTIAL/PLATELET
EOS%: 0.4 % (ref 0.0–7.0)
MCH: 32.4 pg (ref 25.1–34.0)
MCV: 97.5 fL (ref 79.5–101.0)
MONO%: 11.5 % (ref 0.0–14.0)
RBC: 3.24 10*6/uL — ABNORMAL LOW (ref 3.70–5.45)
RDW: 16.2 % — ABNORMAL HIGH (ref 11.2–14.5)
nRBC: 0 % (ref 0–0)

## 2011-10-11 LAB — PROTIME-INR: Protime: 32.4 Seconds — ABNORMAL HIGH (ref 10.6–13.4)

## 2011-10-11 MED ORDER — BENZONATATE 200 MG PO CAPS: 200.0000 mg | ORAL_CAPSULE | Freq: Three times a day (TID) | ORAL | Status: AC | PRN

## 2011-10-11 NOTE — Patient Instructions (Signed)
Return 9/11 for appt with me and next treatment.

## 2011-10-11 NOTE — Telephone Encounter (Signed)
gve the pt her sept 2013 appt calendar °

## 2011-10-11 NOTE — Progress Notes (Signed)
Hematology and Oncology Follow Up Visit  Viola Kinnick [id number] 1955-05-26 56 y.o. 10/11/2011    HPI: 56 year old [place] woman with recurrent fallopian tube/ovarian carcinoma for which she completed 2 cycles of carboplatin/Taxol, cycle 3 held for one week ago due to complicated medical history including 2 hospitalizations following each cycle of chemotherapy, now receiving every other week Doxil.  2. Right jugular vein DVT, prior course of  Arixtra 7.5 mg subcutaneous daily, now on Coumadin 7.5 mg daily.  3. Previous history of breast carcinoma, left breast, April 2009.  4. BRCA positive  Interim History:   Here for lab check and visit only. Last Doxil treatment will be 10/17/11. Continues Coumadin 7.5mg  daily except Tues and Thursday, takes 10 mg. Denies bleeding or bruising symptoms. No headache or blurred vision. Dry cough is persistent, asks about cough medicine. No abdominal pain or new bone pain. Bowel and bladder function are normal. Appetite is good, with adequate fluid intake.  Mild nausea, no vomiting. No lingering significant neuropathy, mild numbness and tingling of the fingertips on her right hand, chronic. Had appt with Dr. Antoine Poche who prescribed beta blocker for tachycardia. He had no explanation for dyspnea. He will continue to monitor. Remainder of the 10 point  review of systems is negative.  Medications:   I have reviewed the patient's current medications.  Allergies:  Allergies  Allergen Reactions  . Lisinopril Cough  . Codeine Itching  . Oxycodone Nausea Only  . Vicodin (Hydrocodone-Acetaminophen) Nausea Only    Physical Exam: Filed Vitals:   10/11/11 0919  BP: 155/86  Pulse: 132  Temp: 99.6 F (37.6 C)  Resp: 20    Body mass index is 41.61 kg/(m^2). Weight: 207 lbs. HEENT:  Sclerae anicteric, conjunctivae pink.  Oropharynx clear.  No mucositis or candidiasis.  No evidence of  frank distention over right jugular vein region. O2 sat 87%  with exertion.  Nodes:  No cervical, supraclavicular, or axillary lymphadenopathy palpated.  Lungs:  Diminished throughout, no crackles, rhonchi, or wheezes.   Heart:  Tachycardic, normal rhythm.  Bilateral lower extremity edema.  Abdomen:  Obese, soft, nontender.  Positive bowel sounds.  No organomegaly or masses palpated.  No fluid wave of ascites.  Musculoskeletal:  No focal spinal tenderness to palpation.  Extremities:   1+ pitting edema bilateral lower extremities. No cyanosis.  No upper extremity edema Skin:  Benign.   Neuro:  Nonfocal, alert and oriented x 3.  Lab Results: Lab Results  Component Value Date   WBC 5.7 10/11/2011   HGB 10.5* 10/11/2011   HCT 31.6* 10/11/2011   MCV 97.5 10/11/2011   PLT 205 10/11/2011   NEUTROABS 4.1 10/11/2011     Chemistry      Component Value Date/Time   NA 138 09/06/2011 0827   K 3.8 09/06/2011 0827   CL 100 09/06/2011 0827   CO2 29 09/06/2011 0827   BUN 14 09/06/2011 0827   CREATININE 0.76 09/06/2011 0827      Component Value Date/Time   CALCIUM 8.7 09/06/2011 0827   ALKPHOS 94 09/06/2011 0827   AST 18 09/06/2011 0827   ALT 26 09/06/2011 0827   BILITOT 0.4 09/06/2011 0827      Assessment:  1. Ms. Eckstrom is a 56 year old [name] with recurrent fallopian tube/ovarian carcinoma for which she completed 2 cycles of carboplatin/Taxol, cycle 3 held for one week due to complicated medical history including two hospitalizations following each cycle of chemotherapy.  Receives Doxil, given every two  weeks. Treatment held last week for dyspnea.  2. History right jugular vein DVT, initially on Arixtra 7.5 mg subcutaneous daily, now on Coumadin..   3. Marked response to therapy with normalization of CA-125.  4. Therapeutic INR,  2.70. 5. Respiratory status improved after 7 days of Levaquin, now on beta block per Dr. Antoine Poche for tachycardia.  6. Persistent dry cough.   Plan:  1. Will return 9/11 for lab and appt with me, and last treatment.  3.  Continue Coumadin 7.5 mg every day except Tuesday and Thursday, 10 mg.   4. PET scan at the completion of treatment, I will schedule on the next visit.  5. Will plan for close surveillance after completion of treatment.  6. Prescription for Jerilynn Som is e-scribed.      Kazmir Oki, FNP-C

## 2011-10-17 ENCOUNTER — Encounter: Payer: Self-pay | Admitting: Family

## 2011-10-17 ENCOUNTER — Ambulatory Visit (HOSPITAL_BASED_OUTPATIENT_CLINIC_OR_DEPARTMENT_OTHER): Payer: Medicare Other

## 2011-10-17 ENCOUNTER — Other Ambulatory Visit (HOSPITAL_BASED_OUTPATIENT_CLINIC_OR_DEPARTMENT_OTHER): Payer: Medicare Other | Admitting: Lab

## 2011-10-17 ENCOUNTER — Telehealth: Payer: Self-pay | Admitting: *Deleted

## 2011-10-17 ENCOUNTER — Ambulatory Visit (HOSPITAL_BASED_OUTPATIENT_CLINIC_OR_DEPARTMENT_OTHER): Payer: Medicare Other | Admitting: Family

## 2011-10-17 VITALS — BP 125/78 | HR 118 | Temp 99.3°F | Resp 20 | Ht 61.0 in | Wt 218.8 lb

## 2011-10-17 DIAGNOSIS — C5700 Malignant neoplasm of unspecified fallopian tube: Secondary | ICD-10-CM

## 2011-10-17 DIAGNOSIS — Z86718 Personal history of other venous thrombosis and embolism: Secondary | ICD-10-CM

## 2011-10-17 DIAGNOSIS — Z7901 Long term (current) use of anticoagulants: Secondary | ICD-10-CM

## 2011-10-17 DIAGNOSIS — Z1501 Genetic susceptibility to malignant neoplasm of breast: Secondary | ICD-10-CM

## 2011-10-17 DIAGNOSIS — Z853 Personal history of malignant neoplasm of breast: Secondary | ICD-10-CM

## 2011-10-17 DIAGNOSIS — Z5111 Encounter for antineoplastic chemotherapy: Secondary | ICD-10-CM

## 2011-10-17 DIAGNOSIS — J069 Acute upper respiratory infection, unspecified: Secondary | ICD-10-CM

## 2011-10-17 LAB — CBC WITH DIFFERENTIAL/PLATELET
Eosinophils Absolute: 0.1 10*3/uL (ref 0.0–0.5)
HCT: 28.9 % — ABNORMAL LOW (ref 34.8–46.6)
LYMPH%: 18.7 % (ref 14.0–49.7)
MCV: 95.7 fL (ref 79.5–101.0)
MONO%: 12.1 % (ref 0.0–14.0)
NEUT#: 3.3 10*3/uL (ref 1.5–6.5)
NEUT%: 66.4 % (ref 38.4–76.8)
Platelets: 186 10*3/uL (ref 145–400)
RBC: 3.02 10*6/uL — ABNORMAL LOW (ref 3.70–5.45)
nRBC: 0 % (ref 0–0)

## 2011-10-17 MED ORDER — HEPARIN SOD (PORK) LOCK FLUSH 100 UNIT/ML IV SOLN
500.0000 [IU] | Freq: Once | INTRAVENOUS | Status: AC | PRN
  Administered 2011-10-17: 500 [IU]
  Filled 2011-10-17: qty 5

## 2011-10-17 MED ORDER — SODIUM CHLORIDE 0.9 % IV SOLN
Freq: Once | INTRAVENOUS | Status: AC
  Administered 2011-10-17: 11:00:00 via INTRAVENOUS

## 2011-10-17 MED ORDER — PROMETHAZINE-DM 6.25-15 MG/5ML PO SYRP: 5.0000 mL | ORAL_SOLUTION | Freq: Four times a day (QID) | ORAL | Status: AC | PRN

## 2011-10-17 MED ORDER — AZITHROMYCIN 250 MG PO TABS: ORAL_TABLET | ORAL | Status: DC

## 2011-10-17 MED ORDER — SODIUM CHLORIDE 0.9 % IJ SOLN
10.0000 mL | INTRAMUSCULAR | Status: DC | PRN
  Administered 2011-10-17: 10 mL
  Filled 2011-10-17: qty 10

## 2011-10-17 MED ORDER — DOXORUBICIN HCL LIPOSOMAL CHEMO INJECTION 2 MG/ML
30.0000 mg/m2 | Freq: Once | INTRAVENOUS | Status: AC
  Administered 2011-10-17: 60 mg via INTRAVENOUS
  Filled 2011-10-17: qty 30

## 2011-10-17 MED ORDER — DEXAMETHASONE SODIUM PHOSPHATE 10 MG/ML IJ SOLN
10.0000 mg | Freq: Once | INTRAMUSCULAR | Status: AC
  Administered 2011-10-17: 10 mg via INTRAVENOUS

## 2011-10-17 MED ORDER — AZITHROMYCIN 250 MG PO TABS: 500.0000 mg | ORAL_TABLET | Freq: Every day | ORAL | Status: DC

## 2011-10-17 MED ORDER — AZITHROMYCIN 250 MG PO TABS: 250.0000 mg | ORAL_TABLET | Freq: Every day | ORAL | Status: DC

## 2011-10-17 MED ORDER — ONDANSETRON 8 MG/50ML IVPB (CHCC)
8.0000 mg | Freq: Once | INTRAVENOUS | Status: AC
  Administered 2011-10-17: 8 mg via INTRAVENOUS

## 2011-10-17 NOTE — Progress Notes (Signed)
Hematology and Oncology Follow Up Visit  Abigail [name] [id number] 1955/06/13 56 y.o. 10/17/2011    HPI: 56 year old [place] woman with recurrent fallopian tube/ovarian carcinoma for which she completed 2 cycles of carboplatin/Taxol, cycle 3 held for one week ago due to complicated medical history including 2 hospitalizations following each cycle of chemotherapy, now receiving every other week Doxil.  2. Right jugular vein DVT, prior course of  Arixtra 7.5 mg subcutaneous daily, now on Coumadin 7.5 mg daily.  3. Previous history of breast carcinoma, left breast, April 2009.  4. BRCA positive  Interim History:   Here for lab check and visit only. Last Doxil treatment will be 10/17/11. Continues Coumadin 7.5mg  daily except Tues and Thursday, takes 10 mg. Denies bleeding or bruising symptoms. No headache or blurred vision. Dry cough is persistent, asks about cough medicine. No abdominal pain or new bone pain. Bowel and bladder function are normal. Appetite is good, with adequate fluid intake.  Mild nausea, no vomiting. No lingering significant neuropathy, mild numbness and tingling of the fingertips on her right hand, chronic. Had appt with Dr. Antoine Poche who prescribed beta blocker for tachycardia. He had no explanation for dyspnea. He will continue to monitor. Remainder of the 10 point  review of systems is negative.  Medications:   I have reviewed the patient's current medications.  Allergies:  Allergies  Allergen Reactions  . Lisinopril Cough  . Codeine Itching  . Oxycodone Nausea Only  . Vicodin (Hydrocodone-Acetaminophen) Nausea Only    Physical Exam: There were no vitals filed for this visit.  There is no height or weight on file to calculate BMI. Weight: 207 lbs. HEENT:  Sclerae anicteric, conjunctivae pink.  Oropharynx clear.  No mucositis or candidiasis.  No evidence of  frank distention over right jugular vein region. O2 sat 87% with exertion.  Nodes:  No  cervical, supraclavicular, or axillary lymphadenopathy palpated.  Lungs:  Diminished throughout, no crackles, rhonchi, or wheezes.   Heart:  Tachycardic, normal rhythm.  Bilateral lower extremity edema.  Abdomen:  Obese, soft, nontender.  Positive bowel sounds.  No organomegaly or masses palpated.  No fluid wave of ascites.  Musculoskeletal:  No focal spinal tenderness to palpation.  Extremities:   1+ pitting edema bilateral lower extremities. No cyanosis.  No upper extremity edema Skin:  Benign.   Neuro:  Nonfocal, alert and oriented x 3.  Lab Results: Lab Results  Component Value Date   WBC 5.7 10/11/2011   HGB 10.5* 10/11/2011   HCT 31.6* 10/11/2011   MCV 97.5 10/11/2011   PLT 205 10/11/2011   NEUTROABS 4.1 10/11/2011     Chemistry      Component Value Date/Time   NA 138 09/06/2011 0827   K 3.8 09/06/2011 0827   CL 100 09/06/2011 0827   CO2 29 09/06/2011 0827   BUN 14 09/06/2011 0827   CREATININE 0.76 09/06/2011 0827      Component Value Date/Time   CALCIUM 8.7 09/06/2011 0827   ALKPHOS 94 09/06/2011 0827   AST 18 09/06/2011 0827   ALT 26 09/06/2011 0827   BILITOT 0.4 09/06/2011 0827      Assessment:  1. Abigail [name] is a 56 year old [name] with recurrent fallopian tube/ovarian carcinoma for which she completed 2 cycles of carboplatin/Taxol, cycle 3 held for one week due to complicated medical history including two hospitalizations following each cycle of chemotherapy.  Receives Doxil, given every two weeks. Due for last treatment today.  2. History right  jugular vein DVT, initially on Arixtra 7.5 mg subcutaneous daily, now on Coumadin, INR therapeutic.   Takes 7.5 mg everyday except Tues and Thurs, takes 10 mg.  3. Marked response to therapy with normalization of CA-125.  4. Respiratory status improved after 7 days of Levaquin, now on beta block per Dr. Antoine Poche for tachycardia. Mild dyspnea in the office today after coughing spell. O2 sat 91%.  5. Persistent dry cough.    Plan:  1. Last treatment today.  2. Return 9/18 for lab and appt with me.  3. Continue Coumadin 7.5 mg every day except Tuesday and Thursday, 10 mg.   4. PET scan in approximately 2 weeks.  5. Will plan for close surveillance after completion of treatment.  6. Prescription for Z-pack, e-scribed.  7. Prescription for Phenergan-DM, e-scribed.  8. O2 at 2L per [place] while receiving chemo today.       Starasia Sinko, FNP-C

## 2011-10-17 NOTE — Telephone Encounter (Signed)
Gave patient PET SCAN for 10-29-2011 arrival time 11:15am  Patient is aware

## 2011-10-17 NOTE — Patient Instructions (Addendum)
[facility] Discharge Instructions for Patients Receiving Chemotherapy  Today you received the following chemotherapy agents Doxil  To help prevent nausea and vomiting after your treatment, we encourage you to take your nausea medication.   If you develop nausea and vomiting that is not controlled by your nausea medication, call the clinic. If it is after clinic hours your family physician or the after hours number for the clinic or go to the Emergency Department.   BELOW ARE SYMPTOMS THAT SHOULD BE REPORTED IMMEDIATELY:  *FEVER GREATER THAN 100.5 F  *CHILLS WITH OR WITHOUT FEVER  NAUSEA AND VOMITING THAT IS NOT CONTROLLED WITH YOUR NAUSEA MEDICATION  *UNUSUAL SHORTNESS OF BREATH  *UNUSUAL BRUISING OR BLEEDING  TENDERNESS IN MOUTH AND THROAT WITH OR WITHOUT PRESENCE OF ULCERS  *URINARY PROBLEMS  *BOWEL PROBLEMS  UNUSUAL RASH Items with * indicate a potential emergency and should be followed up as soon as possible.  One of the nurses will contact you 24 hours after your treatment. Please let the nurse know about any problems that you may have experienced. Feel free to call the clinic you have any questions or concerns. The clinic phone number is [phone].   I have been informed and understand all the instructions given to me. I know to contact the clinic, my physician, or go to the Emergency Department if any problems should occur. I do not have any questions at this time, but understand that I may call the clinic during office hours   should I have any questions or need assistance in obtaining follow up care.    __________________________________________  _____________  __________ Signature of Patient or Authorized Representative            Date                   Time    __________________________________________ Nurse's Signature    

## 2011-10-18 ENCOUNTER — Ambulatory Visit: Payer: Medicare Other | Admitting: Cardiology

## 2011-10-23 ENCOUNTER — Encounter: Payer: Self-pay | Admitting: Family

## 2011-10-23 ENCOUNTER — Telehealth: Payer: Self-pay | Admitting: *Deleted

## 2011-10-23 ENCOUNTER — Other Ambulatory Visit (INDEPENDENT_AMBULATORY_CARE_PROVIDER_SITE_OTHER): Payer: Medicare Other | Admitting: Lab

## 2011-10-23 ENCOUNTER — Ambulatory Visit (HOSPITAL_BASED_OUTPATIENT_CLINIC_OR_DEPARTMENT_OTHER): Payer: Medicare Other | Admitting: Family

## 2011-10-23 VITALS — BP 146/96 | HR 112 | Temp 98.2°F | Resp 20 | Ht 61.0 in | Wt 211.1 lb

## 2011-10-23 DIAGNOSIS — C5700 Malignant neoplasm of unspecified fallopian tube: Secondary | ICD-10-CM

## 2011-10-23 DIAGNOSIS — C569 Malignant neoplasm of unspecified ovary: Secondary | ICD-10-CM

## 2011-10-23 DIAGNOSIS — Z1501 Genetic susceptibility to malignant neoplasm of breast: Secondary | ICD-10-CM

## 2011-10-23 DIAGNOSIS — D509 Iron deficiency anemia, unspecified: Secondary | ICD-10-CM

## 2011-10-23 DIAGNOSIS — I82C19 Acute embolism and thrombosis of unspecified internal jugular vein: Secondary | ICD-10-CM

## 2011-10-23 DIAGNOSIS — Z86718 Personal history of other venous thrombosis and embolism: Secondary | ICD-10-CM

## 2011-10-23 LAB — PROTIME-INR

## 2011-10-23 LAB — CBC WITH DIFFERENTIAL/PLATELET
Basophils Absolute: 0 10*3/uL (ref 0.0–0.1)
Eosinophils Absolute: 0.1 10*3/uL (ref 0.0–0.5)
HCT: 31.2 % — ABNORMAL LOW (ref 34.8–46.6)
HGB: 10.5 g/dL — ABNORMAL LOW (ref 11.6–15.9)
LYMPH%: 14.8 % (ref 14.0–49.7)
MCHC: 33.8 g/dL (ref 31.5–36.0)
MONO#: 0.5 10*3/uL (ref 0.1–0.9)
NEUT#: 3.5 10*3/uL (ref 1.5–6.5)
NEUT%: 72.9 % (ref 38.4–76.8)
Platelets: 303 10*3/uL (ref 145–400)
WBC: 4.8 10*3/uL (ref 3.9–10.3)

## 2011-10-23 LAB — PROTHROMBIN TIME
INR: 3.89 — ABNORMAL HIGH (ref <1.50)
Prothrombin Time: 35.8 seconds — ABNORMAL HIGH (ref 11.6–15.2)

## 2011-10-23 NOTE — Telephone Encounter (Signed)
Gave patient appointment for pet scan and 11-05-2011 with labs and rubin

## 2011-10-23 NOTE — Progress Notes (Signed)
Hematology and Oncology Follow Up Visit  Abigail [name] [id number] 06-Aug-1955 56 y.o. 10/23/2011    HPI: 56 year old [place] woman with recurrent fallopian tube/ovarian carcinoma for which she completed 2 cycles of carboplatin/Taxol, cycle 3 held for one week ago due to complicated medical history including 2 hospitalizations following each cycle of chemotherapy, now completed 6 cycles of every other week Doxil.  2. 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  3. Previous history of breast carcinoma, left breast, April 2009.  4. BRCA positive  Interim History:   Here for lab check and visit only. Last Doxil treatment was 10/17/11. Continues Coumadin 7.5mg  daily except Tues and Thursday, takes 10 mg.INR's have been therapeutic. Denies bleeding or bruising symptoms. No headache or blurred vision. Dry cough is persistent, although improving with Z-pack and cough medicine. She completed Z-pack this morning. Dyspnea is improved. No abdominal pain or new bone pain. Bowel and bladder function are normal. Appetite is good, with adequate fluid intake.  Mild nausea, no vomiting. No lingering significant neuropathy, mild numbness and tingling of the fingertips on her right hand, chronic. Had appt with Abigail [name] who prescribed beta blocker for tachycardia. He had no explanation for chronic dyspnea. He will continue to monitor. Remainder of the 10 point  review of systems is negative.  Medications:   I have reviewed the patient's current medications.  Allergies:  Allergies  Allergen Reactions  . Lisinopril Cough  . Codeine Itching  . Oxycodone Nausea Only  . Vicodin (Hydrocodone-Acetaminophen) Nausea Only    Physical Exam: Filed Vitals:   10/23/11 1111  BP: 146/96  Pulse: 112  Temp: 98.2 F (36.8 C)  Resp: 20    Body mass index is 39.89 kg/(m^2). Weight: 207 lbs. HEENT:  Sclerae anicteric, conjunctivae pink.   Oropharynx clear.  No mucositis or candidiasis.  No evidence of  frank distention over right jugular vein region. O2 sat 87% with exertion.  Nodes:  No cervical, supraclavicular, or axillary lymphadenopathy palpated.  Lungs:  Diminished throughout, no crackles, rhonchi, or wheezes.   Heart:  Tachycardic, normal rhythm.  Bilateral lower extremity edema.  Abdomen:  Obese, soft, nontender.  Positive bowel sounds.  No organomegaly or masses palpated.  No fluid wave of ascites.  Musculoskeletal:  No focal spinal tenderness to palpation.  Extremities:   1+ pitting edema bilateral lower extremities. No cyanosis.  No upper extremity edema Skin:  Benign.   Neuro:  Nonfocal, alert and oriented x 3.  Lab Results: Lab Results  Component Value Date   WBC 4.8 10/23/2011   HGB 10.5* 10/23/2011   HCT 31.2* 10/23/2011   MCV 99.6 10/23/2011   PLT 303 10/23/2011   NEUTROABS 3.5 10/23/2011   Assessment:  1. Abigail [name] is a 56 year old [name] with recurrent fallopian tube/ovarian carcinoma for which she completed 2 cycles of carboplatin/Taxol, cycle 3 held for one week due to complicated medical history including two hospitalizations following each cycle of chemotherapy. Completed Doxil given every two weeks.  2. History right jugular vein DVT, initially on Arixtra 7.5 mg subcutaneous daily, now on Coumadin, INR 5.3.   Takes 7.5 mg everyday except Tues and Thurs, takes 10 mg.  3. Marked response to therapy with normalization of CA-125.  4. Respiratory status improved after 7 days of Levaquin, followed by Z-pack. Now on beta block per Abigail [name] for tachycardia.  5. Persistent dry cough, improving.  6. INR 5.3 today,  likely effect of Z-pack.   Plan:  1. Per Pharmacy's recommendation, hold Coumadin today and tomorrow, then resume previous dosing schedule, 7.5 mg every day except Tuesday and Thursday, 10 mg.   2. PET scan 9/23.  3. Return week of 9/30 for appt with Abigail [name] with lab  prior. This will be for results of  PET scan and to establish surveillance plan.    Plan is discussed with Abigail [name].     Abigail Harshman, FNP-C

## 2011-10-23 NOTE — Patient Instructions (Addendum)
Hold Coumadin dose tonight and tomorrow night, then resume 7.5 mg daily except Tues and Thurs, take 10 mg.  PET scan on 9/23 at [facility] Return week of 9/30 for appt with Dr. Donnie Coffin with lab prior for results of PET scan and establish surveillance plan.

## 2011-10-24 LAB — CA 125: CA 125: 14.6 U/mL (ref 0.0–30.2)

## 2011-10-25 ENCOUNTER — Ambulatory Visit: Payer: Medicare Other | Admitting: Oncology

## 2011-10-25 ENCOUNTER — Other Ambulatory Visit: Payer: Medicare Other | Admitting: Lab

## 2011-10-29 ENCOUNTER — Encounter (HOSPITAL_COMMUNITY)
Admission: RE | Admit: 2011-10-29 | Discharge: 2011-10-29 | Disposition: A | Discharge status: Home / Self Care | Payer: Medicare Other | Source: Ambulatory Visit | Attending: Physician Assistant | Admitting: Physician Assistant

## 2011-10-29 DIAGNOSIS — C5700 Malignant neoplasm of unspecified fallopian tube: Secondary | ICD-10-CM | POA: Insufficient documentation

## 2011-10-29 DIAGNOSIS — Z1501 Genetic susceptibility to malignant neoplasm of breast: Secondary | ICD-10-CM | POA: Insufficient documentation

## 2011-10-29 LAB — GLUCOSE, CAPILLARY: Glucose-Capillary: 104 mg/dL — ABNORMAL HIGH (ref 70–99)

## 2011-10-29 MED ORDER — FLUDEOXYGLUCOSE F - 18 (FDG) INJECTION
18.8000 | Freq: Once | INTRAVENOUS | Status: AC | PRN
  Administered 2011-10-29: 18.8 via INTRAVENOUS

## 2011-11-05 ENCOUNTER — Other Ambulatory Visit (HOSPITAL_BASED_OUTPATIENT_CLINIC_OR_DEPARTMENT_OTHER): Payer: Medicare Other | Admitting: Lab

## 2011-11-05 ENCOUNTER — Ambulatory Visit (HOSPITAL_BASED_OUTPATIENT_CLINIC_OR_DEPARTMENT_OTHER): Payer: Medicare Other | Admitting: Oncology

## 2011-11-05 ENCOUNTER — Telehealth: Payer: Self-pay | Admitting: *Deleted

## 2011-11-05 VITALS — BP 138/91 | HR 116 | Temp 98.8°F | Resp 20 | Ht 61.0 in | Wt 217.9 lb

## 2011-11-05 DIAGNOSIS — Z7901 Long term (current) use of anticoagulants: Secondary | ICD-10-CM

## 2011-11-05 DIAGNOSIS — O2230 Deep phlebothrombosis in pregnancy, unspecified trimester: Secondary | ICD-10-CM

## 2011-11-05 DIAGNOSIS — R05 Cough: Secondary | ICD-10-CM

## 2011-11-05 DIAGNOSIS — C5700 Malignant neoplasm of unspecified fallopian tube: Secondary | ICD-10-CM

## 2011-11-05 DIAGNOSIS — I82C19 Acute embolism and thrombosis of unspecified internal jugular vein: Secondary | ICD-10-CM

## 2011-11-05 DIAGNOSIS — Z86718 Personal history of other venous thrombosis and embolism: Secondary | ICD-10-CM

## 2011-11-05 LAB — PROTIME-INR
INR: 2.8 (ref 2.00–3.50)
Protime: 33.6 Seconds — ABNORMAL HIGH (ref 10.6–13.4)

## 2011-11-05 NOTE — Telephone Encounter (Signed)
11-16-2011 lab and treatment   12-17-2011 lab and md  Sent michelle email to set up patient's treatment

## 2011-11-05 NOTE — Progress Notes (Signed)
Hematology and Oncology Follow Up Visit  Abigail [name] [id number] [birth year] 56 y.o. 11/05/2011    HPI: 56 year old [name] with recurrent fallopian tube/ovarian carcinoma for which she completed 2 cycles of carboplatin/Taxol, cycle 3 held for one week ago due to complicated medical history including 2 hospitalizations following each cycle of chemotherapy, now completed 6 cycles of every other week Doxil.  2. 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  3. Previous history of breast carcinoma, left breast, April 2009.  4. BRCA positive  Interim History:   Here for lab check and visit only. Last Doxil treatment was 10/17/11. Continues Coumadin 7.5mg  daily except Tues and Thursday, takes 10 mg.INR's have been therapeutic. Denies bleeding or bruising symptoms. No headache or blurred vision. Dry cough is persistent, although improving with Z-pack and cough medicine. She completed Z-pack this morning. Dyspnea is improved. No abdominal pain or new bone pain. Bowel and bladder function are normal. Appetite is good, with adequate fluid intake.  Mild nausea, no vomiting. No lingering significant neuropathy, mild numbness and tingling of the fingertips on her right hand, chronic. Had appt with Dr. Antoine Poche who prescribed beta blocker for tachycardia. He had no explanation for chronic dyspnea. He will continue to monitor. Remainder of the 10 point  review of systems is negative.  Medications:   I have reviewed the patient's current medications.  Allergies:  Allergies  Allergen Reactions  . Lisinopril Cough  . Codeine Itching  . Oxycodone Nausea Only  . Vicodin (Hydrocodone-Acetaminophen) Nausea Only    Physical Exam: Filed Vitals:   11/05/11 1109  BP: 138/91  Pulse: 116  Temp: 98.8 F (37.1 C)  Resp: 20    Body mass index is 41.17 kg/(m^2). Weight: 207 lbs. HEENT:  Sclerae anicteric, conjunctivae pink.   Oropharynx clear.  No mucositis or candidiasis.  No evidence of  frank distention over right jugular vein region. O2 sat 87% with exertion.  Nodes:  No cervical, supraclavicular, or axillary lymphadenopathy palpated.  Lungs:  Diminished throughout, no crackles, rhonchi, or wheezes.   Heart:  Tachycardic, normal rhythm.  Bilateral lower extremity edema.  Abdomen:  Obese, soft, nontender.  Positive bowel sounds.  No organomegaly or masses palpated.  No fluid wave of ascites.  Musculoskeletal:  No focal spinal tenderness to palpation.  Extremities:   1+ pitting edema bilateral lower extremities. No cyanosis.  No upper extremity edema Skin:  Benign.   Neuro:  Nonfocal, alert and oriented x 3.  Lab Results: Lab Results  Component Value Date   WBC 4.8 10/23/2011   HGB 10.5* 10/23/2011   HCT 31.2* 10/23/2011   MCV 99.6 10/23/2011   PLT 303 10/23/2011   NEUTROABS 3.5 10/23/2011   Assessment:  1. Abigail [name] is a 56 year old [name] with recurrent fallopian tube/ovarian carcinoma for which she completed 2 cycles of carboplatin/Taxol, cycle 3 held for one week due to complicated medical history including two hospitalizations following each cycle of chemotherapy. Completed Doxil given every two weeks.  2. History right jugular vein DVT, initially on Arixtra 7.5 mg subcutaneous daily, now on Coumadin, INR 5.3.   Takes 7.5 mg everyday except Tues and Thurs, takes 10 mg.  3. Marked response to therapy with normalization of CA-125.  4. Respiratory status improved after 7 days of Levaquin, followed by Z-pack. Now on beta block per Dr. Antoine Poche for tachycardia.  5. Persistent dry cough, improving.  k.   Plan:  Patient is doing well. I did recommend that she continue Coumadin. As far as her Doxil therapy is concerned think we need to convert her to a surveillance plan. Her most recent appetite show definitive evidence of progression. As such I think we can move her to a Doxil dosing  every month. We'll start next month we'll see her the following month.        Pierce Crane, MD

## 2011-11-16 ENCOUNTER — Other Ambulatory Visit (HOSPITAL_BASED_OUTPATIENT_CLINIC_OR_DEPARTMENT_OTHER): Payer: Medicare Other | Admitting: Lab

## 2011-11-16 ENCOUNTER — Ambulatory Visit (HOSPITAL_BASED_OUTPATIENT_CLINIC_OR_DEPARTMENT_OTHER): Payer: Medicare Other

## 2011-11-16 ENCOUNTER — Other Ambulatory Visit: Payer: Self-pay | Admitting: Oncology

## 2011-11-16 DIAGNOSIS — Z5111 Encounter for antineoplastic chemotherapy: Secondary | ICD-10-CM

## 2011-11-16 DIAGNOSIS — I82409 Acute embolism and thrombosis of unspecified deep veins of unspecified lower extremity: Secondary | ICD-10-CM

## 2011-11-16 DIAGNOSIS — Z5181 Encounter for therapeutic drug level monitoring: Secondary | ICD-10-CM

## 2011-11-16 DIAGNOSIS — Z86718 Personal history of other venous thrombosis and embolism: Secondary | ICD-10-CM

## 2011-11-16 DIAGNOSIS — Z7901 Long term (current) use of anticoagulants: Secondary | ICD-10-CM

## 2011-11-16 DIAGNOSIS — Z1501 Genetic susceptibility to malignant neoplasm of breast: Secondary | ICD-10-CM

## 2011-11-16 DIAGNOSIS — I82C19 Acute embolism and thrombosis of unspecified internal jugular vein: Secondary | ICD-10-CM

## 2011-11-16 DIAGNOSIS — Z853 Personal history of malignant neoplasm of breast: Secondary | ICD-10-CM

## 2011-11-16 DIAGNOSIS — C5700 Malignant neoplasm of unspecified fallopian tube: Secondary | ICD-10-CM

## 2011-11-16 DIAGNOSIS — O2230 Deep phlebothrombosis in pregnancy, unspecified trimester: Secondary | ICD-10-CM

## 2011-11-16 DIAGNOSIS — C569 Malignant neoplasm of unspecified ovary: Secondary | ICD-10-CM

## 2011-11-16 LAB — CBC WITH DIFFERENTIAL/PLATELET
EOS%: 4.1 % (ref 0.0–7.0)
MCH: 30.4 pg (ref 25.1–34.0)
MCHC: 32 g/dL (ref 31.5–36.0)
MCV: 95.2 fL (ref 79.5–101.0)
MONO%: 12 % (ref 0.0–14.0)
RBC: 3.32 10*6/uL — ABNORMAL LOW (ref 3.70–5.45)
RDW: 16.2 % — ABNORMAL HIGH (ref 11.2–14.5)
nRBC: 0 % (ref 0–0)

## 2011-11-16 LAB — COMPREHENSIVE METABOLIC PANEL (CC13)
ALT: 10 U/L (ref 0–55)
Alkaline Phosphatase: 116 U/L (ref 40–150)
Creatinine: 0.8 mg/dL (ref 0.6–1.1)
Sodium: 137 mEq/L (ref 136–145)
Total Bilirubin: 0.5 mg/dL (ref 0.20–1.20)
Total Protein: 6.2 g/dL — ABNORMAL LOW (ref 6.4–8.3)

## 2011-11-16 LAB — PROTIME-INR: INR: 2.3 (ref 2.00–3.50)

## 2011-11-16 MED ORDER — SODIUM CHLORIDE 0.9 % IJ SOLN
10.0000 mL | INTRAMUSCULAR | Status: DC | PRN
  Administered 2011-11-16: 10 mL
  Filled 2011-11-16: qty 10

## 2011-11-16 MED ORDER — SODIUM CHLORIDE 0.9 % IV SOLN
Freq: Once | INTRAVENOUS | Status: AC
  Administered 2011-11-16: 09:00:00 via INTRAVENOUS

## 2011-11-16 MED ORDER — DEXAMETHASONE SODIUM PHOSPHATE 10 MG/ML IJ SOLN
10.0000 mg | Freq: Once | INTRAMUSCULAR | Status: AC
  Administered 2011-11-16: 10 mg via INTRAVENOUS

## 2011-11-16 MED ORDER — ONDANSETRON 8 MG/50ML IVPB (CHCC)
8.0000 mg | Freq: Once | INTRAVENOUS | Status: AC
  Administered 2011-11-16: 8 mg via INTRAVENOUS

## 2011-11-16 MED ORDER — DOXORUBICIN HCL LIPOSOMAL CHEMO INJECTION 2 MG/ML
29.0000 mg/m2 | Freq: Once | INTRAVENOUS | Status: AC
  Administered 2011-11-16: 60 mg via INTRAVENOUS
  Filled 2011-11-16: qty 30

## 2011-11-16 MED ORDER — HEPARIN SOD (PORK) LOCK FLUSH 100 UNIT/ML IV SOLN
500.0000 [IU] | Freq: Once | INTRAVENOUS | Status: AC | PRN
  Administered 2011-11-16: 500 [IU]
  Filled 2011-11-16: qty 5

## 2011-11-16 NOTE — Progress Notes (Signed)
Hematology and Oncology Follow Up Visit  Abigail [name] [id number] 1955-05-02 56 y.o. 11/16/2011    HPI: 56 year old [place] woman with recurrent fallopian tube/ovarian carcinoma for which she completed 2 cycles of carboplatin/Taxol, cycle 3 held for one week ago due to complicated medical history including 2 hospitalizations following each cycle of chemotherapy, now completed 6 cycles of every other week Doxil.  2. 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  3. Previous history of breast carcinoma, left breast, April 2009.  4. BRCA positive  Interim History:   Here for lab check and visit only. Last Doxil treatment was 10/17/11. Continues Coumadin 7.5mg  daily except Tues and Thursday, takes 10 mg.INR's have been therapeutic. Denies bleeding or bruising symptoms. No headache or blurred vision. Dry cough is persistent, although improving with Z-pack and cough medicine. She completed Z-pack this morning. Dyspnea is improved. No abdominal pain or new bone pain. Bowel and bladder function are normal. Appetite is good, with adequate fluid intake.  Mild nausea, no vomiting. No lingering significant neuropathy, mild numbness and tingling of the fingertips on her right hand, chronic. Had appt with Dr. Antoine Poche who prescribed beta blocker for tachycardia. He had no explanation for chronic dyspnea. He will continue to monitor. Remainder of the 10 point  review of systems is negative.  Medications:   I have reviewed the patient's current medications.  Allergies:  Allergies  Allergen Reactions  . Lisinopril Cough  . Codeine Itching  . Oxycodone Nausea Only  . Vicodin (Hydrocodone-Acetaminophen) Nausea Only    Physical Exam: There were no vitals filed for this visit.  There is no height or weight on file to calculate BMI. Weight: 207 lbs. HEENT:  Sclerae anicteric, conjunctivae pink.  Oropharynx clear.  No mucositis or  candidiasis.  No evidence of  frank distention over right jugular vein region. O2 sat 87% with exertion.  Nodes:  No cervical, supraclavicular, or axillary lymphadenopathy palpated.  Lungs:  Diminished throughout, no crackles, rhonchi, or wheezes.   Heart:  Tachycardic, normal rhythm.  Bilateral lower extremity edema.  Abdomen:  Obese, soft, nontender.  Positive bowel sounds.  No organomegaly or masses palpated.  No fluid wave of ascites.  Musculoskeletal:  No focal spinal tenderness to palpation.  Extremities:   1+ pitting edema bilateral lower extremities. No cyanosis.  No upper extremity edema Skin:  Benign.   Neuro:  Nonfocal, alert and oriented x 3.  Lab Results: Lab Results  Component Value Date   WBC 7.5 11/16/2011   HGB 10.1* 11/16/2011   HCT 31.6* 11/16/2011   MCV 95.2 11/16/2011   PLT 272 11/16/2011   NEUTROABS 5.0 11/16/2011   Assessment:  1. Abigail [name] is a 56 year old [name] with recurrent fallopian tube/ovarian carcinoma for which she completed 2 cycles of carboplatin/Taxol, cycle 3 held for one week due to complicated medical history including two hospitalizations following each cycle of chemotherapy. Completed Doxil given every two weeks.  2. History right jugular vein DVT, initially on Arixtra 7.5 mg subcutaneous daily, now on Coumadin, INR 2.3.   Takes 7.5 mg everyday except Tues and Thurs, takes 10 mg.  3. Marked response to therapy with normalization of CA-125.  4. Respiratory status improved after 7 days of Levaquin, followed by Z-pack. Now on beta block per Dr. Antoine Poche for tachycardia.  5. Persistent dry cough, improving.  k.   Plan:  Patient is doing well. I did recommend that she  continue Coumadin. As far as her Doxil therapy is concerned think we need to convert her to a surveillance plan. Her most recent appetite show definitive evidence of progression. As such I think we can move her to a Doxil dosing every month. We'll start next  month we'll see her the following month.        Abigail Klatt, md

## 2011-11-16 NOTE — Patient Instructions (Signed)
[facility] Discharge Instructions for Patients Receiving Chemotherapy  Today you received the following chemotherapy agents Doxil   To help prevent nausea and vomiting after your treatment, we encourage you to take your nausea medication as directed.   If you develop nausea and vomiting that is not controlled by your nausea medication, call the clinic. If it is after clinic hours your family physician or the after hours number for the clinic or go to the Emergency Department.   BELOW ARE SYMPTOMS THAT SHOULD BE REPORTED IMMEDIATELY:  *FEVER GREATER THAN 100.5 F  *CHILLS WITH OR WITHOUT FEVER  NAUSEA AND VOMITING THAT IS NOT CONTROLLED WITH YOUR NAUSEA MEDICATION  *UNUSUAL SHORTNESS OF BREATH  *UNUSUAL BRUISING OR BLEEDING  TENDERNESS IN MOUTH AND THROAT WITH OR WITHOUT PRESENCE OF ULCERS  *URINARY PROBLEMS  *BOWEL PROBLEMS  UNUSUAL RASH Items with * indicate a potential emergency and should be followed up as soon as possible.  One of the nurses will contact you 24 hours after your treatment. Please let the nurse know about any problems that you may have experienced. Feel free to call the clinic you have any questions or concerns. The clinic phone number is [phone].   I have been informed and understand all the instructions given to me. I know to contact the clinic, my physician, or go to the Emergency Department if any problems should occur. I do not have any questions at this time, but understand that I may call the clinic during office hours   should I have any questions or need assistance in obtaining follow up care.    __________________________________________  _____________  __________ Signature of Patient or Authorized Representative            Date                   Time    __________________________________________ Nurse's Signature    

## 2011-11-30 ENCOUNTER — Encounter: Payer: Self-pay | Admitting: Family Medicine

## 2011-11-30 ENCOUNTER — Ambulatory Visit (INDEPENDENT_AMBULATORY_CARE_PROVIDER_SITE_OTHER): Payer: Medicare Other | Admitting: Family Medicine

## 2011-11-30 VITALS — BP 140/84 | HR 100 | Temp 98.9°F | Ht 61.0 in | Wt 224.0 lb

## 2011-11-30 DIAGNOSIS — J309 Allergic rhinitis, unspecified: Secondary | ICD-10-CM

## 2011-11-30 DIAGNOSIS — R05 Cough: Secondary | ICD-10-CM

## 2011-11-30 DIAGNOSIS — J209 Acute bronchitis, unspecified: Secondary | ICD-10-CM

## 2011-11-30 MED ORDER — BENZONATATE 200 MG PO CAPS: 200.0000 mg | ORAL_CAPSULE | Freq: Three times a day (TID) | ORAL | Status: DC | PRN

## 2011-11-30 MED ORDER — AZITHROMYCIN 250 MG PO TABS: ORAL_TABLET | ORAL | Status: DC

## 2011-11-30 NOTE — Progress Notes (Signed)
Chief Complaint  Patient presents with  . Cough    x 3 weeks, is having some SOB upon exertion. Pt would like flu shot, I thought you may want to examine her first.   HPI: Started like a cold around 10/11.  She tried OTC cough syrup, and it was getting better for a bit, but started getting worse again 8 days ago.  Cough is usually dry, nonproductive, but gets up phlegm in the mornings only, yellowish in color.  Has sore throat just in the mornings. Having runny nose (clear mucus) and occasional sneeze. She reports having "a touch of pneumonia" recently. CXR in computer shows normal x-ray (NAD) 09/27/11.  Robitussin DM helped initially, but no is no longer effective.  Denies fevers, nausea, vomiting, skin rash.  Denies sick contacts.  Denies h/o allergies. Getting chemo once a month, last 10/11.  Past Medical History  Diagnosis Date  . Hypertension   . Obesity   . Diverticulitis   . Hemorrhoids   . Colonic polyp   . GERD (gastroesophageal reflux disease)   . Cancer     BREAST, FALLOPIAN TUBE,  . Blood clot of neck vein    Past Surgical History  Procedure Date  . Tonsillectomy and adenoidectomy   . Breast lumpectomy   . Abdominal hysterectomy     TAH, BSO  . Port a cath     05/29/11   History   Social History  . Marital Status: Divorced    Spouse Name: N/A    Number of Children: N/A  . Years of Education: N/A   Occupational History  . Disabled    Social History Main Topics  . Smoking status: Former Smoker -- 0.5 packs/day for 7 years    Types: Cigarettes    Quit date: 08/07/2006  . Smokeless tobacco: Never Used  . Alcohol Use: No  . Drug Use: No  . Sexually Active: No   Other Topics Concern  . Not on file   Social History Narrative   Lives alone.  Three children.     Current outpatient prescriptions:amLODipine (NORVASC) 5 MG tablet, Take 1 tablet (5 mg total) by mouth daily., Disp: 30 tablet, Rfl: 11;  Cholecalciferol (VITAMIN D3) 3000 UNITS TABS, Take 3,000  Units by mouth daily. , Disp: , Rfl: ;  dexamethasone (DECADRON) 4 MG tablet, Take 2 tablets by mouth daily starting the day after chemotherapy for 2 days. Take with food., Disp: 30 tablet, Rfl: 1 KLOR-CON M20 20 MEQ tablet, TAKE 1 TABLET BY MOUTH EVERY DAY, Disp: 30 tablet, Rfl: 1;  metoprolol succinate (TOPROL XL) 50 MG 24 hr tablet, Take 1 tablet (50 mg total) by mouth daily. Take with or immediately following a meal., Disp: 30 tablet, Rfl: 11;  warfarin (COUMADIN) 5 MG tablet, Take 7.5 mg by mouth daily. 1.5 tabs everyday , Disp: , Rfl: ;  lidocaine-prilocaine (EMLA) cream, Apply topically as needed. For PAC, Disp: 30 g, Rfl: 1 LORazepam (ATIVAN) 0.5 MG tablet, Take 1 tablet (0.5 mg total) by mouth every 6 (six) hours as needed (Nausea or vomiting)., Disp: 30 tablet, Rfl: 0;  ondansetron (ZOFRAN) 8 MG tablet, Take 1 tablet two times a day starting the day after chemo for 2 days. Then take 1 tablet two times a day as needed for nausea or vomiting., Disp: 30 tablet, Rfl: 1 prochlorperazine (COMPAZINE) 10 MG tablet, Take 1 tablet (10 mg total) by mouth every 6 (six) hours as needed (Nausea or vomiting)., Disp: 30 tablet, Rfl: 1;  prochlorperazine (COMPAZINE) 25 MG suppository, Place 25 mg rectally every 12 (twelve) hours as needed., Disp: , Rfl:  No current facility-administered medications for this visit. Facility-Administered Medications Ordered in Other Visits: fondaparinux (ARIXTRA) injection 7.5 mg, 7.5 mg, Subcutaneous, Q24H, Pierce Crane, MD, 7.5 mg at 07/17/11 1327  Allergies  Allergen Reactions  . Lisinopril Cough  . Codeine Itching  . Oxycodone Nausea Only  . Vicodin (Hydrocodone-Acetaminophen) Nausea Only   ROS:  Denies fevers, nausea, vomiting, abdominal pain, diarrhea, skin rash, swelling, uinary complaints or other concerns except per HPI.  PHYSICAL EXAM: BP 140/84  Pulse 100  Temp 98.9 F (37.2 C) (Oral)  Ht 5\' 1"  (1.549 m)  Wt 224 lb (101.606 kg)  BMI 42.32 kg/m2  SpO2  97% Well developed, pleasant obese female, speaking easily in no distress, rare cough. HEENT:  PERRL, EOMI, conjunctiva clear.  TM's and EAC's normal.  OP clear.  Nose--clear mucus, mild edema.  Sinuses nontender. Neck: no lymphadenopathy or mass Heart: regular rate and rhythm without murmur Lungs: clear, with good air movement, but very coarse breath sounds at bases bilaterally.  No rales or wheezing. Skin: no rash Psych: normal mood, affect  ASSESSMENT/PLAN:  1. Acute bronchitis  azithromycin (ZITHROMAX) 250 MG tablet  2. Allergic rhinitis, cause unspecified    3. Cough  benzonatate (TESSALON) 200 MG capsule   Bronchitis, acute, in patient undergoing monthly chemo treatments, on coumadin.  She likely also has a component of allergies  Z-pak claritin or Zyrtec (no D versions) Tessalon perles prn for cough.  Discussed the interaction of z-pak with coumadin, and to contact cancer center to have dose changed and f/u arranged (discussed using Z-pak vs amoxacillin, latter of which wouldn'thave interaction, but possibly not as effective in treating the bronchitis--she recalls previously taking z-pak and prefers to take z-pak and f/u re: coumadin PT/INR testing)

## 2011-11-30 NOTE — Patient Instructions (Addendum)
Please call the cancer center and let them know that you were put on an antibiotic for bronchitis that interacts with the coumadin.  Coumadin dose may need to be adjusted, and a closer follow-up for recheck of protime is recommended.  Start taking claritin or zyrtec (not the D version, as this will raise blood pressure).  Take this once daily for allergies (clear runny nose).  Use the tessalon as needed for cough.  If this isn't effective for you, then go back to just using the Robitussin DM.  Take the antibiotics as directed--remember that although you only take it for 5 days, it continues to work for 10 days.  Return if you are having worsening cough, shortness of breath, fevers, or other problems.

## 2011-12-03 ENCOUNTER — Other Ambulatory Visit: Payer: Self-pay | Admitting: Emergency Medicine

## 2011-12-03 DIAGNOSIS — Z853 Personal history of malignant neoplasm of breast: Secondary | ICD-10-CM

## 2011-12-03 DIAGNOSIS — I82409 Acute embolism and thrombosis of unspecified deep veins of unspecified lower extremity: Secondary | ICD-10-CM

## 2011-12-14 ENCOUNTER — Other Ambulatory Visit (HOSPITAL_BASED_OUTPATIENT_CLINIC_OR_DEPARTMENT_OTHER): Payer: Medicare Other | Admitting: Lab

## 2011-12-14 ENCOUNTER — Other Ambulatory Visit: Payer: Self-pay | Admitting: Physician Assistant

## 2011-12-14 ENCOUNTER — Ambulatory Visit (HOSPITAL_BASED_OUTPATIENT_CLINIC_OR_DEPARTMENT_OTHER): Payer: Medicare Other

## 2011-12-14 VITALS — BP 138/86 | HR 86 | Temp 98.1°F | Resp 20

## 2011-12-14 DIAGNOSIS — Z853 Personal history of malignant neoplasm of breast: Secondary | ICD-10-CM

## 2011-12-14 DIAGNOSIS — C5700 Malignant neoplasm of unspecified fallopian tube: Secondary | ICD-10-CM

## 2011-12-14 DIAGNOSIS — C569 Malignant neoplasm of unspecified ovary: Secondary | ICD-10-CM

## 2011-12-14 DIAGNOSIS — Z86718 Personal history of other venous thrombosis and embolism: Secondary | ICD-10-CM

## 2011-12-14 DIAGNOSIS — Z1501 Genetic susceptibility to malignant neoplasm of breast: Secondary | ICD-10-CM

## 2011-12-14 DIAGNOSIS — I82409 Acute embolism and thrombosis of unspecified deep veins of unspecified lower extremity: Secondary | ICD-10-CM

## 2011-12-14 DIAGNOSIS — O2230 Deep phlebothrombosis in pregnancy, unspecified trimester: Secondary | ICD-10-CM

## 2011-12-14 DIAGNOSIS — Z5111 Encounter for antineoplastic chemotherapy: Secondary | ICD-10-CM

## 2011-12-14 LAB — CBC WITH DIFFERENTIAL/PLATELET
Basophils Absolute: 0 10*3/uL (ref 0.0–0.1)
Eosinophils Absolute: 0.2 10*3/uL (ref 0.0–0.5)
HGB: 10.1 g/dL — ABNORMAL LOW (ref 11.6–15.9)
MCV: 94.6 fL (ref 79.5–101.0)
MONO#: 1.1 10*3/uL — ABNORMAL HIGH (ref 0.1–0.9)
MONO%: 12 % (ref 0.0–14.0)
NEUT#: 6.4 10*3/uL (ref 1.5–6.5)
Platelets: 201 10*3/uL (ref 145–400)
RDW: 16.3 % — ABNORMAL HIGH (ref 11.2–14.5)
WBC: 8.8 10*3/uL (ref 3.9–10.3)

## 2011-12-14 LAB — PROTIME-INR: INR: 2.1 (ref 2.00–3.50)

## 2011-12-14 MED ORDER — DOXORUBICIN HCL LIPOSOMAL CHEMO INJECTION 2 MG/ML
60.0000 mg | Freq: Once | INTRAVENOUS | Status: AC
  Administered 2011-12-14: 60 mg via INTRAVENOUS
  Filled 2011-12-14: qty 30

## 2011-12-14 MED ORDER — SODIUM CHLORIDE 0.9 % IV SOLN
Freq: Once | INTRAVENOUS | Status: AC
  Administered 2011-12-14: 14:00:00 via INTRAVENOUS

## 2011-12-14 MED ORDER — DEXAMETHASONE SODIUM PHOSPHATE 10 MG/ML IJ SOLN
10.0000 mg | Freq: Once | INTRAMUSCULAR | Status: AC
  Administered 2011-12-14: 10 mg via INTRAVENOUS

## 2011-12-14 MED ORDER — HEPARIN SOD (PORK) LOCK FLUSH 100 UNIT/ML IV SOLN
500.0000 [IU] | Freq: Once | INTRAVENOUS | Status: AC | PRN
  Administered 2011-12-14: 500 [IU]
  Filled 2011-12-14: qty 5

## 2011-12-14 MED ORDER — SODIUM CHLORIDE 0.9 % IJ SOLN
10.0000 mL | INTRAMUSCULAR | Status: DC | PRN
  Administered 2011-12-14: 10 mL
  Filled 2011-12-14: qty 10

## 2011-12-14 MED ORDER — ONDANSETRON 8 MG/50ML IVPB (CHCC)
8.0000 mg | Freq: Once | INTRAVENOUS | Status: AC
  Administered 2011-12-14: 8 mg via INTRAVENOUS

## 2011-12-14 NOTE — Patient Instructions (Signed)
[facility] Discharge Instructions for Patients Receiving Chemotherapy  Today you received the following chemotherapy agents Doxil To help prevent nausea and vomiting after your treatment, we encourage you to take your nausea medication as prescribed.If you develop nausea and vomiting that is not controlled by your nausea medication, call the clinic. If it is after clinic hours your family physician or the after hours number for the clinic or go to the Emergency Department.   BELOW ARE SYMPTOMS THAT SHOULD BE REPORTED IMMEDIATELY:  *FEVER GREATER THAN 100.5 F  *CHILLS WITH OR WITHOUT FEVER  NAUSEA AND VOMITING THAT IS NOT CONTROLLED WITH YOUR NAUSEA MEDICATION  *UNUSUAL SHORTNESS OF BREATH  *UNUSUAL BRUISING OR BLEEDING  TENDERNESS IN MOUTH AND THROAT WITH OR WITHOUT PRESENCE OF ULCERS  *URINARY PROBLEMS  *BOWEL PROBLEMS  UNUSUAL RASH Items with * indicate a potential emergency and should be followed up as soon as possible.  One of the nurses will contact you 24 hours after your treatment. Please let the nurse know about any problems that you may have experienced. Feel free to call the clinic you have any questions or concerns. The clinic phone number is [phone].   I have been informed and understand all the instructions given to me. I know to contact the clinic, my physician, or go to the Emergency Department if any problems should occur. I do not have any questions at this time, but understand that I may call the clinic during office hours   should I have any questions or need assistance in obtaining follow up care.    __________________________________________  _____________  __________ Signature of Patient or Authorized Representative            Date                   Time    __________________________________________ Nurse's Signature    

## 2011-12-17 ENCOUNTER — Telehealth: Payer: Self-pay | Admitting: *Deleted

## 2011-12-17 ENCOUNTER — Ambulatory Visit (HOSPITAL_BASED_OUTPATIENT_CLINIC_OR_DEPARTMENT_OTHER): Payer: Medicare Other | Admitting: Oncology

## 2011-12-17 ENCOUNTER — Other Ambulatory Visit (HOSPITAL_BASED_OUTPATIENT_CLINIC_OR_DEPARTMENT_OTHER): Payer: Medicare Other | Admitting: Lab

## 2011-12-17 VITALS — BP 143/90 | HR 112 | Temp 98.2°F | Resp 20 | Ht 61.0 in | Wt 228.4 lb

## 2011-12-17 DIAGNOSIS — O2230 Deep phlebothrombosis in pregnancy, unspecified trimester: Secondary | ICD-10-CM

## 2011-12-17 DIAGNOSIS — I82C19 Acute embolism and thrombosis of unspecified internal jugular vein: Secondary | ICD-10-CM

## 2011-12-17 DIAGNOSIS — C5700 Malignant neoplasm of unspecified fallopian tube: Secondary | ICD-10-CM

## 2011-12-17 DIAGNOSIS — C569 Malignant neoplasm of unspecified ovary: Secondary | ICD-10-CM

## 2011-12-17 DIAGNOSIS — Z7901 Long term (current) use of anticoagulants: Secondary | ICD-10-CM

## 2011-12-17 DIAGNOSIS — Z1501 Genetic susceptibility to malignant neoplasm of breast: Secondary | ICD-10-CM

## 2011-12-17 LAB — CBC WITH DIFFERENTIAL/PLATELET
Basophils Absolute: 0 10*3/uL (ref 0.0–0.1)
Eosinophils Absolute: 0 10*3/uL (ref 0.0–0.5)
HGB: 10.2 g/dL — ABNORMAL LOW (ref 11.6–15.9)
MCV: 94.3 fL (ref 79.5–101.0)
MONO#: 0.9 10*3/uL (ref 0.1–0.9)
NEUT#: 11.8 10*3/uL — ABNORMAL HIGH (ref 1.5–6.5)
RBC: 3.48 10*6/uL — ABNORMAL LOW (ref 3.70–5.45)
RDW: 16.4 % — ABNORMAL HIGH (ref 11.2–14.5)
WBC: 13.2 10*3/uL — ABNORMAL HIGH (ref 3.9–10.3)
lymph#: 0.5 10*3/uL — ABNORMAL LOW (ref 0.9–3.3)

## 2011-12-17 LAB — COMPREHENSIVE METABOLIC PANEL (CC13)
Albumin: 3.2 g/dL — ABNORMAL LOW (ref 3.5–5.0)
BUN: 19 mg/dL (ref 7.0–26.0)
CO2: 25 mEq/L (ref 22–29)
Calcium: 9.3 mg/dL (ref 8.4–10.4)
Chloride: 105 mEq/L (ref 98–107)
Glucose: 171 mg/dl — ABNORMAL HIGH (ref 70–99)
Potassium: 3.3 mEq/L — ABNORMAL LOW (ref 3.5–5.1)
Sodium: 138 mEq/L (ref 136–145)
Total Protein: 6.7 g/dL (ref 6.4–8.3)

## 2011-12-17 NOTE — Progress Notes (Signed)
Hematology and Oncology Follow Up Visit  Abigail [name] [id number] [birth year] 56 y.o. 12/17/2011    HPI: 56 year old [place] woman with recurrent fallopian tube/ovarian carcinoma for which she completed 2 cycles of carboplatin/Taxol, cycle 3 held for one week ago due to complicated medical history including 2 hospitalizations following each cycle of chemotherapy, now completed 6 cycles of every other week Doxil. Currently receiving monthly Doxil 30 mg per meters squared   2. 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  3. Previous history of breast carcinoma, left breast, April 2009.  4. BRCA positive  Interim History:   Patient returns for followup and ultimately well she's had a cough congestion is been on some in the tests of type medication I do not believe she is taking any antibiotics. She has not had a fever. She did take her chemotherapy on Friday on 12/14/2011. She is doing well. His other complaints. She has had a mammogram this past May to she is on a stable dose of Coumadin with a stable INR. Her most recent CA 125 was 14.6 as of 10/23/2011.Marland Kitchen Remainder of the 10 point  review of systems is negative.  Medications:   I have reviewed the patient's current medications.  Allergies:  Allergies  Allergen Reactions  . Lisinopril Cough  . Codeine Itching  . Oxycodone Nausea Only  . Vicodin (Hydrocodone-Acetaminophen) Nausea Only    Physical Exam: Filed Vitals:   12/17/11 1037  BP: 143/90  Pulse: 112  Temp: 98.2 F (36.8 C)  Resp: 20    Body mass index is 43.16 kg/(m^2). Weight: 207 lbs. HEENT:  Sclerae anicteric, conjunctivae pink.  Oropharynx clear.  No mucositis or candidiasis.  No evidence of  frank distention over right jugular vein region. O2 sat 87% with exertion.  Nodes:  No cervical, supraclavicular, or axillary lymphadenopathy palpated.  Lungs:  Diminished throughout, no crackles,  rhonchi, or wheezes.   Heart:  Tachycardic, normal rhythm.  Bilateral lower extremity edema.  Abdomen:  Obese, soft, nontender.  Positive bowel sounds.  No organomegaly or masses palpated.  No fluid wave of ascites.  Musculoskeletal:  No focal spinal tenderness to palpation.  Extremities:   1+ pitting edema bilateral lower extremities. No cyanosis.  No upper extremity edema Skin:  Benign.   Neuro:  Nonfocal, alert and oriented x 3.  Lab Results: Lab Results  Component Value Date   WBC 13.2* 12/17/2011   HGB 10.2* 12/17/2011   HCT 32.8* 12/17/2011   MCV 94.3 12/17/2011   PLT 295 12/17/2011   NEUTROABS 11.8* 12/17/2011   Assessment:  1. Abigail [name] is a 56 year old [name] with recurrent fallopian tube/ovarian carcinoma for which she completed 2 cycles of carboplatin/Taxol, cycle 3 held for one week due to complicated medical history including two hospitalizations following each cycle of chemotherapy. Completed Doxil given every two weeks. 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 History of BRCA2 abnormality with positive previous history of breast cancer.Marland Kitchen  k.   Plan:  Patient is doing well. I did recommend that she continue Coumadin. She'll return on December 6 for the next dose of Doxil.    Abigail Wormley, md

## 2011-12-17 NOTE — Telephone Encounter (Signed)
Gave patient new time but the same day on 01-11-2012  Emailed michelle and tanya to adjust the patient treatment time

## 2012-01-06 ENCOUNTER — Other Ambulatory Visit: Payer: Self-pay | Admitting: Oncology

## 2012-01-11 ENCOUNTER — Ambulatory Visit (HOSPITAL_BASED_OUTPATIENT_CLINIC_OR_DEPARTMENT_OTHER): Payer: Medicare Other | Admitting: Oncology

## 2012-01-11 ENCOUNTER — Ambulatory Visit (INDEPENDENT_AMBULATORY_CARE_PROVIDER_SITE_OTHER): Payer: Medicare Other | Admitting: Cardiology

## 2012-01-11 ENCOUNTER — Telehealth: Payer: Self-pay | Admitting: Oncology

## 2012-01-11 ENCOUNTER — Encounter: Payer: Self-pay | Admitting: Cardiology

## 2012-01-11 ENCOUNTER — Other Ambulatory Visit (HOSPITAL_BASED_OUTPATIENT_CLINIC_OR_DEPARTMENT_OTHER): Payer: Medicare Other | Admitting: Lab

## 2012-01-11 ENCOUNTER — Ambulatory Visit (HOSPITAL_BASED_OUTPATIENT_CLINIC_OR_DEPARTMENT_OTHER): Payer: Medicare Other

## 2012-01-11 VITALS — BP 138/90 | HR 106 | Ht 61.0 in | Wt 226.6 lb

## 2012-01-11 VITALS — BP 144/96 | HR 118 | Temp 98.5°F | Resp 20 | Ht 61.0 in | Wt 227.1 lb

## 2012-01-11 DIAGNOSIS — C5700 Malignant neoplasm of unspecified fallopian tube: Secondary | ICD-10-CM

## 2012-01-11 DIAGNOSIS — O2230 Deep phlebothrombosis in pregnancy, unspecified trimester: Secondary | ICD-10-CM

## 2012-01-11 DIAGNOSIS — C569 Malignant neoplasm of unspecified ovary: Secondary | ICD-10-CM

## 2012-01-11 DIAGNOSIS — I82C19 Acute embolism and thrombosis of unspecified internal jugular vein: Secondary | ICD-10-CM

## 2012-01-11 DIAGNOSIS — Z5111 Encounter for antineoplastic chemotherapy: Secondary | ICD-10-CM

## 2012-01-11 DIAGNOSIS — I82409 Acute embolism and thrombosis of unspecified deep veins of unspecified lower extremity: Secondary | ICD-10-CM

## 2012-01-11 DIAGNOSIS — Z853 Personal history of malignant neoplasm of breast: Secondary | ICD-10-CM

## 2012-01-11 DIAGNOSIS — Z1501 Genetic susceptibility to malignant neoplasm of breast: Secondary | ICD-10-CM

## 2012-01-11 DIAGNOSIS — I498 Other specified cardiac arrhythmias: Secondary | ICD-10-CM

## 2012-01-11 LAB — CBC WITH DIFFERENTIAL/PLATELET
BASO%: 0.3 % (ref 0.0–2.0)
Eosinophils Absolute: 0.2 10*3/uL (ref 0.0–0.5)
LYMPH%: 14.8 % (ref 14.0–49.7)
MCHC: 32.4 g/dL (ref 31.5–36.0)
MONO#: 0.9 10*3/uL (ref 0.1–0.9)
MONO%: 9.6 % (ref 0.0–14.0)
NEUT#: 7 10*3/uL — ABNORMAL HIGH (ref 1.5–6.5)
Platelets: 273 10*3/uL (ref 145–400)
RBC: 3.49 10*6/uL — ABNORMAL LOW (ref 3.70–5.45)
RDW: 16.7 % — ABNORMAL HIGH (ref 11.2–14.5)
WBC: 9.6 10*3/uL (ref 3.9–10.3)
nRBC: 0 % (ref 0–0)

## 2012-01-11 LAB — COMPREHENSIVE METABOLIC PANEL (CC13)
BUN: 7 mg/dL (ref 7.0–26.0)
CO2: 27 mEq/L (ref 22–29)
Calcium: 9.6 mg/dL (ref 8.4–10.4)
Chloride: 102 mEq/L (ref 98–107)
Creatinine: 0.8 mg/dL (ref 0.6–1.1)
Total Bilirubin: 0.36 mg/dL (ref 0.20–1.20)

## 2012-01-11 LAB — PROTIME-INR: Protime: 24 Seconds — ABNORMAL HIGH (ref 10.6–13.4)

## 2012-01-11 MED ORDER — HEPARIN SOD (PORK) LOCK FLUSH 100 UNIT/ML IV SOLN
500.0000 [IU] | Freq: Once | INTRAVENOUS | Status: AC | PRN
  Administered 2012-01-11: 500 [IU]
  Filled 2012-01-11: qty 5

## 2012-01-11 MED ORDER — DOXORUBICIN HCL LIPOSOMAL CHEMO INJECTION 2 MG/ML
60.0000 mg | Freq: Once | INTRAVENOUS | Status: AC
  Administered 2012-01-11: 60 mg via INTRAVENOUS
  Filled 2012-01-11: qty 30

## 2012-01-11 MED ORDER — DEXAMETHASONE SODIUM PHOSPHATE 10 MG/ML IJ SOLN
10.0000 mg | Freq: Once | INTRAMUSCULAR | Status: AC
  Administered 2012-01-11: 10 mg via INTRAVENOUS

## 2012-01-11 MED ORDER — SODIUM CHLORIDE 0.9 % IV SOLN
Freq: Once | INTRAVENOUS | Status: AC
  Administered 2012-01-11: 14:00:00 via INTRAVENOUS

## 2012-01-11 MED ORDER — SODIUM CHLORIDE 0.9 % IJ SOLN
10.0000 mL | INTRAMUSCULAR | Status: DC | PRN
  Administered 2012-01-11: 10 mL
  Filled 2012-01-11: qty 10

## 2012-01-11 MED ORDER — ONDANSETRON 8 MG/50ML IVPB (CHCC)
8.0000 mg | Freq: Once | INTRAVENOUS | Status: AC
  Administered 2012-01-11: 8 mg via INTRAVENOUS

## 2012-01-11 MED ORDER — METOPROLOL SUCCINATE ER 100 MG PO TB24: 50.0000 mg | ORAL_TABLET | Freq: Every day | ORAL | Status: DC

## 2012-01-11 NOTE — Progress Notes (Signed)
HPI The patient presents as a new patient for evaluation of dyspnea and tachycardia. She has had an extensive workup of this with her pulmonologist. In short her echo was unremarkable. I sent her for stress perfusion study which demonstrated no ischemia or infarct.  She has since had pulmonary function testing. There were no overt abnormalities on this.  Plus, her dyspnea was felt to be multifactorial and no other workup is planned.   She has also had a sinus tachycardia. Holter did demonstrate that her heart rate does go down when she sleeps. There is not felt to be any ectopic or reentrant etiology. I started her on a low dose of beta blocker at the last visit. She had no problems taking this. She has had no presyncope or syncope. She denies any chest pressure, neck or arm discomfort. She has her baseline dyspnea but no PND or orthopnea. She's mainly complaining of fatigue.  Allergies  Allergen Reactions  . Lisinopril Cough  . Codeine Itching  . Oxycodone Nausea Only  . Vicodin (Hydrocodone-Acetaminophen) Nausea Only    Current Outpatient Prescriptions  Medication Sig Dispense Refill  . amLODipine (NORVASC) 5 MG tablet Take 1 tablet (5 mg total) by mouth daily.  30 tablet  11  . benzonatate (TESSALON) 200 MG capsule Take 1 capsule (200 mg total) by mouth 3 (three) times daily as needed for cough.  20 capsule  0  . Cholecalciferol (VITAMIN D3) 3000 UNITS TABS Take 3,000 Units by mouth daily.       Marland Kitchen dexamethasone (DECADRON) 4 MG tablet Take 2 tablets by mouth daily starting the day after chemotherapy for 2 days. Take with food.  30 tablet  1  . KLOR-CON M20 20 MEQ tablet TAKE 1 TABLET BY MOUTH EVERY DAY  30 tablet  1  . lidocaine-prilocaine (EMLA) cream Apply topically as needed. For PAC  30 g  1  . LORazepam (ATIVAN) 0.5 MG tablet Take 1 tablet (0.5 mg total) by mouth every 6 (six) hours as needed (Nausea or vomiting).  30 tablet  0  . metoprolol succinate (TOPROL XL) 50 MG 24 hr tablet  Take 1 tablet (50 mg total) by mouth daily. Take with or immediately following a meal.  30 tablet  11  . ondansetron (ZOFRAN) 8 MG tablet Take 1 tablet two times a day starting the day after chemo for 2 days. Then take 1 tablet two times a day as needed for nausea or vomiting.  30 tablet  1  . prochlorperazine (COMPAZINE) 10 MG tablet Take 1 tablet (10 mg total) by mouth every 6 (six) hours as needed (Nausea or vomiting).  30 tablet  1  . prochlorperazine (COMPAZINE) 25 MG suppository Place 25 mg rectally every 12 (twelve) hours as needed.      . warfarin (COUMADIN) 5 MG tablet Take 7.5 mg by mouth daily. 1.5 tabs everyday       . warfarin (COUMADIN) 5 MG tablet TAKE 1 OR 1 & 1/2 TABLET BY MOUTH ONCE DAILY OR AS DIRECTED  60 tablet  3    Past Medical History  Diagnosis Date  . Hypertension   . Obesity   . Diverticulitis   . Hemorrhoids   . Colonic polyp   . GERD (gastroesophageal reflux disease)   . Cancer     BREAST, FALLOPIAN TUBE,  . Blood clot of neck vein     Past Surgical History  Procedure Date  . Tonsillectomy and adenoidectomy   . Breast  lumpectomy   . Abdominal hysterectomy     TAH, BSO  . Port a cath     05/29/11    ROS:  As stated in the HPI and negative for all other systems.  PHYSICAL EXAM BP 138/90  Pulse 106  Ht 5\' 1"  (1.549 m)  Wt 226 lb 9.6 oz (102.785 kg)  BMI 42.82 kg/m2  SpO2 93% GENERAL:  Well appearing HEENT:  Pupils equal round and reactive, fundi not visualized, oral mucosa unremarkable, poor dentition NECK:  No jugular venous distention, waveform within normal limits, carotid upstroke brisk and symmetric, no bruits, no thyromegaly LUNGS:  Clear to auscultation bilaterally CHEST:  Unremarkable HEART:  PMI not displaced or sustained,S1 and S2 within normal limits, no S3, no S4, no clicks, no rubs, no murmurs ABD:  Flat, positive bowel sounds normal in frequency in pitch, no bruits, no rebound, no guarding, no midline pulsatile mass, no  hepatomegaly, no splenomegaly, obese  EKG:  Sinus tachycardia, rate 105, left axis deviation, poor anterior R wave progression.  ASSESSMENT AND PLAN  Dyspnea -  The etiology of  this is likely multifactorial.  No further work up is planned.    Sinus tachycardia - She tolerated the addition of a low-dose beta blocker. I will increase this to 100 mg Toprol daily.  HTN - This is being treated in the context treating her tachycardia.

## 2012-01-11 NOTE — Patient Instructions (Addendum)
[facility] Discharge Instructions for Patients Receiving Chemotherapy  Today you received the following chemotherapy agents: doxil  To help prevent nausea and vomiting after your treatment, we encourage you to take your nausea medication.  Take it as often as prescribed.     If you develop nausea and vomiting that is not controlled by your nausea medication, call the clinic. If it is after clinic hours your family physician or the after hours number for the clinic or go to the Emergency Department.   BELOW ARE SYMPTOMS THAT SHOULD BE REPORTED IMMEDIATELY:  *FEVER GREATER THAN 100.5 F  *CHILLS WITH OR WITHOUT FEVER  NAUSEA AND VOMITING THAT IS NOT CONTROLLED WITH YOUR NAUSEA MEDICATION  *UNUSUAL SHORTNESS OF BREATH  *UNUSUAL BRUISING OR BLEEDING  TENDERNESS IN MOUTH AND THROAT WITH OR WITHOUT PRESENCE OF ULCERS  *URINARY PROBLEMS  *BOWEL PROBLEMS  UNUSUAL RASH Items with * indicate a potential emergency and should be followed up as soon as possible.  Feel free to call the clinic you have any questions or concerns. The clinic phone number is [phone].   I have been informed and understand all the instructions given to me. I know to contact the clinic, my physician, or go to the Emergency Department if any problems should occur. I do not have any questions at this time, but understand that I may call the clinic during office hours   should I have any questions or need assistance in obtaining follow up care.    __________________________________________  _____________  __________ Signature of Patient or Authorized Representative            Date                   Time    __________________________________________ Nurse's Signature    

## 2012-01-11 NOTE — Progress Notes (Signed)
Hematology and Oncology Follow Up Visit  Abigail [name] [id number] 1955/09/15 56 y.o. 01/11/2012    HPI: 56 year old [place] woman with recurrent fallopian tube/ovarian carcinoma for which she completed 2 cycles of carboplatin/Taxol, cycle 3 held for one week ago due to complicated medical history including 2 hospitalizations following each cycle of chemotherapy, now completed 6 cycles of every other week Doxil. Currently receiving monthly Doxil 30 mg per meters squared   2. 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  3. Previous history of breast carcinoma, left breast, April 2009.  4. BRCA positive  Interim History:   Patient returns for followup and ultimately is doing well. She has no complaints. Her PT have been therapeutic. She has no severe s/e from the doxil. She denies neuropathy/ or  Other issues.   Medications:   I have reviewed the patient's current medications.  Allergies:  Allergies  Allergen Reactions  . Lisinopril Cough  . Codeine Itching  . Oxycodone Nausea Only  . Vicodin (Hydrocodone-Acetaminophen) Nausea Only    Physical Exam: Filed Vitals:   01/11/12 1237  BP: 144/96  Pulse: 118  Temp: 98.5 F (36.9 C)  Resp: 20    Body mass index is 42.91 kg/(m^2). Weight: 207 lbs. HEENT:  Sclerae anicteric, conjunctivae pink.  Oropharynx clear.  No mucositis or candidiasis.  No evidence of  frank distention over right jugular vein region. O2 sat 87% with exertion.  Nodes:  No cervical, supraclavicular, or axillary lymphadenopathy palpated.  Lungs:  Diminished throughout, no crackles, rhonchi, or wheezes.   Heart:  Tachycardic, normal rhythm.  Bilateral lower extremity edema.  Abdomen:  Obese, soft, nontender.  Positive bowel sounds.  No organomegaly or masses palpated.  No fluid wave of ascites.  Musculoskeletal:  No focal spinal tenderness to palpation.  Extremities:   1+ pitting edema  bilateral lower extremities. No cyanosis.  No upper extremity edema Skin:  Benign.   Neuro:  Nonfocal, alert and oriented x 3.  Lab Results: Lab Results  Component Value Date   WBC 9.6 01/11/2012   HGB 10.5* 01/11/2012   HCT 32.4* 01/11/2012   MCV 92.8 01/11/2012   PLT 273 01/11/2012   NEUTROABS 7.0* 01/11/2012   Assessment:  1. Abigail [name] is a 56 year old [name] with recurrent fallopian tube/ovarian carcinoma for which she completed 2 cycles of carboplatin/Taxol, cycle 3 held for one week due to complicated medical history including two hospitalizations following each cycle of chemotherapy. Completed Doxil given every two weeks. 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 History of BRCA2 abnormality with positive previous history of breast cancer..     Plan:  Patient is doing well. I did recommend that she continue Coumadin. She'll return on Jan 5th for the next dose of Doxil. CA125 have remained normal.   Abigail Mooty, md

## 2012-01-11 NOTE — Telephone Encounter (Signed)
Gave pt appt for 02/08/12. Ok to dbl bk per md verbal.

## 2012-01-11 NOTE — Patient Instructions (Addendum)
Please increase your Toprol to 100 mg a day Continue all other medications as listed  Follow up as needed.

## 2012-01-12 LAB — CA 125: CA 125: 24.5 U/mL (ref 0.0–30.2)

## 2012-01-29 ENCOUNTER — Telehealth: Payer: Self-pay | Admitting: *Deleted

## 2012-01-29 NOTE — Telephone Encounter (Signed)
Called pt and informed her that Dr. Donnie Coffin will no longer be here as of 02/06/12 and answered all questions at this time.  Confirmed 02/07/12 appt w/ pt.

## 2012-01-29 NOTE — Telephone Encounter (Signed)
Per Misty Stanley from breast center I have moved apapt to 1/2 from 1/3. Appt moved due to MD reassignment.  JMW

## 2012-02-04 ENCOUNTER — Other Ambulatory Visit: Payer: Self-pay | Admitting: *Deleted

## 2012-02-04 DIAGNOSIS — C5700 Malignant neoplasm of unspecified fallopian tube: Secondary | ICD-10-CM

## 2012-02-07 ENCOUNTER — Ambulatory Visit (HOSPITAL_BASED_OUTPATIENT_CLINIC_OR_DEPARTMENT_OTHER): Payer: Medicare Other | Admitting: Oncology

## 2012-02-07 ENCOUNTER — Other Ambulatory Visit: Payer: Self-pay | Admitting: Medical Oncology

## 2012-02-07 ENCOUNTER — Other Ambulatory Visit: Payer: Self-pay | Admitting: Oncology

## 2012-02-07 ENCOUNTER — Telehealth: Payer: Self-pay | Admitting: *Deleted

## 2012-02-07 ENCOUNTER — Ambulatory Visit (HOSPITAL_BASED_OUTPATIENT_CLINIC_OR_DEPARTMENT_OTHER): Payer: Medicare Other

## 2012-02-07 ENCOUNTER — Other Ambulatory Visit (HOSPITAL_BASED_OUTPATIENT_CLINIC_OR_DEPARTMENT_OTHER): Payer: Medicare Other | Admitting: Lab

## 2012-02-07 VITALS — BP 156/94 | HR 116 | Temp 99.4°F | Resp 20 | Ht 61.0 in | Wt 229.9 lb

## 2012-02-07 DIAGNOSIS — Z853 Personal history of malignant neoplasm of breast: Secondary | ICD-10-CM

## 2012-02-07 DIAGNOSIS — C569 Malignant neoplasm of unspecified ovary: Secondary | ICD-10-CM

## 2012-02-07 DIAGNOSIS — C50919 Malignant neoplasm of unspecified site of unspecified female breast: Secondary | ICD-10-CM

## 2012-02-07 DIAGNOSIS — N7091 Salpingitis, unspecified: Secondary | ICD-10-CM

## 2012-02-07 DIAGNOSIS — D689 Coagulation defect, unspecified: Secondary | ICD-10-CM

## 2012-02-07 DIAGNOSIS — C5700 Malignant neoplasm of unspecified fallopian tube: Secondary | ICD-10-CM

## 2012-02-07 DIAGNOSIS — Z7901 Long term (current) use of anticoagulants: Secondary | ICD-10-CM

## 2012-02-07 DIAGNOSIS — I82C19 Acute embolism and thrombosis of unspecified internal jugular vein: Secondary | ICD-10-CM

## 2012-02-07 DIAGNOSIS — O2230 Deep phlebothrombosis in pregnancy, unspecified trimester: Secondary | ICD-10-CM

## 2012-02-07 DIAGNOSIS — Z5111 Encounter for antineoplastic chemotherapy: Secondary | ICD-10-CM

## 2012-02-07 LAB — PROTIME-INR
INR: 1.8 — ABNORMAL LOW (ref 2.00–3.50)
Protime: 21.6 Seconds — ABNORMAL HIGH (ref 10.6–13.4)

## 2012-02-07 LAB — CBC WITH DIFFERENTIAL/PLATELET
BASO%: 0.3 % (ref 0.0–2.0)
Basophils Absolute: 0 10*3/uL (ref 0.0–0.1)
HCT: 31.9 % — ABNORMAL LOW (ref 34.8–46.6)
HGB: 10.2 g/dL — ABNORMAL LOW (ref 11.6–15.9)
LYMPH%: 13.2 % — ABNORMAL LOW (ref 14.0–49.7)
MCHC: 32 g/dL (ref 31.5–36.0)
MONO#: 0.9 10*3/uL (ref 0.1–0.9)
NEUT%: 72.6 % (ref 38.4–76.8)
Platelets: 283 10*3/uL (ref 145–400)
WBC: 7.5 10*3/uL (ref 3.9–10.3)
lymph#: 1 10*3/uL (ref 0.9–3.3)

## 2012-02-07 MED ORDER — SODIUM CHLORIDE 0.9 % IV SOLN
Freq: Once | INTRAVENOUS | Status: AC
  Administered 2012-02-07: 14:00:00 via INTRAVENOUS

## 2012-02-07 MED ORDER — SODIUM CHLORIDE 0.9 % IJ SOLN
10.0000 mL | INTRAMUSCULAR | Status: DC | PRN
  Administered 2012-02-07: 10 mL
  Filled 2012-02-07: qty 10

## 2012-02-07 MED ORDER — ONDANSETRON 8 MG/50ML IVPB (CHCC)
8.0000 mg | Freq: Once | INTRAVENOUS | Status: AC
  Administered 2012-02-07: 8 mg via INTRAVENOUS

## 2012-02-07 MED ORDER — HEPARIN SOD (PORK) LOCK FLUSH 100 UNIT/ML IV SOLN
500.0000 [IU] | Freq: Once | INTRAVENOUS | Status: AC | PRN
  Administered 2012-02-07: 500 [IU]
  Filled 2012-02-07: qty 5

## 2012-02-07 MED ORDER — DEXAMETHASONE SODIUM PHOSPHATE 10 MG/ML IJ SOLN
10.0000 mg | Freq: Once | INTRAMUSCULAR | Status: AC
  Administered 2012-02-07: 10 mg via INTRAVENOUS

## 2012-02-07 MED ORDER — DOXORUBICIN HCL LIPOSOMAL CHEMO INJECTION 2 MG/ML
60.0000 mg | Freq: Once | INTRAVENOUS | Status: AC
  Administered 2012-02-07: 60 mg via INTRAVENOUS
  Filled 2012-02-07: qty 30

## 2012-02-07 NOTE — Telephone Encounter (Signed)
to see me in 3 1/2 or 4 weeks; scans 2 days before visit; labs same day as scans; echo next 2 weeks (hochrein) Made patient appointment for labs and echo

## 2012-02-07 NOTE — Patient Instructions (Signed)
[facility] Discharge Instructions for Patients Receiving Chemotherapy  Today you received the following chemotherapy agents Doxil To help prevent nausea and vomiting after your treatment, we encourage you to take your nausea medication   Take it as often as prescribed.   If you develop nausea and vomiting that is not controlled by your nausea medication, call the clinic. If it is after clinic hours your family physician or the after hours number for the clinic or go to the Emergency Department.   BELOW ARE SYMPTOMS THAT SHOULD BE REPORTED IMMEDIATELY:  *FEVER GREATER THAN 100.5 F  *CHILLS WITH OR WITHOUT FEVER  NAUSEA AND VOMITING THAT IS NOT CONTROLLED WITH YOUR NAUSEA MEDICATION  *UNUSUAL SHORTNESS OF BREATH  *UNUSUAL BRUISING OR BLEEDING  TENDERNESS IN MOUTH AND THROAT WITH OR WITHOUT PRESENCE OF ULCERS  *URINARY PROBLEMS  *BOWEL PROBLEMS  UNUSUAL RASH Items with * indicate a potential emergency and should be followed up as soon as possible.  If this is your first treatment one of the nurses will contact you 24 hours after your treatment. Please let the nurse know about any problems that you may have experienced. Feel free to call the clinic you have any questions or concerns. The clinic phone number is [phone].   I have been informed and understand all the instructions given to me. I know to contact the clinic, my physician, or go to the Emergency Department if any problems should occur. I do not have any questions at this time, but understand that I may call the clinic during office hours   should I have any questions or need assistance in obtaining follow up care.    __________________________________________  _____________  __________ Signature of Patient or Authorized Representative            Date                   Time    __________________________________________ Nurse's Signature

## 2012-02-07 NOTE — Progress Notes (Signed)
ID: Abigail [name]   DOB: 28-Dec-1955  MR#: [id number]  WNU#:[id number]  PCP: Abigail Herter, MD GYN: Cleda Mccreedy SU: Harriette Bouillon OTHER MD: Rollene Rotunda, [place], JN Vanessa Ralphs   HISTORY OF PRESENT ILLNESS: The patient noticed a sharp pain in her left breast associated with a palpable mass.  The patient had a diagnostic mammogram and ultrasound of the left breast in 04/24/2007 that revealed a 2.1 cm tumor at the 10 o'clock position of the left breast.  She proceeded to undergo a biopsy of this mass, which was positive for invasive ductal carcinoma.  Both of these specimens from that date demonstrated this on 04/30/2007.  Abigail [name] therefore proceeded to undergo a lumpectomy and sentinel lymph node biopsy on 05/28/2007.  This revealed a tumor corresponding to invasive ductal carcinoma measuring 2.0 cm in maximum dimension.  The margins were negative and the tumor was grade 3.  A single sentinel lymph node was examined, which did not reveal any carcinoma.  Receptor studies indicate that the tumor was ER negative, PR negative and HER-2/neu negative.  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  She was therefore subsequently seen by Dr. Donnie Coffin for consideration of chemotherapy.  The patient's work up then subsequently included a CT scan of the chest, abdomen and pelvis on 07/02/2007.  Some postoperative changes were noted within the left breast and left axilla with a small retrocrural lymph node measuring up to 8 mm within the chest.  Notably omental implants were present, which appear to be consistent with possible peritoneal tumor and retroperitoneal lymphadenopathy was also seen with the lymph nodes measuring up to 1.5 cm.  A 3.1 x 2.4 cm mass was seen along the posterior aspect of the descending colon.  The patient has a strong family history of breast and ovarian cancer with reportedly a history of BRCA positivity within  the family.  Therefore, simultaneous separate primaries was considered and the patient was seen by Dr. Duard Brady for evaluation of possible gynecologic malignancy as well.  The patient therefore underwent a total abdominal hysterectomy, BSO, omentectomy with optimal tumor debulking on 08/12/2007.  This specimen revealed metastatic high-grade carcinoma and this specimen was also seen in addition to the breast tumor for outside consult to [facility].  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  Additionally, the invasive ductal carcinoma was confirmed from the breast surgery.  Therefore, ultimately the patient has been diagnosed with invasive ductal carcinoma of the left breast in addition to stage IIIC fallopian tube carcinoma. Her subsequent history is as detailed below  INTERVAL HISTORY: Abigail [name] returns today for followup of her fallopian tube carcinoma. The interval history is generally stable. Today is day 1 of her fourth cycle of monthly liposomal doxorubicin   REVIEW OF SYSTEMS: She tolerates treatment well. She does not like the hyperpigmentation she notices in her hands and fingernails. She feels a little bit more tired, she tells me, but not more short of breath. She has "a cold" right now but again no fever, no significant phlegm production, no pleurisy, no hemoptysis. She tells me her sense of taste is "gone". There have been no bleeding complications from her anti-coagulation. Overall low she feels that she is tolerating the chemotherapy well and that things are stable. A detailed review of systems was otherwise noncontributory.  PAST MEDICAL HISTORY: Past Medical History  Diagnosis Date  .  Hypertension   . Obesity   . Diverticulitis   . Hemorrhoids   . Colonic polyp   . GERD (gastroesophageal reflux disease)   . Cancer     BREAST, FALLOPIAN TUBE,  . Blood clot of neck vein     PAST SURGICAL  HISTORY: Past Surgical History  Procedure Date  . Tonsillectomy and adenoidectomy   . Breast lumpectomy   . Abdominal hysterectomy     TAH, BSO  . Port a cath     05/29/11    FAMILY HISTORY Family History  Problem Relation Age of Onset  . Cancer Mother   . Hypertension Mother   . Diabetes Father   . Heart disease Father 88  . Hypertension Father   . Stroke Father   . Hypertension Sister   . Hypertension Brother   . Hypertension Paternal Aunt   . Diabetes Paternal Aunt   . Hypertension Paternal Uncle   . Diabetes Paternal Uncle   . Hypertension Paternal Grandmother   . Hypertension Paternal Grandfather   Father died of complications of cardiac disease, mother died of complications of cancer at age 69.  She had both breast and ovarian cancer diagnosed at age 75.  The patient has one brother who had been stabbed, other sister who died of complications of obesity.  She has a total of four living brothers and has another sister who is living.  She has a second cousin on her mother's side who has also had breast cancer. The patient is known to carry a BRCA-1 mutation C64Y [310G>A) . Her other 2 children and the rest of her family have not been tested, although they know that that has been recommended. "I have told them, they just don't want to do it".  GYNECOLOGIC HISTORY: Gravida 3, para 3, daughter Gerlean Cid is a patient of ours who has the same BRCA 1/2 abnormality and is status post bilateral oophorectomies and mastectomies. The patient has two sons who are alive and well and are in good health.  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 She is divorced and lives by herself.   ADVANCED DIRECTIVES:  HEALTH MAINTENANCE: History  Substance Use Topics  . Smoking status: Former Smoker -- 0.5 packs/day for 7 years    Types: Cigarettes    Quit date: 08/07/2006  . Smokeless tobacco: Never Used  . Alcohol Use: No     Colonoscopy:  PAP:  Bone  density:  Lipid panel:  Allergies  Allergen Reactions  . Lisinopril Cough  . Codeine Itching  . Oxycodone Nausea Only  . Vicodin (Hydrocodone-Acetaminophen) Nausea Only    Current Outpatient Prescriptions  Medication Sig Dispense Refill  . amLODipine (NORVASC) 5 MG tablet Take 1 tablet (5 mg total) by mouth daily.  30 tablet  11  . benzonatate (TESSALON) 200 MG capsule Take 1 capsule (200 mg total) by mouth 3 (three) times daily as needed for cough.  20 capsule  0  . Cholecalciferol (VITAMIN D3) 3000 UNITS TABS Take 3,000 Units by mouth daily.       Marland Kitchen dexamethasone (DECADRON) 4 MG tablet Take 2 tablets by mouth daily starting the day after chemotherapy for 2 days. Take with food.  30 tablet  1  . KLOR-CON M20 20 MEQ tablet TAKE 1 TABLET BY MOUTH EVERY DAY  30 tablet  1  . lidocaine-prilocaine (EMLA) cream Apply topically as needed. For PAC  30 g  1  .  metoprolol succinate (TOPROL XL) 100 MG 24 hr tablet Take 1 tablet (100 mg total) by mouth daily. Take with or immediately following a meal.  30 tablet  11  . ondansetron (ZOFRAN) 8 MG tablet Take 1 tablet two times a day starting the day after chemo for 2 days. Then take 1 tablet two times a day as needed for nausea or vomiting.  30 tablet  1  . prochlorperazine (COMPAZINE) 10 MG tablet Take 1 tablet (10 mg total) by mouth every 6 (six) hours as needed (Nausea or vomiting).  30 tablet  1  . prochlorperazine (COMPAZINE) 25 MG suppository Place 25 mg rectally every 12 (twelve) hours as needed.      . warfarin (COUMADIN) 5 MG tablet Take 7.5 mg by mouth daily. 1.5 tabs everyday       . warfarin (COUMADIN) 5 MG tablet TAKE 1 OR 1 & 1/2 TABLET BY MOUTH ONCE DAILY OR AS DIRECTED  60 tablet  3    OBJECTIVE: Middle-aged African American woman in no acute distress Filed Vitals:   02/07/12 1254  BP: 156/94  Pulse: 116  Temp: 99.4 F (37.4 C)  Resp: 20     Body mass index is 43.44 kg/(m^2).    ECOG FS: 2  Sclerae unicteric Oropharynx  clear No cervical or supraclavicular adenopathy Lungs no rales or rhonchi, distant breath sounds bilaterally Heart regular rate and rhythm Abd obese, nontender, positive bowel sounds, no masses palpated MSK no focal spinal tenderness Neuro: nonfocal Breasts: The right breast is unremarkable. The left breast is status post lumpectomy and radiation. There is no evidence of local recurrence. The left axilla is benign.   LAB RESULTS: Lab Results  Component Value Date   WBC 9.6 01/11/2012   NEUTROABS 7.0* 01/11/2012   HGB 10.5* 01/11/2012   HCT 32.4* 01/11/2012   MCV 92.8 01/11/2012   PLT 273 01/11/2012      Chemistry      Component Value Date/Time   NA 140 01/11/2012 1029   NA 138 09/06/2011 0827   K 3.3* 01/11/2012 1029   K 3.8 09/06/2011 0827   CL 102 01/11/2012 1029   CL 100 09/06/2011 0827   CO2 27 01/11/2012 1029   CO2 29 09/06/2011 0827   BUN 7.0 01/11/2012 1029   BUN 14 09/06/2011 0827   CREATININE 0.8 01/11/2012 1029   CREATININE 0.76 09/06/2011 0827      Component Value Date/Time   CALCIUM 9.6 01/11/2012 1029   CALCIUM 8.7 09/06/2011 0827   ALKPHOS 126 01/11/2012 1029   ALKPHOS 94 09/06/2011 0827   AST 16 01/11/2012 1029   AST 18 09/06/2011 0827   ALT 10 01/11/2012 1029   ALT 26 09/06/2011 0827   BILITOT 0.36 01/11/2012 1029   BILITOT 0.4 09/06/2011 0827       Lab Results  Component Value Date   LABCA2 63* 05/04/2011   Results for NICKAYLA, MCINNIS (MRN [id number]) as of 02/07/2012 08:41  Ref. Range 10/10/2010 08:27 01/24/2011 11:29 05/14/2011 07:38 06/26/2011 09:20 07/30/2011 08:38 08/17/2011 09:19 08/30/2011 09:22 09/06/2011 08:27 10/23/2011 11:01 12/17/2011 10:18 01/11/2012 10:29  CA 125 Latest Range: 0.0-30.2 U/mL 11.5 52.6 (H) 123.4 (H) 109.0 (H) 24.9 15.4 10.6 8.6 14.6 19.3 24.5    No components found with this basename: LABCA125     Lab 02/07/12 1226  INR 1.80*    Urinalysis    Component Value Date/Time   COLORURINE YELLOW 07/09/2011 0015   APPEARANCEUR CLEAR 07/09/2011 0015   LABSPEC  1.017 07/09/2011 0015   PHURINE 5.5 07/09/2011 0015   GLUCOSEU NEGATIVE 07/09/2011 0015   HGBUR NEGATIVE 07/09/2011 0015   BILIRUBINUR NEGATIVE 07/09/2011 0015   KETONESUR NEGATIVE 07/09/2011 0015   PROTEINUR NEGATIVE 07/09/2011 0015   UROBILINOGEN 0.2 07/09/2011 0015   NITRITE NEGATIVE 07/09/2011 0015   LEUKOCYTESUR NEGATIVE 07/09/2011 0015    STUDIES: No results found.  ASSESSMENT: 58 y.o. BRCA-1 positive [place] woman with a history of breast and fallopian tube carcinomas  (1) s/p left lumpctomy and sentinel lymph node sampling April 2009 for a pT1c pN0, stage IA invasive ductal carcinoma, grade 3, triple negative, with an Mib-1 of 97%  (2) s/p TAH-BSO with omentectomy July 2009 for a stage IIIC fallopian tube carcinoma, optimally debulked  (3) received carboplatin (AUC 5) and paclitaxel (175 mg/M2) x6 completed November 2009  (4) completed radiation to the left breast February 2010  (5) biopsy-proven recurrent fallopian tube carcinoma April 2013, strongly estrogen receptor positive, involving mesentery, liver, and multiple nodal areas; treatment of recurrent disease has consisted of  (6) carboplatin/ pacliaxel x 2 cycles completed May 2013, with very poor tolerance  (7) liposomal doxorubicin/Doxil every 14 days x6 completed September 2013  (8) monthly Doxil started October 2013; most recent echo 08/13/2011  (9) right jugular DVT diagnosed June 2013, currently on 10 mg of Coumadin Tuesdays and Thursdays, 7.5 mg the other days of the week.  PLAN: She had a good initial response to the carboplatin and paclitaxel she received in April and May of 2013, but simply could not tolerate that intensive a treatment. She has now had a half a year of therapy on Doxil, and it would be a good idea to restage. Since her ovarian or fallopian 2 cancer was strongly estrogen receptor positive one option would be to of treat her with anti-estrogens. We could either consider tamoxifen under cover of warfarin, or an  aromatase inhibitor. I have discussed all this with Dr. Duard Brady. I will make sure she reviews the restaging studies next month, but as the need for surgery at this point is very unlikely, we have not scheduled an appointment for the patient with Gyn-Onc.  We are proceeding with treatment today. Abigail [name] will see me again in a month. We will review her staging studies at that time, as well as her repeat echocardiogram. She knows to call for any problems that may develop before that visit.     MAGRINAT,GUSTAV C    02/07/2012

## 2012-02-08 ENCOUNTER — Ambulatory Visit: Payer: Medicare Other | Admitting: Oncology

## 2012-02-08 ENCOUNTER — Ambulatory Visit: Payer: Medicare Other

## 2012-02-08 ENCOUNTER — Other Ambulatory Visit: Payer: Medicare Other | Admitting: Lab

## 2012-02-09 ENCOUNTER — Encounter: Payer: Self-pay | Admitting: Oncology

## 2012-02-11 ENCOUNTER — Ambulatory Visit (INDEPENDENT_AMBULATORY_CARE_PROVIDER_SITE_OTHER): Payer: Medicare Other | Admitting: Family Medicine

## 2012-02-11 ENCOUNTER — Encounter: Payer: Self-pay | Admitting: Family Medicine

## 2012-02-11 DIAGNOSIS — J209 Acute bronchitis, unspecified: Secondary | ICD-10-CM

## 2012-02-11 MED ORDER — AMOXICILLIN-POT CLAVULANATE 875-125 MG PO TABS: 1.0000 | ORAL_TABLET | Freq: Two times a day (BID) | ORAL | Status: DC

## 2012-02-11 NOTE — Progress Notes (Signed)
  Subjective:    Patient ID: Abigail [name], female    DOB: [birth year], 57 y.o.   MRN: [id number]  HPI She is here for reevaluation of intermittent productive cough of whitish green material since late October with occasional chills but no fever. Some sore throat and occasional headache with rhinorrhea but no nasal congestion or earache. She did have chemotherapy 4 days ago. She continues on medications listed in the chart. She is scheduled for followup visit with oncology in approximately 2 weeks.   Review of Systems     Objective:   Physical Exam alert and in no distress. Tympanic membranes and canals are normal. Throat is clear. Tonsils are normal. Neck is supple without adenopathy or thyromegaly. Cardiac exam shows a regular sinus rhythm without murmurs or gallops. Lungs show faint bilateral rales       Assessment & Plan:   1. Acute bronchitis  amoxicillin-clavulanate (AUGMENTIN) 875-125 MG per tablet   she will stay on the antibiotic and might possibly need an x-ray if no improvement in 2 weeks.

## 2012-02-11 NOTE — Patient Instructions (Signed)
Take all the antibiotic. We might need an x-ray at the end of the 10 days.

## 2012-02-11 NOTE — Progress Notes (Signed)
Called med in 

## 2012-02-13 ENCOUNTER — Encounter: Payer: Self-pay | Admitting: *Deleted

## 2012-02-13 NOTE — Progress Notes (Unsigned)
Called to verify patient's dose of Coumadin. Patient states she takes 10mg  Tues, Thurs. And 7.5mg  M,W,F,Sat, Sun.

## 2012-02-19 ENCOUNTER — Encounter: Payer: Self-pay | Admitting: Family Medicine

## 2012-02-19 ENCOUNTER — Emergency Department (HOSPITAL_COMMUNITY): Payer: Medicare Other

## 2012-02-19 ENCOUNTER — Encounter (HOSPITAL_COMMUNITY): Payer: Self-pay | Admitting: *Deleted

## 2012-02-19 ENCOUNTER — Ambulatory Visit (INDEPENDENT_AMBULATORY_CARE_PROVIDER_SITE_OTHER): Payer: Medicare Other | Admitting: Family Medicine

## 2012-02-19 ENCOUNTER — Inpatient Hospital Stay (HOSPITAL_COMMUNITY)
Admission: EM | Admit: 2012-02-19 | Discharge: 2012-03-05 | DRG: 189 | Disposition: A | Discharge status: Home Health | Payer: Medicare Other | Attending: Internal Medicine | Admitting: Internal Medicine

## 2012-02-19 VITALS — BP 124/74 | HR 130 | Resp 24 | Wt 232.0 lb

## 2012-02-19 DIAGNOSIS — D630 Anemia in neoplastic disease: Secondary | ICD-10-CM | POA: Diagnosis present

## 2012-02-19 DIAGNOSIS — R0902 Hypoxemia: Secondary | ICD-10-CM | POA: Diagnosis present

## 2012-02-19 DIAGNOSIS — R0602 Shortness of breath: Secondary | ICD-10-CM

## 2012-02-19 DIAGNOSIS — C5700 Malignant neoplasm of unspecified fallopian tube: Secondary | ICD-10-CM

## 2012-02-19 DIAGNOSIS — I10 Essential (primary) hypertension: Secondary | ICD-10-CM

## 2012-02-19 DIAGNOSIS — R0989 Other specified symptoms and signs involving the circulatory and respiratory systems: Secondary | ICD-10-CM

## 2012-02-19 DIAGNOSIS — J189 Pneumonia, unspecified organism: Secondary | ICD-10-CM | POA: Diagnosis present

## 2012-02-19 DIAGNOSIS — J8410 Pulmonary fibrosis, unspecified: Secondary | ICD-10-CM | POA: Diagnosis present

## 2012-02-19 DIAGNOSIS — D638 Anemia in other chronic diseases classified elsewhere: Secondary | ICD-10-CM | POA: Diagnosis present

## 2012-02-19 DIAGNOSIS — Z6841 Body Mass Index (BMI) 40.0 and over, adult: Secondary | ICD-10-CM

## 2012-02-19 DIAGNOSIS — Z1501 Genetic susceptibility to malignant neoplasm of breast: Secondary | ICD-10-CM

## 2012-02-19 DIAGNOSIS — J849 Interstitial pulmonary disease, unspecified: Secondary | ICD-10-CM | POA: Diagnosis present

## 2012-02-19 DIAGNOSIS — E538 Deficiency of other specified B group vitamins: Secondary | ICD-10-CM | POA: Diagnosis present

## 2012-02-19 DIAGNOSIS — B999 Unspecified infectious disease: Secondary | ICD-10-CM | POA: Diagnosis present

## 2012-02-19 DIAGNOSIS — J9600 Acute respiratory failure, unspecified whether with hypoxia or hypercapnia: Principal | ICD-10-CM | POA: Diagnosis present

## 2012-02-19 DIAGNOSIS — B9789 Other viral agents as the cause of diseases classified elsewhere: Secondary | ICD-10-CM | POA: Diagnosis present

## 2012-02-19 DIAGNOSIS — R5381 Other malaise: Secondary | ICD-10-CM | POA: Diagnosis present

## 2012-02-19 DIAGNOSIS — R05 Cough: Secondary | ICD-10-CM

## 2012-02-19 DIAGNOSIS — Z7901 Long term (current) use of anticoagulants: Secondary | ICD-10-CM

## 2012-02-19 DIAGNOSIS — B348 Other viral infections of unspecified site: Secondary | ICD-10-CM | POA: Diagnosis present

## 2012-02-19 DIAGNOSIS — E871 Hypo-osmolality and hyponatremia: Secondary | ICD-10-CM | POA: Diagnosis present

## 2012-02-19 DIAGNOSIS — T45515A Adverse effect of anticoagulants, initial encounter: Secondary | ICD-10-CM | POA: Diagnosis present

## 2012-02-19 DIAGNOSIS — A419 Sepsis, unspecified organism: Secondary | ICD-10-CM

## 2012-02-19 DIAGNOSIS — C801 Malignant (primary) neoplasm, unspecified: Secondary | ICD-10-CM

## 2012-02-19 DIAGNOSIS — J9601 Acute respiratory failure with hypoxia: Secondary | ICD-10-CM | POA: Diagnosis present

## 2012-02-19 DIAGNOSIS — J9690 Respiratory failure, unspecified, unspecified whether with hypoxia or hypercapnia: Secondary | ICD-10-CM

## 2012-02-19 DIAGNOSIS — E669 Obesity, unspecified: Secondary | ICD-10-CM | POA: Diagnosis present

## 2012-02-19 DIAGNOSIS — I82B29 Chronic embolism and thrombosis of unspecified subclavian vein: Secondary | ICD-10-CM | POA: Diagnosis present

## 2012-02-19 DIAGNOSIS — Z853 Personal history of malignant neoplasm of breast: Secondary | ICD-10-CM

## 2012-02-19 DIAGNOSIS — I5033 Acute on chronic diastolic (congestive) heart failure: Secondary | ICD-10-CM | POA: Diagnosis present

## 2012-02-19 DIAGNOSIS — R0600 Dyspnea, unspecified: Secondary | ICD-10-CM

## 2012-02-19 DIAGNOSIS — D709 Neutropenia, unspecified: Secondary | ICD-10-CM

## 2012-02-19 DIAGNOSIS — I82409 Acute embolism and thrombosis of unspecified deep veins of unspecified lower extremity: Secondary | ICD-10-CM | POA: Diagnosis present

## 2012-02-19 DIAGNOSIS — D649 Anemia, unspecified: Secondary | ICD-10-CM

## 2012-02-19 DIAGNOSIS — D696 Thrombocytopenia, unspecified: Secondary | ICD-10-CM

## 2012-02-19 DIAGNOSIS — R000 Tachycardia, unspecified: Secondary | ICD-10-CM | POA: Diagnosis present

## 2012-02-19 DIAGNOSIS — R791 Abnormal coagulation profile: Secondary | ICD-10-CM | POA: Diagnosis present

## 2012-02-19 DIAGNOSIS — I498 Other specified cardiac arrhythmias: Secondary | ICD-10-CM | POA: Diagnosis present

## 2012-02-19 DIAGNOSIS — E876 Hypokalemia: Secondary | ICD-10-CM | POA: Diagnosis present

## 2012-02-19 DIAGNOSIS — I509 Heart failure, unspecified: Secondary | ICD-10-CM | POA: Diagnosis present

## 2012-02-19 LAB — CBC WITH DIFFERENTIAL/PLATELET
Basophils Absolute: 0 10*3/uL (ref 0.0–0.1)
Eosinophils Absolute: 0.1 10*3/uL (ref 0.0–0.7)
Eosinophils Relative: 2 % (ref 0–5)
Lymphocytes Relative: 10 % — ABNORMAL LOW (ref 12–46)
MCV: 91.9 fL (ref 78.0–100.0)
Neutrophils Relative %: 74 % (ref 43–77)
Platelets: 207 10*3/uL (ref 150–400)
RDW: 17.1 % — ABNORMAL HIGH (ref 11.5–15.5)
WBC: 7.8 10*3/uL (ref 4.0–10.5)

## 2012-02-19 LAB — COMPREHENSIVE METABOLIC PANEL
Albumin: 3 g/dL — ABNORMAL LOW (ref 3.5–5.2)
Alkaline Phosphatase: 99 U/L (ref 39–117)
CO2: 27 mEq/L (ref 19–32)
Creatinine, Ser: 0.71 mg/dL (ref 0.50–1.10)
GFR calc Af Amer: >90 mL/min (ref >90)
GFR calc non Af Amer: >90 mL/min (ref >90)
Potassium: 4.1 mEq/L (ref 3.5–5.1)
Sodium: 131 mEq/L — ABNORMAL LOW (ref 135–145)
Total Bilirubin: 0.4 mg/dL (ref 0.3–1.2)

## 2012-02-19 LAB — PRO B NATRIURETIC PEPTIDE: Pro B Natriuretic peptide (BNP): 21.7 pg/mL (ref 0–125)

## 2012-02-19 LAB — TROPONIN I: Troponin I: <0.3 ng/mL (ref <0.30)

## 2012-02-19 MED ORDER — IOHEXOL 350 MG/ML SOLN
100.0000 mL | Freq: Once | INTRAVENOUS | Status: AC | PRN
  Administered 2012-02-19: 100 mL via INTRAVENOUS

## 2012-02-19 MED ORDER — LEVOFLOXACIN IN D5W 750 MG/150ML IV SOLN
750.0000 mg | Freq: Once | INTRAVENOUS | Status: AC
  Administered 2012-02-19: 750 mg via INTRAVENOUS
  Filled 2012-02-19: qty 150

## 2012-02-19 MED ORDER — METHYLPREDNISOLONE SODIUM SUCC 125 MG IJ SOLR
125.0000 mg | Freq: Once | INTRAMUSCULAR | Status: AC
  Administered 2012-02-19: 125 mg via INTRAVENOUS
  Filled 2012-02-19: qty 2

## 2012-02-19 MED ORDER — ALBUTEROL SULFATE (5 MG/ML) 0.5% IN NEBU
10.0000 mg | INHALATION_SOLUTION | Freq: Once | RESPIRATORY_TRACT | Status: AC
  Administered 2012-02-19: 10 mg via RESPIRATORY_TRACT

## 2012-02-19 MED ORDER — METOPROLOL SUCCINATE ER 100 MG PO TB24
100.0000 mg | ORAL_TABLET | Freq: Every day | ORAL | Status: DC
  Administered 2012-02-20 – 2012-02-24 (×5): 100 mg via ORAL
  Filled 2012-02-19 (×5): qty 1

## 2012-02-19 NOTE — ED Notes (Signed)
Patient transported to CT and returned 

## 2012-02-19 NOTE — Progress Notes (Signed)
  Subjective:    Patient ID: Abigail [name], female    DOB: [birth year], 57 y.o.   MRN: [id number]  HPI She is here for recheck. She states she is still coughing and now noticing left ear fullness as well as sore throat. No fever, chills, chest pain. She finished chemotherapy on January 2. She has been on Augmentin since her last visit to me.   Review of Systems     Objective:   Physical Exam alert and in no distress. Tympanic membranes and canals are normal. Throat is clear. Tonsils are normal. Neck is supple without adenopathy or thyromegaly. Cardiac exam shows a  sinus tachycardia without murmurs or gallops. Lungs show bilateral rales midway up the lung fields with tachypnea. She desaturated down to 90 with walking. View of her record indicates she is also scheduled for echocardiogram tomorrow.        Assessment & Plan:   1. Fallopian tube cancer, BRCA2 positive   2. Dyspnea   3. Cough    case was discussed with Dr. Darnelle Catalan. Patient will be sent to [facility] long for further evaluation.

## 2012-02-19 NOTE — ED Provider Notes (Addendum)
History     CSN: [id number]  Arrival date & time 02/19/12  1405   First MD Initiated Contact with Patient 02/19/12 1459      Chief Complaint  Patient presents with  . Shortness of Breath  . Cough  . Nausea    (Consider location/radiation/quality/duration/timing/severity/associated sxs/prior treatment) HPI Comments: Patient comes to the ER for evaluation of shortness of breath. Patient was sent to the ER from the primary care physician's office. Patient was found to have tachycardia and hypoxia when evaluated in the office. Patient reports that she is having problems with cough and chest congestion since December. She was treated a couple of times with antibiotics and improved, but symptoms returned. Patient reports for the last 2 weeks has been having progressively worsening cough, chest congestion and shortness of breath. Cough is nonproductive. She has had chills but no fever. Patient has completed chemotherapy for ovarian cancer on January 2.  Patient is a 57 y.o. female presenting with shortness of breath and cough.  Shortness of Breath  Associated symptoms include cough and shortness of breath. Pertinent negatives include no chest pain.  Cough Associated symptoms include chills and shortness of breath. Pertinent negatives include no chest pain.    Past Medical History  Diagnosis Date  . Hypertension   . Obesity   . Diverticulitis   . Hemorrhoids   . Colonic polyp   . GERD (gastroesophageal reflux disease)   . Cancer     BREAST, FALLOPIAN TUBE,  . Blood clot of neck vein     Past Surgical History  Procedure Date  . Tonsillectomy and adenoidectomy   . Breast lumpectomy   . Abdominal hysterectomy     TAH, BSO  . Port a cath     05/29/11    Family History  Problem Relation Age of Onset  . Cancer Mother   . Hypertension Mother   . Diabetes Father   . Heart disease Father 86  . Hypertension Father   . Stroke Father   . Hypertension Sister   . Hypertension  Brother   . Hypertension Paternal Aunt   . Diabetes Paternal Aunt   . Hypertension Paternal Uncle   . Diabetes Paternal Uncle   . Hypertension Paternal Grandmother   . Hypertension Paternal Grandfather     History  Substance Use Topics  . Smoking status: Former Smoker -- 0.5 packs/day for 7 years    Types: Cigarettes    Quit date: 08/07/2006  . Smokeless tobacco: Never Used  . Alcohol Use: No    OB History    Grav Para Term Preterm Abortions TAB SAB Ect Mult Living                  Review of Systems  Constitutional: Positive for chills.  Respiratory: Positive for cough and shortness of breath.   Cardiovascular: Negative for chest pain.  Gastrointestinal: Negative for abdominal distention.  Genitourinary: Negative.   All other systems reviewed and are negative.    Allergies  Lisinopril; Codeine; Oxycodone; and Vicodin  Home Medications   Current Outpatient Rx  Name  Route  Sig  Dispense  Refill  . AMLODIPINE BESYLATE 5 MG PO TABS   Oral   Take 1 tablet (5 mg total) by mouth daily.   30 tablet   11   . AMOXICILLIN-POT CLAVULANATE 875-125 MG PO TABS   Oral   Take 1 tablet by mouth 2 (two) times daily. Day 5 of therapy         .  BENZONATATE 200 MG PO CAPS   Oral   Take 1 capsule (200 mg total) by mouth 3 (three) times daily as needed for cough.   20 capsule   0   . VITAMIN D3 3000 UNITS PO TABS   Oral   Take 3,000 Units by mouth daily.          Marland Kitchen DEXAMETHASONE 4 MG PO TABS      Take 2 tablets by mouth daily starting the day after chemotherapy for 2 days. Take with food.   30 tablet   1   . KLOR-CON M20 20 MEQ PO TBCR      TAKE 1 TABLET BY MOUTH EVERY DAY   30 tablet   1   . LIDOCAINE-PRILOCAINE 2.5-2.5 % EX CREA   Topical   Apply topically as needed. For PAC   30 g   1   . METOPROLOL SUCCINATE ER 100 MG PO TB24   Oral   Take 1 tablet (100 mg total) by mouth daily. Take with or immediately following a meal.   30 tablet   11   .  ONDANSETRON HCL 8 MG PO TABS      Take 1 tablet two times a day starting the day after chemo for 2 days. Then take 1 tablet two times a day as needed for nausea or vomiting.   30 tablet   1   . PRESCRIPTION MEDICATION      Pt [facility]. Last treatment was on 02-09-12. Next treatment is not scheduled at this time. Followed by Dr. Darnelle Catalan         . PROCHLORPERAZINE MALEATE 10 MG PO TABS   Oral   Take 1 tablet (10 mg total) by mouth every 6 (six) hours as needed (Nausea or vomiting).   30 tablet   1   . PROCHLORPERAZINE 25 MG RE SUPP   Rectal   Place 25 mg rectally every 12 (twelve) hours as needed.         . WARFARIN SODIUM 5 MG PO TABS   Oral   Take 7.5 mg by mouth daily. 1.5 tabs everyday           BP 135/87  Pulse 118  Temp 100.2 F (37.9 C) (Oral)  Resp 21  SpO2 93%  Physical Exam  Constitutional: She is oriented to person, place, and time. She appears well-developed and well-nourished. No distress.  HENT:  Head: Normocephalic and atraumatic.  Right Ear: Hearing normal.  Nose: Nose normal.  Mouth/Throat: Oropharynx is clear and moist and mucous membranes are normal.  Eyes: Conjunctivae normal and EOM are normal. Pupils are equal, round, and reactive to light.  Neck: Normal range of motion. Neck supple.  Cardiovascular: Normal rate, regular rhythm, S1 normal and S2 normal.  Exam reveals no gallop and no friction rub.   No murmur heard. Pulmonary/Chest: Accessory muscle usage present. Tachypnea noted. No respiratory distress. She has decreased breath sounds in the right lower field and the left lower field. She has wheezes in the right lower field and the left lower field. She exhibits no tenderness.  Abdominal: Soft. Normal appearance and bowel sounds are normal. There is no hepatosplenomegaly. There is no tenderness. There is no rebound, no guarding, no tenderness at McBurney's point and negative Murphy's sign. No hernia.  Musculoskeletal: Normal range of  motion.  Neurological: She is alert and oriented to person, place, and time. She has normal strength. No cranial nerve deficit or sensory deficit.  Coordination normal. GCS eye subscore is 4. GCS verbal subscore is 5. GCS motor subscore is 6.  Skin: Skin is warm, dry and intact. No rash noted. No cyanosis.  Psychiatric: She has a normal mood and affect. Her speech is normal and behavior is normal. Thought content normal.    ED Course  Procedures (including critical care time)   Date: 02/19/2012  Rate: 127  Rhythm: sinus tachycardia  QRS Axis: left  Intervals: normal  ST/T Wave abnormalities: nonspecific ST/T changes  Conduction Disutrbances:left anterior fascicular block  Narrative Interpretation:   Old EKG Reviewed: none available    Labs Reviewed  CBC WITH DIFFERENTIAL - Abnormal; Notable for the following:    RBC 3.45 (*)     Hemoglobin 10.4 (*)     HCT 31.7 (*)     RDW 17.1 (*)     Lymphocytes Relative 10 (*)     Monocytes Relative 14 (*)     Monocytes Absolute 1.1 (*)     All other components within normal limits  COMPREHENSIVE METABOLIC PANEL - Abnormal; Notable for the following:    Sodium 131 (*)     Chloride 92 (*)     Albumin 3.0 (*)     All other components within normal limits  PROTIME-INR - Abnormal; Notable for the following:    Prothrombin Time 15.3 (*)     All other components within normal limits  PRO B NATRIURETIC PEPTIDE  TROPONIN I   Dg Chest 2 View  02/19/2012  *RADIOLOGY REPORT*  Clinical Data: Shortness of breath, cough.  CHEST - 2 VIEW  Comparison: 09/27/2011  Findings: Right Port-A-Cath remains in place, unchanged. Cardiomegaly.  Increasing interstitial prominence throughout the lungs, likely interstitial edema.  Increasing bibasilar densities, likely atelectasis.  No effusions.  No acute bony abnormality.  IMPRESSION: Increasing interstitial prominence, likely edema and bibasilar atelectasis.   Original Report Authenticated By: Charlett Nose, M.D.     Ct Angio Chest W/cm &/or Wo Cm  02/19/2012  *RADIOLOGY REPORT*  Clinical Data: Tachycardia, shortness of breath.  History breast carcinoma.  The  CT ANGIOGRAPHY CHEST  Technique:  Multidetector CT imaging of the chest using the standard protocol during bolus administration of intravenous contrast. Multiplanar reconstructed images including MIPs were obtained and reviewed to evaluate the vascular anatomy.  Contrast: OMNIPAQUE IOHEXOL 350 MG/ML SOLN  Comparison: 09/27/2011  Findings: There is good contrast opacification of the pulmonary artery branches.  No discrete filling defect to suggest acute PE.Patient breathing during the acquisition degrades multiple images. Adequate contrast opacification of the thoracic aorta with no evidence of dissection, aneurysm, or stenosis. There is classic 3-vessel brachiocephalic arch anatomy.  Right port catheter extends to distal SVC.  No pleural or pericardial effusion.  Stable sub centimeter anterior mediastinal and retrocrural lymph nodes.  No hilar adenopathy.  Probable central right subclavian vein short segment occlusion or stenosis with opacification of multiple body wall and mediastinal collateral venous channels.  Progressive fibrotic changes in both lung bases with bronchiectasis.  There are coarse fibrotic changes in both upper lobes with pleural-based areas of coarse consolidation, also progressive since the prior study.  Decreased lung volumes. Patchy somewhat geographic ground- glass opacities throughout the remainder the lung fields. Exuberant flowing osteophytes across multiple contiguous levels in the mid and lower thoracic spine.  IMPRESSION:  1.  Negative for acute PE or thoracic aortic dissection. 2.  Progressive pulmonary parenchymal disease as above.   Original Report Authenticated By: D. Andria Rhein, MD  Diagnosis: 1. Fibrotic Lung Disease 2. Respiratory Failure    MDM  Patient comes to the ER for evaluation of difficulty breathing.  She has had progressively worsening shortness of breath for at least 2 weeks. Initially it was thought she might have bronchitis, but she saw her doctor today and was acutely worse. She was tachycardic, tachypneic and hypoxic. She was referred to the ER for further evaluation. On arrival she was still mildly hypoxic and tachycardic. It was felt that the tachycardia was secondary to low-grade temperature and also her difficulty breathing. Initial workup did not show any obvious problems. It was felt that with her history, CT angiography of chest would be important to rule out PE as well as other abnormalities. No PE was seen but she has groundglass findings consistent with parenchymal lung disease. Would consider the possibility that this is secondary to recent chemotherapy. Patient still feeling very short of breath after Solu-Medrol and nebulizers. Will admit to the hospital for further workup.       Gilda Crease, MD 02/19/12 2305   Date: 02/19/2012  Rate: 120  Rhythm: sinus tachycardia   Intervals: normal  ST/T Wave abnormalities: nonspecific ST/T changes  Conduction Disutrbances:left anterior fascicular block     Gilda Crease, MD 02/19/12 2313

## 2012-02-19 NOTE — ED Notes (Signed)
Pt is at CT.

## 2012-02-19 NOTE — H&P (Addendum)
Abigail [name] is an 57 y.o. female.  Patient was seen and examined on February 19, 2012. PCP - Dr. Sharlot Gowda.  Chief Complaint: Shortness of breath. HPI: 57 year-old female with history of fallopian tube cancer on chemotherapy and had received recent chemotherapy on January 2 presents to the ER because of progressive shortness of breath. Patient has been having dyspnea for last 2-3 months and has been following with pulmonologist and cardiologist. Patient also has been recently placed on Augmentin by her primary care physician for the same reason. Patient states her shortness of breath had worsened and was instructed to come to the ER. Since patient is short of breath and tachycardic CT angiogram of the chest was done which was negative for pulmonary embolism but did show progression of fibrosis. Patient at this time has been admitted for further management. Patient denies any chest pain fever chills productive cough. Denies any nausea vomiting.  Past Medical History  Diagnosis Date  . Hypertension   . Obesity   . Diverticulitis   . Hemorrhoids   . Colonic polyp   . GERD (gastroesophageal reflux disease)   . Cancer     BREAST, FALLOPIAN TUBE,  . Blood clot of neck vein     Past Surgical History  Procedure Date  . Tonsillectomy and adenoidectomy   . Breast lumpectomy   . Abdominal hysterectomy     TAH, BSO  . Port a cath     05/29/11    Family History  Problem Relation Age of Onset  . Cancer Mother   . Hypertension Mother   . Diabetes Father   . Heart disease Father 2  . Hypertension Father   . Stroke Father   . Hypertension Sister   . Hypertension Brother   . Hypertension Paternal Aunt   . Diabetes Paternal Aunt   . Hypertension Paternal Uncle   . Diabetes Paternal Uncle   . Hypertension Paternal Grandmother   . Hypertension Paternal Grandfather    Social History:  reports that she quit smoking about 5 years ago. Her smoking use included Cigarettes. She has a 3.5  pack-year smoking history. She has never used smokeless tobacco. She reports that she does not drink alcohol or use illicit drugs.  Allergies:  Allergies  Allergen Reactions  . Lisinopril Cough  . Codeine Itching  . Oxycodone Nausea Only  . Vicodin (Hydrocodone-Acetaminophen) Nausea Only     (Not in a hospital admission)  Results for orders placed during the hospital encounter of 02/19/12 (from the past 48 hour(s))  CBC WITH DIFFERENTIAL     Status: Abnormal   Collection Time   02/19/12  3:45 PM      Component Value Range Comment   WBC 7.8  4.0 - 10.5 K/uL    RBC 3.45 (*) 3.87 - 5.11 MIL/uL    Hemoglobin 10.4 (*) 12.0 - 15.0 g/dL    HCT 16.1 (*) 09.6 - 46.0 %    MCV 91.9  78.0 - 100.0 fL    MCH 30.1  26.0 - 34.0 pg    MCHC 32.8  30.0 - 36.0 g/dL    RDW 04.5 (*) 40.9 - 15.5 %    Platelets 207  150 - 400 K/uL    Neutrophils Relative 74  43 - 77 %    Neutro Abs 5.8  1.7 - 7.7 K/uL    Lymphocytes Relative 10 (*) 12 - 46 %    Lymphs Abs 0.8  0.7 - 4.0 K/uL  Monocytes Relative 14 (*) 3 - 12 %    Monocytes Absolute 1.1 (*) 0.1 - 1.0 K/uL    Eosinophils Relative 2  0 - 5 %    Eosinophils Absolute 0.1  0.0 - 0.7 K/uL    Basophils Relative 0  0 - 1 %    Basophils Absolute 0.0  0.0 - 0.1 K/uL   COMPREHENSIVE METABOLIC PANEL     Status: Abnormal   Collection Time   02/19/12  3:45 PM      Component Value Range Comment   Sodium 131 (*) 135 - 145 mEq/L    Potassium 4.1  3.5 - 5.1 mEq/L MARKED HEMOLYSIS   Chloride 92 (*) 96 - 112 mEq/L    CO2 27  19 - 32 mEq/L    Glucose, Bld 85  70 - 99 mg/dL    BUN 12  6 - 23 mg/dL    Creatinine, Ser 4.09  0.50 - 1.10 mg/dL    Calcium 9.3  8.4 - 81.1 mg/dL    Total Protein 6.7  6.0 - 8.3 g/dL    Albumin 3.0 (*) 3.5 - 5.2 g/dL    AST 28  0 - 37 U/L    ALT 16  0 - 35 U/L    Alkaline Phosphatase 99  39 - 117 U/L    Total Bilirubin 0.4  0.3 - 1.2 mg/dL    GFR calc non Af Amer >90  >90 mL/min    GFR calc Af Amer >90  >90 mL/min   PRO B  NATRIURETIC PEPTIDE     Status: Normal   Collection Time   02/19/12  3:45 PM      Component Value Range Comment   Pro B Natriuretic peptide (BNP) 21.7  0 - 125 pg/mL   TROPONIN I     Status: Normal   Collection Time   02/19/12  3:45 PM      Component Value Range Comment   Troponin I <0.30  <0.30 ng/mL   PROTIME-INR     Status: Abnormal   Collection Time   02/19/12  3:45 PM      Component Value Range Comment   Prothrombin Time 15.3 (*) 11.6 - 15.2 seconds    INR 1.23  0.00 - 1.49    Dg Chest 2 View  02/19/2012  *RADIOLOGY REPORT*  Clinical Data: Shortness of breath, cough.  CHEST - 2 VIEW  Comparison: 09/27/2011  Findings: Right Port-A-Cath remains in place, unchanged. Cardiomegaly.  Increasing interstitial prominence throughout the lungs, likely interstitial edema.  Increasing bibasilar densities, likely atelectasis.  No effusions.  No acute bony abnormality.  IMPRESSION: Increasing interstitial prominence, likely edema and bibasilar atelectasis.   Original Report Authenticated By: Charlett Nose, M.D.    Ct Angio Chest W/cm &/or Wo Cm  02/19/2012  *RADIOLOGY REPORT*  Clinical Data: Tachycardia, shortness of breath.  History breast carcinoma.  The  CT ANGIOGRAPHY CHEST  Technique:  Multidetector CT imaging of the chest using the standard protocol during bolus administration of intravenous contrast. Multiplanar reconstructed images including MIPs were obtained and reviewed to evaluate the vascular anatomy.  Contrast: OMNIPAQUE IOHEXOL 350 MG/ML SOLN  Comparison: 09/27/2011  Findings: There is good contrast opacification of the pulmonary artery branches.  No discrete filling defect to suggest acute PE.Patient breathing during the acquisition degrades multiple images. Adequate contrast opacification of the thoracic aorta with no evidence of dissection, aneurysm, or stenosis. There is classic 3-vessel brachiocephalic arch anatomy.  Right port catheter extends  to distal SVC.  No pleural or  pericardial effusion.  Stable sub centimeter anterior mediastinal and retrocrural lymph nodes.  No hilar adenopathy.  Probable central right subclavian vein short segment occlusion or stenosis with opacification of multiple body wall and mediastinal collateral venous channels.  Progressive fibrotic changes in both lung bases with bronchiectasis.  There are coarse fibrotic changes in both upper lobes with pleural-based areas of coarse consolidation, also progressive since the prior study.  Decreased lung volumes. Patchy somewhat geographic ground- glass opacities throughout the remainder the lung fields. Exuberant flowing osteophytes across multiple contiguous levels in the mid and lower thoracic spine.  IMPRESSION:  1.  Negative for acute PE or thoracic aortic dissection. 2.  Progressive pulmonary parenchymal disease as above.   Original Report Authenticated By: D. Andria Rhein, MD     Review of Systems  Constitutional: Negative.   HENT: Negative.   Eyes: Negative.   Respiratory: Positive for shortness of breath.   Cardiovascular: Negative.   Gastrointestinal: Negative.   Genitourinary: Negative.   Musculoskeletal: Negative.   Skin: Negative.   Neurological: Negative.   Endo/Heme/Allergies: Negative.   Psychiatric/Behavioral: Negative.     Blood pressure 117/80, pulse 139, temperature 100.2 F (37.9 C), temperature source Oral, resp. rate 29, SpO2 100.00%. Physical Exam  Constitutional: She is oriented to person, place, and time. She appears well-developed and well-nourished. No distress.  HENT:  Head: Normocephalic and atraumatic.  Right Ear: External ear normal.  Left Ear: External ear normal.  Nose: Nose normal.  Mouth/Throat: Oropharynx is clear and moist. No oropharyngeal exudate.  Eyes: Conjunctivae normal are normal. Pupils are equal, round, and reactive to light. Right eye exhibits no discharge. Left eye exhibits no discharge. No scleral icterus.  Neck: Normal range of motion.  Neck supple.  Cardiovascular:       Sinus tachycardia.  Respiratory: Effort normal and breath sounds normal. No respiratory distress. She has no wheezes. She has no rales.  GI: Soft. Bowel sounds are normal. She exhibits no distension. There is no tenderness. There is no rebound.  Musculoskeletal: She exhibits no edema and no tenderness.  Neurological: She is alert and oriented to person, place, and time.       Moves all extremities.  Skin: Skin is warm and dry. She is not diaphoretic.     Assessment/Plan #1. Shortness of breath - cause is not clear. Patient is mildly febrile. Patient at this time is being empirically started on Levaquin for possibility of pneumonia. Check influenza panel. Closely observe in telemetry. 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 #2. Sinus tachycardia -patient has been worked up for this as outpatient with cardiologist and no specific cause has been found. 2-D echo done in July 2013 was showing EF of 60-65%. Patient has not taken her Toprol today and I have ordered one dose of which. Check thyroid function tests. Closely observe in telemetry. 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  CODE STATUS - full code.    Eduard Clos. 02/19/2012, 11:36 PM

## 2012-02-19 NOTE — ED Notes (Signed)
RT called

## 2012-02-19 NOTE — ED Notes (Signed)
md at bedside  Pt alert and oriented x4. Respirations even and unlabored, bilateral symmetrical rise and fall of chest. Skin warm and dry. In no acute distress. Denies needs.   

## 2012-02-19 NOTE — ED Notes (Signed)
Pt reports being sent from PCP due to low pulse ox with ambulation (90%) as well as increased HR, cough and shortness of breath with exertion. Pt endorses hx of ovarian cancer with completion of chemo (3rd round) on 02/07/12.

## 2012-02-20 ENCOUNTER — Ambulatory Visit (HOSPITAL_COMMUNITY): Payer: Medicare Other

## 2012-02-20 ENCOUNTER — Other Ambulatory Visit: Payer: Self-pay | Admitting: *Deleted

## 2012-02-20 ENCOUNTER — Encounter (HOSPITAL_COMMUNITY): Payer: Self-pay | Admitting: Oncology

## 2012-02-20 DIAGNOSIS — Z1501 Genetic susceptibility to malignant neoplasm of breast: Secondary | ICD-10-CM

## 2012-02-20 DIAGNOSIS — R0989 Other specified symptoms and signs involving the circulatory and respiratory systems: Secondary | ICD-10-CM

## 2012-02-20 DIAGNOSIS — C5700 Malignant neoplasm of unspecified fallopian tube: Secondary | ICD-10-CM

## 2012-02-20 DIAGNOSIS — R05 Cough: Secondary | ICD-10-CM

## 2012-02-20 LAB — CBC
HCT: 31.2 % — ABNORMAL LOW (ref 36.0–46.0)
MCH: 29.9 pg (ref 26.0–34.0)
MCV: 93.1 fL (ref 78.0–100.0)
Platelets: 189 10*3/uL (ref 150–400)
RBC: 3.35 MIL/uL — ABNORMAL LOW (ref 3.87–5.11)

## 2012-02-20 LAB — BASIC METABOLIC PANEL
CO2: 27 mEq/L (ref 19–32)
Calcium: 9.3 mg/dL (ref 8.4–10.5)
Chloride: 95 mEq/L — ABNORMAL LOW (ref 96–112)
Glucose, Bld: 221 mg/dL — ABNORMAL HIGH (ref 70–99)
Sodium: 132 mEq/L — ABNORMAL LOW (ref 135–145)

## 2012-02-20 LAB — INFLUENZA PANEL BY PCR (TYPE A & B)
H1N1 flu by pcr: NOT DETECTED
Influenza B By PCR: NEGATIVE

## 2012-02-20 MED ORDER — WARFARIN SODIUM 7.5 MG PO TABS
7.5000 mg | ORAL_TABLET | Freq: Once | ORAL | Status: AC
  Administered 2012-02-20: 7.5 mg via ORAL
  Filled 2012-02-20: qty 1

## 2012-02-20 MED ORDER — AMLODIPINE BESYLATE 5 MG PO TABS
5.0000 mg | ORAL_TABLET | Freq: Every day | ORAL | Status: DC
  Administered 2012-02-20 – 2012-03-05 (×15): 5 mg via ORAL
  Filled 2012-02-20 (×16): qty 1

## 2012-02-20 MED ORDER — WARFARIN - PHARMACIST DOSING INPATIENT: Freq: Every day | Status: DC

## 2012-02-20 MED ORDER — ONDANSETRON HCL 4 MG/2ML IJ SOLN
4.0000 mg | Freq: Four times a day (QID) | INTRAMUSCULAR | Status: DC | PRN
  Administered 2012-02-25: 4 mg via INTRAVENOUS
  Filled 2012-02-20: qty 2

## 2012-02-20 MED ORDER — SODIUM CHLORIDE 0.9 % IV SOLN: INTRAVENOUS | Status: AC

## 2012-02-20 MED ORDER — ACETAMINOPHEN 650 MG RE SUPP: 650.0000 mg | Freq: Four times a day (QID) | RECTAL | Status: DC | PRN

## 2012-02-20 MED ORDER — LEVOFLOXACIN IN D5W 750 MG/150ML IV SOLN
750.0000 mg | Freq: Every day | INTRAVENOUS | Status: DC
  Administered 2012-02-20 – 2012-02-21 (×2): 750 mg via INTRAVENOUS
  Filled 2012-02-20 (×2): qty 150

## 2012-02-20 MED ORDER — VITAMIN D 1000 UNITS PO TABS
3000.0000 [IU] | ORAL_TABLET | Freq: Every day | ORAL | Status: DC
  Administered 2012-02-20 – 2012-03-05 (×15): 3000 [IU] via ORAL
  Filled 2012-02-20 (×16): qty 3

## 2012-02-20 MED ORDER — ONDANSETRON HCL 4 MG PO TABS: 4.0000 mg | ORAL_TABLET | Freq: Four times a day (QID) | ORAL | Status: DC | PRN

## 2012-02-20 MED ORDER — POTASSIUM CHLORIDE CRYS ER 20 MEQ PO TBCR
20.0000 meq | EXTENDED_RELEASE_TABLET | Freq: Every day | ORAL | Status: DC
  Administered 2012-02-20 – 2012-03-02 (×11): 20 meq via ORAL
  Filled 2012-02-20 (×13): qty 1

## 2012-02-20 MED ORDER — BENZONATATE 100 MG PO CAPS
200.0000 mg | ORAL_CAPSULE | Freq: Three times a day (TID) | ORAL | Status: DC | PRN
  Administered 2012-02-22 – 2012-03-02 (×5): 200 mg via ORAL
  Filled 2012-02-20 (×5): qty 2

## 2012-02-20 MED ORDER — SODIUM CHLORIDE 0.9 % IJ SOLN
3.0000 mL | Freq: Two times a day (BID) | INTRAMUSCULAR | Status: DC
  Administered 2012-02-20 – 2012-02-23 (×5): 3 mL via INTRAVENOUS
  Administered 2012-02-28: 10 mL via INTRAVENOUS
  Administered 2012-02-28 – 2012-03-01 (×4): 3 mL via INTRAVENOUS
  Administered 2012-03-02: 10 mL via INTRAVENOUS
  Administered 2012-03-02 – 2012-03-05 (×2): 3 mL via INTRAVENOUS

## 2012-02-20 MED ORDER — PROCHLORPERAZINE 25 MG RE SUPP
25.0000 mg | Freq: Two times a day (BID) | RECTAL | Status: DC | PRN
  Filled 2012-02-20: qty 1

## 2012-02-20 MED ORDER — SODIUM CHLORIDE 0.9 % IV SOLN
INTRAVENOUS | Status: DC
  Administered 2012-02-20 (×2): via INTRAVENOUS

## 2012-02-20 MED ORDER — METOPROLOL SUCCINATE ER 100 MG PO TB24
100.0000 mg | ORAL_TABLET | ORAL | Status: AC
  Administered 2012-02-20: 100 mg via ORAL
  Filled 2012-02-20: qty 1

## 2012-02-20 MED ORDER — ACETAMINOPHEN 325 MG PO TABS
650.0000 mg | ORAL_TABLET | Freq: Four times a day (QID) | ORAL | Status: DC | PRN
  Administered 2012-02-21 – 2012-03-05 (×5): 650 mg via ORAL
  Filled 2012-02-20 (×6): qty 2

## 2012-02-20 NOTE — Progress Notes (Signed)
[facility] PROGRESS NOTE  Clotee Schlicker ZOX:[id number] DOB: 01-Sep-1955 DOA: 02/19/2012 PCP: Carollee Herter, MD  Assessment/Plan: 1. Shortness of breath - cause is not clear. Patient is mildly febrile. Patient at this time is being empirically started on Levaquin for possibility of pneumonia. Check influenza panel (pending). Closely observe in telemetry. 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 Is not on O2 at home BNP low  #2. Sinus tachycardia -patient has been worked up for this as outpatient with cardiologist and no specific cause has been found. 2-D echo done in July 2013 was showing EF of 60-65%. Patient has not taken her Toprol today and I have ordered one dose of which. Check thyroid function tests. Closely observe in telemetry.   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   Code Status: full Family Communication: patient at bedside Disposition Plan:    Consultants:    Procedures:    Antibiotics:  levaquin  HPI/Subjective: Feeling better this AM, less SOB  Objective: Filed Vitals:   02/19/12 2323 02/20/12 0034 02/20/12 0038 02/20/12 0625  BP:   144/97 124/83  Pulse:   123 88  Temp:   99 F (37.2 C) 98 F (36.7 C)  TempSrc:   Oral Oral  Resp: 29  20 20   Height:  5' (1.524 m)    Weight:  103.874 kg (229 lb)    SpO2:   100% 100%    Intake/Output Summary (Last 24 hours) at 02/20/12 0921 Last data filed at 02/20/12 0645  Gross per 24 hour  Intake 434.17 ml  Output      1 ml  Net 433.17 ml   Filed Weights   02/20/12 0034  Weight: 103.874 kg (229 lb)    Exam:   General:  A+Ox3, NAD  Cardiovascular: rrr  Respiratory: mild posterior wheezing, no increased work of breathing  Abdomen: +BS, obese  Port in right chest  wall  Data Reviewed: Basic Metabolic Panel:  Lab 02/20/12 8119 02/19/12 1545  NA 132* 131*  K 4.4 4.1  CL 95* 92*  CO2 27 27  GLUCOSE 221* 85  BUN 12 12  CREATININE 0.73 0.71  CALCIUM 9.3 9.3  MG -- --  PHOS -- --   Liver Function Tests:  Lab 02/19/12 1545  AST 28  ALT 16  ALKPHOS 99  BILITOT 0.4  PROT 6.7  ALBUMIN 3.0*   No results found for this basename: LIPASE:5,AMYLASE:5 in the last 168 hours No results found for this basename: AMMONIA:5 in the last 168 hours CBC:  Lab 02/20/12 0440 02/19/12 1545  WBC 6.7 7.8  NEUTROABS -- 5.8  HGB 10.0* 10.4*  HCT 31.2* 31.7*  MCV 93.1 91.9  PLT 189 207   Cardiac Enzymes:  Lab 02/19/12 1545  CKTOTAL --  CKMB --  CKMBINDEX --  TROPONINI <0.30   BNP (last 3 results)  Basename 02/19/12 1545 08/14/11 1522  PROBNP 21.7 18.0   CBG: No results found for this basename: GLUCAP:5 in the last 168 hours  No results found for this or any previous visit (from the past 240 hour(s)).   Studies: Dg Chest 2 View  02/19/2012  *RADIOLOGY REPORT*  Clinical Data: Shortness of breath, cough.  CHEST - 2 VIEW  Comparison: 09/27/2011  Findings: Right Port-A-Cath remains in place, unchanged. Cardiomegaly.  Increasing interstitial prominence throughout the lungs, likely interstitial edema.  Increasing bibasilar densities, likely atelectasis.  No effusions.  No acute bony abnormality.  IMPRESSION: Increasing interstitial prominence, likely edema and bibasilar atelectasis.   Original Report Authenticated By: Charlett Nose, M.D.    Ct Angio Chest W/cm &/or Wo Cm  02/19/2012  *RADIOLOGY REPORT*  Clinical Data: Tachycardia, shortness of breath.  History breast carcinoma.  The  CT ANGIOGRAPHY CHEST  Technique:  Multidetector CT imaging of the chest using the standard protocol during bolus administration of intravenous contrast. Multiplanar reconstructed images including MIPs were obtained and reviewed to evaluate the vascular anatomy.  Contrast:  OMNIPAQUE IOHEXOL 350 MG/ML SOLN  Comparison: 09/27/2011  Findings: There is good contrast opacification of the pulmonary artery branches.  No discrete filling defect to suggest acute PE.Patient breathing during the acquisition degrades multiple images. Adequate contrast opacification of the thoracic aorta with no evidence of dissection, aneurysm, or stenosis. There is classic 3-vessel brachiocephalic arch anatomy.  Right port catheter extends to distal SVC.  No pleural or pericardial effusion.  Stable sub centimeter anterior mediastinal and retrocrural lymph nodes.  No hilar adenopathy.  Probable central right subclavian vein short segment occlusion or stenosis with opacification of multiple body wall and mediastinal collateral venous channels.  Progressive fibrotic changes in both lung bases with bronchiectasis.  There are coarse fibrotic changes in both upper lobes with pleural-based areas of coarse consolidation, also progressive since the prior study.  Decreased lung volumes. Patchy somewhat geographic ground- glass opacities throughout the remainder the lung fields. Exuberant flowing osteophytes across multiple contiguous levels in the mid and lower thoracic spine.  IMPRESSION:  1.  Negative for acute PE or thoracic aortic dissection. 2.  Progressive pulmonary parenchymal disease as above.   Original Report Authenticated By: D. Andria Rhein, MD     Scheduled Meds:   . sodium chloride   Intravenous STAT  . amLODipine  5 mg Oral Daily  . cholecalciferol  3,000 Units Oral Daily  . levofloxacin (LEVAQUIN) IV  750 mg Intravenous QHS  . metoprolol succinate  100 mg Oral Daily  . potassium chloride SA  20 mEq Oral Daily  . sodium chloride  3 mL Intravenous Q12H  . warfarin  7.5 mg Oral ONCE-1800  . Warfarin - Pharmacist Dosing Inpatient   Does not apply q1800   Continuous Infusions:   . sodium chloride 50 mL/hr at 02/20/12 0104    Principal Problem:  *SOB (shortness of breath) Active Problems:   Fallopian tube cancer, BRCA2 positive  ILD (interstitial lung disease)  DVT (deep venous thrombosis)    Time spent: 35    [facility], Hasini Peachey  [facility] Pager [phone]. If 8PM-8AM, please contact night-coverage at [email or web address], password [facility] 02/20/2012, 9:21 AM  LOS: 1 day

## 2012-02-20 NOTE — Progress Notes (Signed)
ANTICOAGULATION CONSULT NOTE - Initial Consult  Pharmacy Consult for warfarin Indication: DVT (neck)  Allergies  Allergen Reactions  . Lisinopril Cough  . Codeine Itching  . Oxycodone Nausea Only  . Vicodin (Hydrocodone-Acetaminophen) Nausea Only    Patient Measurements: Height: 5' (152.4 cm) Weight: 229 lb (103.874 kg) IBW/kg (Calculated) : 45.5  Heparin Dosing Weight:   Vital Signs: Temp: 99 F (37.2 C) (01/15 0038) Temp src: Oral (01/15 0038) BP: 144/97 mmHg (01/15 0038) Pulse Rate: 123  (01/15 0038)  Labs:  Basename 02/19/12 1545  HGB 10.4*  HCT 31.7*  PLT 207  APTT --  LABPROT 15.3*  INR 1.23  HEPARINUNFRC --  CREATININE 0.71  CKTOTAL --  CKMB --  TROPONINI <0.30    Estimated Creatinine Clearance: 83.4 ml/min (by C-G formula based on Cr of 0.71).   Medical History: Past Medical History  Diagnosis Date  . Hypertension   . Obesity   . Diverticulitis   . Hemorrhoids   . Colonic polyp   . GERD (gastroesophageal reflux disease)   . Cancer     BREAST, FALLOPIAN TUBE,  . Blood clot of neck vein     Medications:  Prescriptions prior to admission  Medication Sig Dispense Refill  . amLODipine (NORVASC) 5 MG tablet Take 1 tablet (5 mg total) by mouth daily.  30 tablet  11  . amoxicillin-clavulanate (AUGMENTIN) 875-125 MG per tablet Take 1 tablet by mouth 2 (two) times daily. Day 5 of therapy      . benzonatate (TESSALON) 200 MG capsule Take 1 capsule (200 mg total) by mouth 3 (three) times daily as needed for cough.  20 capsule  0  . Cholecalciferol (VITAMIN D3) 3000 UNITS TABS Take 3,000 Units by mouth daily.       Marland Kitchen dexamethasone (DECADRON) 4 MG tablet Take 2 tablets by mouth daily starting the day after chemotherapy for 2 days. Take with food.  30 tablet  1  . KLOR-CON M20 20 MEQ tablet TAKE 1 TABLET BY MOUTH EVERY DAY  30 tablet  1  . lidocaine-prilocaine (EMLA) cream Apply topically as needed. For PAC  30 g  1  . metoprolol succinate (TOPROL XL) 100  MG 24 hr tablet Take 1 tablet (100 mg total) by mouth daily. Take with or immediately following a meal.  30 tablet  11  . ondansetron (ZOFRAN) 8 MG tablet Take 1 tablet two times a day starting the day after chemo for 2 days. Then take 1 tablet two times a day as needed for nausea or vomiting.  30 tablet  1  . PRESCRIPTION MEDICATION [facility]. Last treatment was on 02-09-12. Next treatment is not scheduled at this time. Followed by Dr. Darnelle Catalan      . prochlorperazine (COMPAZINE) 10 MG tablet Take 1 tablet (10 mg total) by mouth every 6 (six) hours as needed (Nausea or vomiting).  30 tablet  1  . prochlorperazine (COMPAZINE) 25 MG suppository Place 25 mg rectally every 12 (twelve) hours as needed.      . warfarin (COUMADIN) 5 MG tablet Take 7.5 mg by mouth daily. 1.5 tabs everyday        Assessment: Patient with chronic warfarin for neck DVT.  INR < 2.  Home dosing known. Goal of Therapy:  INR 2-3 Monitor platelets by anticoagulation protocol: Yes   Plan:  Warfarin 7.5mg  po x1 a1800 Daily INR   Abigail [name], Abigail [name] 02/20/2012,1:31 AM

## 2012-02-20 NOTE — Progress Notes (Signed)
ANTIBIOTIC CONSULT NOTE - INITIAL  Pharmacy Consult for levofloxacin Indication: pneumonia  Allergies  Allergen Reactions  . Lisinopril Cough  . Codeine Itching  . Oxycodone Nausea Only  . Vicodin (Hydrocodone-Acetaminophen) Nausea Only    Patient Measurements: Height: 5' (152.4 cm) Weight: 229 lb (103.874 kg) IBW/kg (Calculated) : 45.5  Adjusted Body Weight:   Vital Signs: Temp: 99 F (37.2 C) (01/15 0038) Temp src: Oral (01/15 0038) BP: 144/97 mmHg (01/15 0038) Pulse Rate: 123  (01/15 0038) Intake/Output from previous day: 01/14 0701 - 01/15 0700 In: -  Out: 1 [Urine:1] Intake/Output from this shift: Total I/O In: -  Out: 1 [Urine:1]  Labs:  [facility] 02/19/12 1545  WBC 7.8  HGB 10.4*  PLT 207  LABCREA --  CREATININE 0.71   Estimated Creatinine Clearance: 83.4 ml/min (by C-G formula based on Cr of 0.71). No results found for this basename: VANCOTROUGH:2,VANCOPEAK:2,VANCORANDOM:2,GENTTROUGH:2,GENTPEAK:2,GENTRANDOM:2,TOBRATROUGH:2,TOBRAPEAK:2,TOBRARND:2,AMIKACINPEAK:2,AMIKACINTROU:2,AMIKACIN:2, in the last 72 hours   Microbiology: No results found for this or any previous visit (from the past 720 hour(s)).  Medical History: Past Medical History  Diagnosis Date  . Hypertension   . Obesity   . Diverticulitis   . Hemorrhoids   . Colonic polyp   . GERD (gastroesophageal reflux disease)   . Cancer     BREAST, FALLOPIAN TUBE,  . Blood clot of neck vein     Medications:  Anti-infectives     Start     Dose/Rate Route Frequency Ordered Stop   02/20/12 2200   levofloxacin (LEVAQUIN) IVPB 750 mg        750 mg 100 mL/hr over 90 Minutes Intravenous Daily at bedtime 02/20/12 0039     02/19/12 2245   levofloxacin (LEVAQUIN) IVPB 750 mg        750 mg 100 mL/hr over 90 Minutes Intravenous  Once 02/19/12 2245 02/20/12 0025         Assessment: Patient with PNA.  First dose of antibiotics already given in ED.   Goal of Therapy:  Levofloxacin dosed based  on patient weight and renal function   Plan:  Levofloxacin 750mg  iv q24hr  Aleene Davidson Crowford 02/20/2012,1:22 AM

## 2012-02-20 NOTE — Progress Notes (Signed)
   CARE MANAGEMENT NOTE 02/20/2012  Patient:  Abigail [name], Abigail [name]   Account Number:  [id number]  Date Initiated:  02/20/2012  Documentation initiated by:  Jiles Crocker  Subjective/Objective Assessment:   ADMITTED WITH POSSIBLE PNEUMONIA, SOB     Action/Plan:   PCP - Dr. Sharlot Gowda.  LIVES AT HOME WITH SPOUSE   Anticipated DC Date:  02/27/2012   Anticipated DC Plan:  HOME/SELF CARE      DC Planning Services  CM consult          Status of service:  In process, will continue to follow Medicare Important Message given?  NA - LOS <3 / Initial given by admissions (If response is "NO", the following Medicare IM given date fields will be blank)  Per UR Regulation:  Reviewed for med. necessity/level of care/duration of stay  Comments:  02/20/2012- B Yong Grieser RN,BSN,MHA

## 2012-02-21 LAB — PROTIME-INR: INR: 1.42 (ref 0.00–1.49)

## 2012-02-21 MED ORDER — ALBUTEROL SULFATE (5 MG/ML) 0.5% IN NEBU
2.5000 mg | INHALATION_SOLUTION | RESPIRATORY_TRACT | Status: DC | PRN
  Administered 2012-02-21 – 2012-02-24 (×4): 2.5 mg via RESPIRATORY_TRACT
  Filled 2012-02-21 (×4): qty 0.5

## 2012-02-21 MED ORDER — SODIUM CHLORIDE 0.9 % IJ SOLN
10.0000 mL | INTRAMUSCULAR | Status: DC | PRN
  Administered 2012-02-21 – 2012-02-26 (×5): 10 mL

## 2012-02-21 MED ORDER — WARFARIN SODIUM 10 MG PO TABS
10.0000 mg | ORAL_TABLET | Freq: Once | ORAL | Status: AC
  Filled 2012-02-21: qty 1

## 2012-02-21 NOTE — Progress Notes (Signed)
[facility] PROGRESS NOTE  Abigail [name] NWG:[id number] DOB: 1956-02-02 DOA: 02/19/2012 PCP: Carollee Herter, MD  Assessment/Plan: 1. Shortness of breath - cause is not clear. Patient is mildly febrile. Patient at this time is being empirically started on Levaquin for possibility of pneumonia. Negative influenza panel. Closely observe in telemetry. 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 Is not on O2 at home- will check O2 on room air and ambulating- may need home O2 due to progression of fibrosis- has seen Dr. Marchelle Gearing in the past- will need follow up appointment BNP low nebs  #2. Sinus tachycardia -patient has been worked up for this as outpatient with cardiologist and no specific cause has been found. 2-D echo done in July 2013 was showing EF of 60-65%. Patient has not taken her Toprol today and I have ordered one dose of which. Check thyroid function tests. Closely observe in telemetry. 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   Code Status: full Family Communication: patient at bedside Disposition Plan: D/C tomm?   Consultants:    Procedures:    Antibiotics:  levaquin  HPI/Subjective: Feeling better this AM, SOB with exertion  Objective: Filed Vitals:   02/20/12 1500 02/20/12 2200 02/21/12 0500 02/21/12 0754  BP: 130/80 133/87 135/89   Pulse: 94 98 80   Temp: 98.6 F (37 C) 98.3 F (36.8 C) 98 F (36.7 C)   TempSrc: Oral Oral Oral   Resp: 20 20 20    Height:      Weight:   105.8 kg (233 lb 4 oz) 105.325 kg (232 lb 3.2 oz)  SpO2: 95% 99% 100%     Intake/Output Summary (Last 24 hours) at 02/21/12 0922 Last data filed at 02/20/12 2300  Gross per 24 hour  Intake 1794.5 ml  Output      6 ml  Net 1788.5 ml   Filed  Weights   02/20/12 0034 02/21/12 0500 02/21/12 0754  Weight: 103.874 kg (229 lb) 105.8 kg (233 lb 4 oz) 105.325 kg (232 lb 3.2 oz)    Exam:   General:  A+Ox3, NAD  Cardiovascular: rrr  Respiratory: clear but tight, no increased work of breathing  Abdomen: +BS, obese  Port in right chest wall  Data Reviewed: Basic Metabolic Panel:  Lab 02/20/12 5784 02/19/12 1545  NA 132* 131*  K 4.4 4.1  CL 95* 92*  CO2 27 27  GLUCOSE 221* 85  BUN 12 12  CREATININE 0.73 0.71  CALCIUM 9.3 9.3  MG -- --  PHOS -- --   Liver Function Tests:  Lab 02/19/12 1545  AST 28  ALT 16  ALKPHOS 99  BILITOT 0.4  PROT 6.7  ALBUMIN 3.0*   No results found for this basename: LIPASE:5,AMYLASE:5 in the last 168 hours No results found for this basename: AMMONIA:5 in the last 168 hours CBC:  Lab 02/20/12 0440 02/19/12 1545  WBC 6.7 7.8  NEUTROABS -- 5.8  HGB 10.0* 10.4*  HCT 31.2* 31.7*  MCV 93.1 91.9  PLT 189 207   Cardiac Enzymes:  Lab 02/19/12 1545  CKTOTAL --  CKMB --  CKMBINDEX --  TROPONINI <0.30   BNP (last 3 results)  Basename 02/19/12 1545 08/14/11 1522  PROBNP 21.7 18.0   CBG: No results found for this basename: GLUCAP:5 in  the last 168 hours  Recent Results (from the past 240 hour(s))  CULTURE, BLOOD (ROUTINE X 2)     Status: Normal (Preliminary result)   Collection Time   02/19/12 11:36 PM      Component Value Range Status Comment   Specimen Description BLOOD LEFT HAND   Final    Special Requests BOTTLES DRAWN AEROBIC AND ANAEROBIC 5CC   Final    Culture  Setup Time 02/20/2012 06:16   Final    Culture     Final    Value:        BLOOD CULTURE RECEIVED NO GROWTH TO DATE CULTURE WILL BE HELD FOR 5 DAYS BEFORE ISSUING A FINAL NEGATIVE REPORT   Report Status PENDING   Incomplete   CULTURE, BLOOD (ROUTINE X 2)     Status: Normal (Preliminary result)   Collection Time   02/19/12 11:36 PM      Component Value Range Status Comment   Specimen Description BLOOD LEFT  ANTECUBITAL   Final    Special Requests BOTTLES DRAWN AEROBIC AND ANAEROBIC 5CC   Final    Culture  Setup Time 02/20/2012 06:16   Final    Culture     Final    Value:        BLOOD CULTURE RECEIVED NO GROWTH TO DATE CULTURE WILL BE HELD FOR 5 DAYS BEFORE ISSUING A FINAL NEGATIVE REPORT   Report Status PENDING   Incomplete      Studies: Dg Chest 2 View  02/19/2012  *RADIOLOGY REPORT*  Clinical Data: Shortness of breath, cough.  CHEST - 2 VIEW  Comparison: 09/27/2011  Findings: Right Port-A-Cath remains in place, unchanged. Cardiomegaly.  Increasing interstitial prominence throughout the lungs, likely interstitial edema.  Increasing bibasilar densities, likely atelectasis.  No effusions.  No acute bony abnormality.  IMPRESSION: Increasing interstitial prominence, likely edema and bibasilar atelectasis.   Original Report Authenticated By: Charlett Nose, M.D.    Ct Angio Chest W/cm &/or Wo Cm  02/19/2012  *RADIOLOGY REPORT*  Clinical Data: Tachycardia, shortness of breath.  History breast carcinoma.  The  CT ANGIOGRAPHY CHEST  Technique:  Multidetector CT imaging of the chest using the standard protocol during bolus administration of intravenous contrast. Multiplanar reconstructed images including MIPs were obtained and reviewed to evaluate the vascular anatomy.  Contrast: OMNIPAQUE IOHEXOL 350 MG/ML SOLN  Comparison: 09/27/2011  Findings: There is good contrast opacification of the pulmonary artery branches.  No discrete filling defect to suggest acute PE.Patient breathing during the acquisition degrades multiple images. Adequate contrast opacification of the thoracic aorta with no evidence of dissection, aneurysm, or stenosis. There is classic 3-vessel brachiocephalic arch anatomy.  Right port catheter extends to distal SVC.  No pleural or pericardial effusion.  Stable sub centimeter anterior mediastinal and retrocrural lymph nodes.  No hilar adenopathy.  Probable central right subclavian vein short  segment occlusion or stenosis with opacification of multiple body wall and mediastinal collateral venous channels.  Progressive fibrotic changes in both lung bases with bronchiectasis.  There are coarse fibrotic changes in both upper lobes with pleural-based areas of coarse consolidation, also progressive since the prior study.  Decreased lung volumes. Patchy somewhat geographic ground- glass opacities throughout the remainder the lung fields. Exuberant flowing osteophytes across multiple contiguous levels in the mid and lower thoracic spine.  IMPRESSION:  1.  Negative for acute PE or thoracic aortic dissection. 2.  Progressive pulmonary parenchymal disease as above.   Original Report Authenticated By: D. Andria Rhein, MD  Scheduled Meds:    . amLODipine  5 mg Oral Daily  . cholecalciferol  3,000 Units Oral Daily  . levofloxacin (LEVAQUIN) IV  750 mg Intravenous QHS  . metoprolol succinate  100 mg Oral Daily  . potassium chloride SA  20 mEq Oral Daily  . sodium chloride  3 mL Intravenous Q12H  . Warfarin - Pharmacist Dosing Inpatient   Does not apply q1800   Continuous Infusions:   Principal Problem:  *SOB (shortness of breath) Active Problems:  Fallopian tube cancer, BRCA2 positive  ILD (interstitial lung disease)  DVT (deep venous thrombosis)    Time spent: 35    [facility], Safir Michalec  [facility] Pager [phone]. If 8PM-8AM, please contact night-coverage at [email or web address], password [facility] 02/21/2012, 9:22 AM  LOS: 2 days

## 2012-02-21 NOTE — Progress Notes (Signed)
ANTICOAGULATION CONSULT NOTE - Initial Consult  Pharmacy Consult for warfarin Indication: Right subclavian DVT  Allergies  Allergen Reactions  . Lisinopril Cough  . Codeine Itching  . Oxycodone Nausea Only  . Vicodin (Hydrocodone-Acetaminophen) Nausea Only    Patient Measurements: Height: 5' (152.4 cm) Weight: 232 lb 3.2 oz (105.325 kg) IBW/kg (Calculated) : 45.5    Vital Signs: Temp: 98 F (36.7 C) (01/16 0500) Temp src: Oral (01/16 0500) BP: 135/89 mmHg (01/16 0500) Pulse Rate: 80  (01/16 0500)  Labs:  Basename 02/21/12 0519 02/20/12 0440 02/19/12 1545  HGB -- 10.0* 10.4*  HCT -- 31.2* 31.7*  PLT -- 189 207  APTT -- -- --  LABPROT 17.0* 16.1* 15.3*  INR 1.42 1.32 1.23  HEPARINUNFRC -- -- --  CREATININE -- 0.73 0.71  CKTOTAL -- -- --  CKMB -- -- --  TROPONINI -- -- <0.30    Estimated Creatinine Clearance: 84 ml/min (by C-G formula based on Cr of 0.73).   Medical History: Past Medical History  Diagnosis Date  . Hypertension   . Obesity   . Diverticulitis   . Hemorrhoids   . Colonic polyp   . GERD (gastroesophageal reflux disease)   . Cancer     BREAST, FALLOPIAN TUBE,  . Blood clot of neck vein     Medications:  Prescriptions prior to admission  Medication Sig Dispense Refill  . amLODipine (NORVASC) 5 MG tablet Take 1 tablet (5 mg total) by mouth daily.  30 tablet  11  . amoxicillin-clavulanate (AUGMENTIN) 875-125 MG per tablet Take 1 tablet by mouth 2 (two) times daily. Day 5 of therapy      . benzonatate (TESSALON) 200 MG capsule Take 1 capsule (200 mg total) by mouth 3 (three) times daily as needed for cough.  20 capsule  0  . Cholecalciferol (VITAMIN D3) 3000 UNITS TABS Take 3,000 Units by mouth daily.       Marland Kitchen dexamethasone (DECADRON) 4 MG tablet Take 2 tablets by mouth daily starting the day after chemotherapy for 2 days. Take with food.  30 tablet  1  . KLOR-CON M20 20 MEQ tablet TAKE 1 TABLET BY MOUTH EVERY DAY  30 tablet  1  .  lidocaine-prilocaine (EMLA) cream Apply topically as needed. For PAC  30 g  1  . metoprolol succinate (TOPROL XL) 100 MG 24 hr tablet Take 1 tablet (100 mg total) by mouth daily. Take with or immediately following a meal.  30 tablet  11  . ondansetron (ZOFRAN) 8 MG tablet Take 1 tablet two times a day starting the day after chemo for 2 days. Then take 1 tablet two times a day as needed for nausea or vomiting.  30 tablet  1  . PRESCRIPTION MEDICATION [facility] at [facility]. Last treatment was on 02-09-12. Next treatment is not scheduled at this time. Followed by Dr. Darnelle Catalan      . prochlorperazine (COMPAZINE) 10 MG tablet Take 1 tablet (10 mg total) by mouth every 6 (six) hours as needed (Nausea or vomiting).  30 tablet  1  . prochlorperazine (COMPAZINE) 25 MG suppository Place 25 mg rectally every 12 (twelve) hours as needed.      . warfarin (COUMADIN) 5 MG tablet Take 7.5 mg by mouth daily. 1.5 tabs everyday        Assessment:  57 yo F on chronic anticoagulation with warfarin for right subclavian DVT.   Admitted with SOB.   Dose PTA reported as = 7.5 mg  daily; Last dose PTA reported as 1/13.  Per Dr. Darrall Dears note 1/2, patient should be taking 10 mg on Tue/Thurs and 7.5 mg all other days  INR on admission was below goal at 1.23, INR trending up after dose given on 1/15, INR today is 1.42  No CBC okay, okay yesterday with no bleeding reported   Drug Interaction: Can cause INR to increase  Home dose tonight, will not increased to 1.5 x home dose for INR < 2 given possible interaction with Levaquin   Goal of Therapy:  INR 2-3 Monitor platelets by anticoagulation protocol: Yes   Plan:   Warfarin 10 mg po x1 at 1800   Daily INR  Alyssia Heese, Loma Messing PharmD Pager #: [phone] 12:39 PM 02/21/2012

## 2012-02-21 NOTE — Progress Notes (Signed)
Patient ambulated on room air and oxygen saturation dropped to 67%.  Patient placed back on 3 Liters nasal canula and oxygen saturation is now 93%.  Patient is now resting in the bed.  Dr. Benjamine Mola notified.  Will continue to monitor patient.

## 2012-02-22 DIAGNOSIS — C801 Malignant (primary) neoplasm, unspecified: Secondary | ICD-10-CM

## 2012-02-22 LAB — BASIC METABOLIC PANEL
Calcium: 8.9 mg/dL (ref 8.4–10.5)
GFR calc Af Amer: >90 mL/min (ref >90)
GFR calc non Af Amer: >90 mL/min (ref >90)
Sodium: 133 mEq/L — ABNORMAL LOW (ref 135–145)

## 2012-02-22 LAB — CBC
MCH: 31 pg (ref 26.0–34.0)
MCHC: 33.2 g/dL (ref 30.0–36.0)
Platelets: 180 10*3/uL (ref 150–400)

## 2012-02-22 LAB — PROTIME-INR: Prothrombin Time: 17.3 seconds — ABNORMAL HIGH (ref 11.6–15.2)

## 2012-02-22 MED ORDER — WARFARIN SODIUM 2.5 MG PO TABS
12.5000 mg | ORAL_TABLET | Freq: Once | ORAL | Status: AC
  Administered 2012-02-22: 12.5 mg via ORAL
  Filled 2012-02-22: qty 1

## 2012-02-22 MED ORDER — PIPERACILLIN-TAZOBACTAM 3.375 G IVPB
3.3750 g | Freq: Three times a day (TID) | INTRAVENOUS | Status: DC
  Administered 2012-02-22 – 2012-02-27 (×15): 3.375 g via INTRAVENOUS
  Filled 2012-02-22 (×18): qty 50

## 2012-02-22 MED ORDER — VANCOMYCIN HCL 10 G IV SOLR
2000.0000 mg | Freq: Once | INTRAVENOUS | Status: AC
  Administered 2012-02-22: 2000 mg via INTRAVENOUS
  Filled 2012-02-22: qty 2000

## 2012-02-22 MED ORDER — VANCOMYCIN HCL IN DEXTROSE 1-5 GM/200ML-% IV SOLN
1000.0000 mg | Freq: Two times a day (BID) | INTRAVENOUS | Status: DC
  Administered 2012-02-22 – 2012-02-24 (×5): 1000 mg via INTRAVENOUS
  Filled 2012-02-22 (×6): qty 200

## 2012-02-22 NOTE — Progress Notes (Signed)
ANTICOAGULATION CONSULT NOTE - Follow Up Consult  Pharmacy Consult for Coumadin Indication: Right subclavian DVT  Allergies  Allergen Reactions  . Lisinopril Cough  . Codeine Itching  . Oxycodone Nausea Only  . Vicodin (Hydrocodone-Acetaminophen) Nausea Only    Patient Measurements: Height: 5' (152.4 cm) Weight: 232 lb 9.4 oz (105.5 kg) IBW/kg (Calculated) : 45.5  Heparin Dosing Weight:   Vital Signs: Temp: 99 F (37.2 C) (01/17 0627) Temp src: Oral (01/17 0627) BP: 141/98 mmHg (01/17 0627) Pulse Rate: 102  (01/17 0627)  Labs:  Basename 02/22/12 0620 02/21/12 0519 02/20/12 0440 02/19/12 1545  HGB 9.4* -- 10.0* --  HCT 28.3* -- 31.2* 31.7*  PLT 180 -- 189 207  APTT -- -- -- --  LABPROT 17.3* 17.0* 16.1* --  INR 1.46 1.42 1.32 --  HEPARINUNFRC -- -- -- --  CREATININE 0.75 -- 0.73 0.71  CKTOTAL -- -- -- --  CKMB -- -- -- --  TROPONINI -- -- -- <0.30    Estimated Creatinine Clearance: 84.1 ml/min (by C-G formula based on Cr of 0.75).   Medications:  Coumadin PTA: 7.5mg  daily.  Per Dr. Darrall Dears note on 1/2, patient should be taking 10 mg on Tue/Thurs and 7.5 mg all other days   Assessment: 57 yo F on chronic anticoagulation with warfarin for right subclavian DVT. Admitted with SOB. INR 1.46 -subtherapeutic today.  Warfarin 10mg  po x 1 ordered for 1/16, but RN missed administration. Last dose therefore was 1/15.   Hgb small decline, No bleeding reported  No DDIs noted (levaquin has been d/c'd) Renal fxn stable  Goal of Therapy:  INR 2-3 Monitor platelets by anticoagulation protocol: Yes   Plan:  1.  Coumadin 12.5mg  po x 1 tonight 2.  F/u daily PT/INR, CBC   Haynes Hoehn, PharmD 02/22/2012 8:50 AM  Pager: [phone]

## 2012-02-22 NOTE — Progress Notes (Signed)
[facility] PROGRESS NOTE  Abigail [name] UJW:[id number] DOB: 1956-01-17 DOA: 02/19/2012 PCP: Carollee Herter, MD  Assessment/Plan: 1. Shortness of breath - cause is not clear. Was afebrile but spiked another temperature last night. Will broaden out abx as patient is on chemo- WBC negative. Negative influenza panel. 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 Is not on O2 at home- will need home O2 due to progression of fibrosis- has seen Dr. Marchelle Gearing in the past- will need follow up appointment BNP low nebs  #2. Sinus tachycardia -patient has been worked up for this as outpatient with cardiologist and no specific cause has been found. 2-D echo done in July 2013 was showing EF of 60-65%. Patient has not taken her Toprol today and I have ordered one dose of which. thyroid function tests ok. Closely observe in telemetry. 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   Code Status: full Family Communication: patient at bedside Disposition Plan: D/C tomm?   Consultants:    Procedures:    Antibiotics:  levaquin 1/14-1/17  Vanc/zosyn 1/17  HPI/Subjective: Temperature last night with chills Still SOB with exertion  Objective: Filed Vitals:   02/21/12 1415 02/21/12 2130 02/21/12 2230 02/22/12 0627  BP: 148/96 121/74  141/98  Pulse: 104 122  102  Temp: 98.5 F (36.9 C) 101.2 F (38.4 C) 98.6 F (37 C) 99 F (37.2 C)  TempSrc: Oral Oral  Oral  Resp: 18 24  20   Height:      Weight:    105.5 kg (232 lb 9.4 oz)  SpO2: 90% 98%  98%    Intake/Output Summary (Last 24 hours) at 02/22/12 0756 Last data filed at 02/22/12 0631  Gross per 24 hour  Intake   1080 ml  Output   1250 ml  Net   -170 ml   Filed Weights   02/21/12 0500 02/21/12  0754 02/22/12 0627  Weight: 105.8 kg (233 lb 4 oz) 105.325 kg (232 lb 3.2 oz) 105.5 kg (232 lb 9.4 oz)    Exam:   General:  A+Ox3, NAD  Cardiovascular: rrr  Respiratory: clear but tight, wheezing b/l, no increased work of breathing  Abdomen: +BS, obese  Port in right chest wall  Data Reviewed: Basic Metabolic Panel:  Lab 02/22/12 5621 02/20/12 0440 02/19/12 1545  NA 133* 132* 131*  K 3.6 4.4 4.1  CL 97 95* 92*  CO2 28 27 27   GLUCOSE 103* 221* 85  BUN 17 12 12   CREATININE 0.75 0.73 0.71  CALCIUM 8.9 9.3 9.3  MG -- -- --  PHOS -- -- --   Liver Function Tests:  Lab 02/19/12 1545  AST 28  ALT 16  ALKPHOS 99  BILITOT 0.4  PROT 6.7  ALBUMIN 3.0*   No results found for this basename: LIPASE:5,AMYLASE:5 in the last 168 hours No results found for this basename: AMMONIA:5 in the last 168 hours CBC:  Lab 02/22/12 0620 02/20/12 0440 02/19/12 1545  WBC 8.7 6.7 7.8  NEUTROABS -- -- 5.8  HGB 9.4* 10.0* 10.4*  HCT 28.3* 31.2* 31.7*  MCV 93.4 93.1 91.9  PLT 180 189 207   Cardiac Enzymes:  Lab 02/19/12 1545  CKTOTAL --  CKMB --  CKMBINDEX --  TROPONINI <0.30   BNP (last 3 results)  Basename 02/19/12  1545 08/14/11 1522  PROBNP 21.7 18.0   CBG: No results found for this basename: GLUCAP:5 in the last 168 hours  Recent Results (from the past 240 hour(s))  CULTURE, BLOOD (ROUTINE X 2)     Status: Normal (Preliminary result)   Collection Time   02/19/12 11:36 PM      Component Value Range Status Comment   Specimen Description BLOOD LEFT HAND   Final    Special Requests BOTTLES DRAWN AEROBIC AND ANAEROBIC 5CC   Final    Culture  Setup Time 02/20/2012 06:16   Final    Culture     Final    Value:        BLOOD CULTURE RECEIVED NO GROWTH TO DATE CULTURE WILL BE HELD FOR 5 DAYS BEFORE ISSUING A FINAL NEGATIVE REPORT   Report Status PENDING   Incomplete   CULTURE, BLOOD (ROUTINE X 2)     Status: Normal (Preliminary result)   Collection Time   02/19/12 11:36 PM       Component Value Range Status Comment   Specimen Description BLOOD LEFT ANTECUBITAL   Final    Special Requests BOTTLES DRAWN AEROBIC AND ANAEROBIC 5CC   Final    Culture  Setup Time 02/20/2012 06:16   Final    Culture     Final    Value:        BLOOD CULTURE RECEIVED NO GROWTH TO DATE CULTURE WILL BE HELD FOR 5 DAYS BEFORE ISSUING A FINAL NEGATIVE REPORT   Report Status PENDING   Incomplete      Studies: No results found.  Scheduled Meds:    . amLODipine  5 mg Oral Daily  . cholecalciferol  3,000 Units Oral Daily  . metoprolol succinate  100 mg Oral Daily  . potassium chloride SA  20 mEq Oral Daily  . sodium chloride  3 mL Intravenous Q12H  . warfarin  10 mg Oral ONCE-1800  . Warfarin - Pharmacist Dosing Inpatient   Does not apply q1800   Continuous Infusions:   Principal Problem:  *SOB (shortness of breath) Active Problems:  Fallopian tube cancer, BRCA2 positive  ILD (interstitial lung disease)  DVT (deep venous thrombosis)    Time spent: 35    [facility], Tidus [place]  [facility] Pager [phone]. If 8PM-8AM, please contact night-coverage at [email or web address], password [facility] 02/22/2012, 7:56 AM  LOS: 3 days

## 2012-02-22 NOTE — Progress Notes (Signed)
ANTIBIOTIC CONSULT NOTE - INITIAL  Pharmacy Consult for Vancomycin/Zosyn Indication: pneumonia  Allergies  Allergen Reactions  . Lisinopril Cough  . Codeine Itching  . Oxycodone Nausea Only  . Vicodin (Hydrocodone-Acetaminophen) Nausea Only    Patient Measurements: Height: 5' (152.4 cm) Weight: 232 lb 9.4 oz (105.5 kg) IBW/kg (Calculated) : 45.5  Adjusted Body Weight:   Vital Signs: Temp: 99 F (37.2 C) (01/17 0627) Temp src: Oral (01/17 0627) BP: 141/98 mmHg (01/17 0627) Pulse Rate: 102  (01/17 0627) Intake/Output from previous day: 01/16 0701 - 01/17 0700 In: 1080 [P.O.:1080] Out: 1250 [Urine:1250] Intake/Output from this shift:    Labs:  Basename 02/22/12 0620 02/20/12 0440 02/19/12 1545  WBC 8.7 6.7 7.8  HGB 9.4* 10.0* 10.4*  PLT 180 189 207  LABCREA -- -- --  CREATININE 0.75 0.73 0.71   Estimated Creatinine Clearance: 84.1 ml/min (by C-G formula based on Cr of 0.75). No results found for this basename: VANCOTROUGH:2,VANCOPEAK:2,VANCORANDOM:2,GENTTROUGH:2,GENTPEAK:2,GENTRANDOM:2,TOBRATROUGH:2,TOBRAPEAK:2,TOBRARND:2,AMIKACINPEAK:2,AMIKACINTROU:2,AMIKACIN:2, in the last 72 hours   Microbiology: Recent Results (from the past 720 hour(s))  CULTURE, BLOOD (ROUTINE X 2)     Status: Normal (Preliminary result)   Collection Time   02/19/12 11:36 PM      Component Value Range Status Comment   Specimen Description BLOOD LEFT HAND   Final    Special Requests BOTTLES DRAWN AEROBIC AND ANAEROBIC 5CC   Final    Culture  Setup Time 02/20/2012 06:16   Final    Culture     Final    Value:        BLOOD CULTURE RECEIVED NO GROWTH TO DATE CULTURE WILL BE HELD FOR 5 DAYS BEFORE ISSUING A FINAL NEGATIVE REPORT   Report Status PENDING   Incomplete   CULTURE, BLOOD (ROUTINE X 2)     Status: Normal (Preliminary result)   Collection Time   02/19/12 11:36 PM      Component Value Range Status Comment   Specimen Description BLOOD LEFT ANTECUBITAL   Final    Special Requests BOTTLES  DRAWN AEROBIC AND ANAEROBIC 5CC   Final    Culture  Setup Time 02/20/2012 06:16   Final    Culture     Final    Value:        BLOOD CULTURE RECEIVED NO GROWTH TO DATE CULTURE WILL BE HELD FOR 5 DAYS BEFORE ISSUING A FINAL NEGATIVE REPORT   Report Status PENDING   Incomplete     Medical History: Past Medical History  Diagnosis Date  . Hypertension   . Obesity   . Diverticulitis   . Hemorrhoids   . Colonic polyp   . GERD (gastroesophageal reflux disease)   . Cancer     BREAST, FALLOPIAN TUBE,  . Blood clot of neck vein     Medications:  Levaquin 1/14-->1/17  Assessment: 56 yoF previously on levaquin for PNA, now to start Vancomycin and Zosyn for broadened coverage due to continued SOB and new fever.    Renal fxn stable, CrCl 84 (N87)  WBC WNL  Tm24h: 101.2 (currently afebrile)  1/14 blood cultures: NGTD  Weight >100kg (105kg)   Goal of Therapy:  Vancomycin trough level 15-20 mcg/ml  Plan:  1.  Vancomycin loading dose 2 grams IV x 1, then maintenance dose 1 gram IV q 12 hours 2.  Zosyn 3.375 grams IV q 8 hours extended infusion 3.  F/u vancomycin trough at steady state, renal fxn, CBC, T, cultures, clinical course  Haynes Hoehn, PharmD 02/22/2012 8:33 AM  Pager:  [phone]   Aws Shere, Jill Side E 02/22/2012,8:29 AM

## 2012-02-23 LAB — BASIC METABOLIC PANEL
BUN: 11 mg/dL (ref 6–23)
GFR calc Af Amer: >90 mL/min (ref >90)
GFR calc non Af Amer: >90 mL/min (ref >90)
Potassium: 3.3 mEq/L — ABNORMAL LOW (ref 3.5–5.1)
Sodium: 128 mEq/L — ABNORMAL LOW (ref 135–145)

## 2012-02-23 LAB — CBC
MCHC: 32.3 g/dL (ref 30.0–36.0)
RDW: 16.8 % — ABNORMAL HIGH (ref 11.5–15.5)

## 2012-02-23 LAB — PROTIME-INR
INR: 1.82 — ABNORMAL HIGH (ref 0.00–1.49)
Prothrombin Time: 20.4 seconds — ABNORMAL HIGH (ref 11.6–15.2)

## 2012-02-23 MED ORDER — WARFARIN SODIUM 7.5 MG PO TABS
7.5000 mg | ORAL_TABLET | Freq: Once | ORAL | Status: AC
  Administered 2012-02-23: 7.5 mg via ORAL
  Filled 2012-02-23: qty 1

## 2012-02-23 MED ORDER — POTASSIUM CHLORIDE CRYS ER 20 MEQ PO TBCR
40.0000 meq | EXTENDED_RELEASE_TABLET | Freq: Once | ORAL | Status: AC
  Administered 2012-02-23: 40 meq via ORAL
  Filled 2012-02-23: qty 2

## 2012-02-23 MED ORDER — FUROSEMIDE 10 MG/ML IJ SOLN
40.0000 mg | Freq: Once | INTRAMUSCULAR | Status: AC
  Administered 2012-02-23: 40 mg via INTRAVENOUS
  Filled 2012-02-23: qty 4

## 2012-02-23 NOTE — Progress Notes (Signed)
[facility] PROGRESS NOTE  Abigail [name] ZOX:[id number] DOB: 1955-04-23 DOA: 02/19/2012 PCP: Carollee Herter, MD Brief History: 57 year-old female with history of fallopian tube cancer on chemotherapy and had received recent chemotherapy on January 2 presents to the ER because of progressive shortness of breath on 02/19/2012. Patient has been having dyspnea for last 2-3 months and has been following with pulmonologist and cardiologist. Patient also has been recently placed on Augmentin by her primary care physician for the same reason. Patient states her shortness of breath had worsened and was instructed to come to the ER. Since patient is short of breath and tachycardic CT angiogram of the chest was done which was negative for pulmonary embolism but did show progression of fibrosis.    Assessment/Plan: Shortness of breath Etiology unclear. Spiked another temperature last night. Antibiotics were broadened to Vancomycin and Zosyn on 02/22/2012, as patient is on chemo. WBC normal. Influenza panel negative. Patient still complaining of exertional dyspnea, will request pulmonary consultation as patient still spiking temperatures. 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 Is not on O2 at home, will need desaturation study prior to discharge to determine if the patient needs oxygen at home. Has seen Dr. Marchelle Gearing in the past. Pro-BNP normal on admission. Continue PRN neb treatments.   Fever Unclear etiology. Antibiotics as indicated above. CT angio negative for PE on 02/19/2012, progressive pulmonary parenchymal disease.  Sinus tachycardia Patient has been worked up for this as outpatient with cardiologist and no specific cause has been found. 2-D echo done in July 2013 was showing EF of 60-65%. Continue metoprolol. Thyroid function tests normal on 02/20/2012. No events on telemetry, discontinue telemetry. CTA negative.  Fallopian tube carcinoma Further  management per oncologist.   Right subclavian DVT Coumadin per pharmacy.  Code Status: Full Family Communication: Patient at bedside Disposition Plan: Pending improvement in fevers.   Consultants:  Pulmonary  Procedures:  None  Antibiotics:  Levaquin 1/14 >> 1/17  Vancomycin 1/17 >>  Zosyn 1/17 >>  HPI/Subjective: Had another fever during the night. Shortness of breath with exertion.  Objective: Filed Vitals:   02/22/12 1458 02/22/12 2137 02/23/12 0153 02/23/12 0611  BP: 137/71 130/97  129/71  Pulse: 114 120  104  Temp: 99.4 F (37.4 C) 100.9 F (38.3 C) 98.5 F (36.9 C) 98.9 F (37.2 C)  TempSrc: Oral Oral Oral Oral  Resp: 18 18  18   Height:      Weight:    104.9 kg (231 lb 4.2 oz)  SpO2: 98% 98%  100%    Intake/Output Summary (Last 24 hours) at 02/23/12 0846 Last data filed at 02/23/12 0613  Gross per 24 hour  Intake    590 ml  Output   2400 ml  Net  -1810 ml   Filed Weights   02/21/12 0754 02/22/12 0627 02/23/12 0611  Weight: 105.325 kg (232 lb 3.2 oz) 105.5 kg (232 lb 9.4 oz) 104.9 kg (231 lb 4.2 oz)    Exam: Physical Exam: General: Awake, Oriented, No acute distress. HEENT: EOMI. Neck: Supple CV: S1 and S2 Lungs: Good air movement, crackles at bases bilaterally, worse on the right then on left.  Abdomen: Soft, Nontender, Nondistended, +bowel sounds. Ext: Good pulses. Trace edema.  Data Reviewed: Basic Metabolic Panel:  Lab 02/23/12 8119 02/22/12 0620 02/20/12 0440 02/19/12 1545  NA 128* 133* 132* 131*  K 3.3* 3.6 4.4 4.1  CL 91* 97 95* 92*  CO2 29 28 27  27  GLUCOSE 146* 103* 221* 85  BUN 11 17 12 12   CREATININE 0.76 0.75 0.73 0.71  CALCIUM 8.5 8.9 9.3 9.3  MG -- -- -- --  PHOS -- -- -- --   Liver Function Tests:  Lab 02/19/12 1545  AST 28  ALT 16  ALKPHOS 99  BILITOT 0.4  PROT 6.7  ALBUMIN 3.0*   No results found for this basename: LIPASE:5,AMYLASE:5 in the last 168 hours No results found for this basename: AMMONIA:5  in the last 168 hours CBC:  Lab 02/23/12 0445 02/22/12 0620 02/20/12 0440 02/19/12 1545  WBC 7.7 8.7 6.7 7.8  NEUTROABS -- -- -- 5.8  HGB 8.7* 9.4* 10.0* 10.4*  HCT 26.9* 28.3* 31.2* 31.7*  MCV 91.8 93.4 93.1 91.9  PLT 152 180 189 207   Cardiac Enzymes:  Lab 02/19/12 1545  CKTOTAL --  CKMB --  CKMBINDEX --  TROPONINI <0.30   BNP (last 3 results)  Basename 02/19/12 1545 08/14/11 1522  PROBNP 21.7 18.0   CBG: No results found for this basename: GLUCAP:5 in the last 168 hours  Recent Results (from the past 240 hour(s))  CULTURE, BLOOD (ROUTINE X 2)     Status: Normal (Preliminary result)   Collection Time   02/19/12 11:36 PM      Component Value Range Status Comment   Specimen Description BLOOD LEFT HAND   Final    Special Requests BOTTLES DRAWN AEROBIC AND ANAEROBIC 5CC   Final    Culture  Setup Time 02/20/2012 06:16   Final    Culture     Final    Value:        BLOOD CULTURE RECEIVED NO GROWTH TO DATE CULTURE WILL BE HELD FOR 5 DAYS BEFORE ISSUING A FINAL NEGATIVE REPORT   Report Status PENDING   Incomplete   CULTURE, BLOOD (ROUTINE X 2)     Status: Normal (Preliminary result)   Collection Time   02/19/12 11:36 PM      Component Value Range Status Comment   Specimen Description BLOOD LEFT ANTECUBITAL   Final    Special Requests BOTTLES DRAWN AEROBIC AND ANAEROBIC 5CC   Final    Culture  Setup Time 02/20/2012 06:16   Final    Culture     Final    Value:        BLOOD CULTURE RECEIVED NO GROWTH TO DATE CULTURE WILL BE HELD FOR 5 DAYS BEFORE ISSUING A FINAL NEGATIVE REPORT   Report Status PENDING   Incomplete      Studies: No results found.  Scheduled Meds:    . amLODipine  5 mg Oral Daily  . cholecalciferol  3,000 Units Oral Daily  . metoprolol succinate  100 mg Oral Daily  . piperacillin-tazobactam (ZOSYN)  IV  3.375 g Intravenous Q8H  . potassium chloride SA  20 mEq Oral Daily  . potassium chloride  40 mEq Oral Once  . sodium chloride  3 mL Intravenous Q12H    . vancomycin  1,000 mg Intravenous Q12H  . Warfarin - Pharmacist Dosing Inpatient   Does not apply q1800   Continuous Infusions:   Principal Problem:  *SOB (shortness of breath) Active Problems:  Fallopian tube cancer, BRCA2 positive  ILD (interstitial lung disease)  DVT (deep venous thrombosis)   Lupita Rosales A  [facility] Pager [phone]. If 7PM-7AM, please contact night-coverage at [email or web address], password [facility] 02/23/2012, 8:46 AM  LOS: 4 days

## 2012-02-23 NOTE — Consult Note (Signed)
Dictation #:  [id number]

## 2012-02-23 NOTE — Progress Notes (Signed)
ANTICOAGULATION CONSULT NOTE - Follow Up Consult  Pharmacy Consult for Coumadin Indication: Right subclavian DVT  Allergies  Allergen Reactions  . Lisinopril Cough  . Codeine Itching  . Oxycodone Nausea Only  . Vicodin (Hydrocodone-Acetaminophen) Nausea Only   Labs:  Basename 02/23/12 0445 02/22/12 0620 02/21/12 0519  HGB 8.7* 9.4* --  HCT 26.9* 28.3* --  PLT 152 180 --  APTT -- -- --  LABPROT 20.4* 17.3* 17.0*  INR 1.82* 1.46 1.42  HEPARINUNFRC -- -- --  CREATININE 0.76 0.75 --  CKTOTAL -- -- --  CKMB -- -- --  TROPONINI -- -- --    Assessment: 57 yo F on chronic anticoagulation with warfarin for right subclavian DVT. Admitted with SOB.  Patient reportedly on Coumadin 7.5mg  po daily.    Dose on 1/16 not charted as given.  Therefore a boosted dose of 12.5mg  given last night and INR up to 1.8 today.    Hgb down to 8.7, no documentation of bleeding. No DDI.Stable renal function.   Goal of Therapy:  INR 2-3 Monitor platelets by anticoagulation protocol: Yes   Plan:   Coumadin 7.5mg  po x 1 tonight  Daily PT/INR  Pharmacy will f/u  Geoffry Paradise, PharmD, BCPS Pager: [phone] 10:51 AM Pharmacy #: [id number]

## 2012-02-24 DIAGNOSIS — E876 Hypokalemia: Secondary | ICD-10-CM | POA: Diagnosis present

## 2012-02-24 DIAGNOSIS — I498 Other specified cardiac arrhythmias: Secondary | ICD-10-CM

## 2012-02-24 LAB — PROTIME-INR
INR: 2.32 — ABNORMAL HIGH (ref 0.00–1.49)
Prothrombin Time: 24.4 seconds — ABNORMAL HIGH (ref 11.6–15.2)

## 2012-02-24 LAB — BASIC METABOLIC PANEL
BUN: 11 mg/dL (ref 6–23)
CO2: 32 mEq/L (ref 19–32)
Calcium: 8.4 mg/dL (ref 8.4–10.5)
Chloride: 89 mEq/L — ABNORMAL LOW (ref 96–112)
Creatinine, Ser: 0.8 mg/dL (ref 0.50–1.10)
GFR calc Af Amer: >90 mL/min (ref >90)
GFR calc non Af Amer: 80 mL/min — ABNORMAL LOW (ref >90)
Glucose, Bld: 112 mg/dL — ABNORMAL HIGH (ref 70–99)
Potassium: 3 mEq/L — ABNORMAL LOW (ref 3.5–5.1)
Sodium: 129 mEq/L — ABNORMAL LOW (ref 135–145)

## 2012-02-24 LAB — URINALYSIS, ROUTINE W REFLEX MICROSCOPIC
Ketones, ur: NEGATIVE mg/dL
Leukocytes, UA: NEGATIVE
Nitrite: NEGATIVE
Urobilinogen, UA: 0.2 mg/dL (ref 0.0–1.0)
pH: 6 (ref 5.0–8.0)

## 2012-02-24 LAB — CBC
HCT: 26.5 % — ABNORMAL LOW (ref 36.0–46.0)
Hemoglobin: 8.8 g/dL — ABNORMAL LOW (ref 12.0–15.0)
MCH: 30.1 pg (ref 26.0–34.0)
MCHC: 33.2 g/dL (ref 30.0–36.0)
MCV: 90.8 fL (ref 78.0–100.0)
Platelets: 138 10*3/uL — ABNORMAL LOW (ref 150–400)
RBC: 2.92 MIL/uL — ABNORMAL LOW (ref 3.87–5.11)
RDW: 16.5 % — ABNORMAL HIGH (ref 11.5–15.5)
WBC: 8.1 10*3/uL (ref 4.0–10.5)

## 2012-02-24 LAB — MAGNESIUM: Magnesium: 1 mg/dL — ABNORMAL LOW (ref 1.5–2.5)

## 2012-02-24 MED ORDER — MAGNESIUM SULFATE 50 % IJ SOLN
3.0000 g | Freq: Once | INTRAVENOUS | Status: AC
  Administered 2012-02-24: 3 g via INTRAVENOUS
  Filled 2012-02-24: qty 6

## 2012-02-24 MED ORDER — METOPROLOL SUCCINATE ER 50 MG PO TB24
50.0000 mg | ORAL_TABLET | Freq: Once | ORAL | Status: AC
  Administered 2012-02-24: 50 mg via ORAL
  Filled 2012-02-24: qty 1

## 2012-02-24 MED ORDER — VANCOMYCIN HCL 10 G IV SOLR
1250.0000 mg | Freq: Two times a day (BID) | INTRAVENOUS | Status: DC
  Administered 2012-02-25 – 2012-02-27 (×5): 1250 mg via INTRAVENOUS
  Filled 2012-02-24 (×5): qty 1250

## 2012-02-24 MED ORDER — METOPROLOL SUCCINATE ER 50 MG PO TB24
150.0000 mg | ORAL_TABLET | Freq: Every day | ORAL | Status: DC
  Administered 2012-02-25: 150 mg via ORAL
  Filled 2012-02-24: qty 1

## 2012-02-24 MED ORDER — POTASSIUM CHLORIDE CRYS ER 20 MEQ PO TBCR
40.0000 meq | EXTENDED_RELEASE_TABLET | Freq: Once | ORAL | Status: AC
  Administered 2012-02-24: 40 meq via ORAL
  Filled 2012-02-24: qty 2

## 2012-02-24 MED ORDER — WARFARIN SODIUM 6 MG PO TABS
6.0000 mg | ORAL_TABLET | Freq: Once | ORAL | Status: AC
  Administered 2012-02-24: 6 mg via ORAL
  Filled 2012-02-24: qty 1

## 2012-02-24 NOTE — Consult Note (Signed)
NAMETRICIA, PLEDGER NO.:  [id number]  MEDICAL RECORD NO.:  [id number]  LOCATION:  1436                         FACILITY:  [facility]  PHYSICIAN:  Barbaraann Share, MD,FCCPDATE OF BIRTH:  07[birth year]17[birth year]53  DATE OF CONSULTATION:  02/23/2012 DATE OF DISCHARGE:                                CONSULTATION   HISTORY OF PRESENT ILLNESS:  The patient is a very pleasant 57 year old female who I have been asked to see for an abnormal CT of chest as well as worsening dyspnea on exertion.  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  She most recently had a round of chemotherapy the beginning of this month.  She has a history of chronic dyspnea on exertion, but approximately 2 weeks ago, began to develop shortness of breath to the point that she could not walk through her house without becoming very winded.  She also have had a cough with only scant mucus that was occasionally discolored.  She did have chest congestion.  She has been admitted to the hospital where she was noted to have some low-grade fever, but has not had neutropenia.  She underwent a CT scan of her chest, which showed no pulmonary embolus; however, it did show progressive interstitial changes in the lung bases with very mild bronchiectasis and a fibrotic process in the upper lobes as well.  There is also significant patchy ground-glass opacity.  When compared to the CT scan from August of last year, it appeared much worse.  The patient also has a history of calcified lymphadenopathy of unknown origin.  The patient was admitted to the hospital and placed on vancomycin and Zosyn, and has only seen minimal improvement.  She has continued to have intermittent low-grade fever, and continues to cough along with her dyspnea.  The patient has had an echocardiogram in July of last year that was normal.  She has another one that has been ordered.   The patient denies any significant exposure history, nor does she have any history of an autoimmune disease.  PAST MEDICAL HISTORY:  Significant for: 1. Hypertension. 2. History of diverticular disease. 3. History of colonic polyps. 4. History of reflux. 5. History of the jugular deep venous thrombosis. 6. History of breast and fallopian tube cancer as stated above.  SOCIAL HISTORY:  She has a minimal smoking history, and has not done so for about 5 years.  She does not drink alcohol or use illicit drugs.  FAMILY HISTORY:  Remarkable for cancer, hypertension, diabetes, heart disease, stroke.  The patient has an allergy to lisinopril, codeine, oxycodone, and Vicodin.  REVIEW OF SYSTEMS:  A 10-point review of systems was unremarkable except for that mentioned in the history of present illness.  PHYSICAL EXAMINATION:  GENERAL:  She is a morbidly obese female in no acute distress. VITAL SIGNS:  Blood pressure is 144/83, pulse is 110, respiratory rate is 18.  She is afebrile currently, O2 sat on 3 liters is 94-100% throughout the day. HEENT:  Pupils are equal, round, and reactive to light and accommodation.  Extraocular muscles are intact.  Nares are patent without discharge.  Oropharynx is clear. NECK:  Supple without JVD or lymphadenopathy. CHEST:  Reveals very faint crackles in the bases, but no wheezes or rhonchi.  There appeared to be adequate airflow. CARDIAC:  Reveals a mildly tachycardic, but regular rhythm, no murmurs, rubs, or gallops. ABDOMEN:  Soft, nontender with good bowel sounds. GENITAL:  Not done and not indicated. RECTAL:  Not done and not indicated. BREAST:  Not done and not indicated. EXTREMITIES:  Lower extremities with mild edema, no cyanosis. NEUROLOGIC:  She is alert and oriented, and moves all four extremities.  IMPRESSION:  History of mild interstitial disease, which has clearly worsened on her followup CT, as well as the presence of  ground-glass opacities.  It is really unclear whether this is related to her past chemotherapy, but the ground-glass opacities would indicate she has an active superimposed process.  This could represent an infection, overload, and also an inflammatory process.  At this point, I would continue her on her antibiotics for possible infection, and I would also give her a short trial of diuresis.  We will also check some autoimmune labs, and may have to consider empiric treatment with steroids to see if she will improve.  We will follow up her blood work on Monday, and see her again at that time.  Please call if she has worsening of her symptoms prior to the first of the week.     Barbaraann Share, MD,FCCP     [facility]  D:  02/23/2012  T:  02/24/2012  Job:  [id number]

## 2012-02-24 NOTE — Progress Notes (Signed)
ANTIBIOTIC CONSULT NOTE - F/U  Pharmacy Consult for Vancomycin Indication: pneumonia  Allergies  Allergen Reactions  . Lisinopril Cough  . Codeine Itching  . Oxycodone Nausea Only  . Vicodin (Hydrocodone-Acetaminophen) Nausea Only    Patient Measurements: Height: 5' (152.4 cm) Weight: 227 lb 15.3 oz (103.4 kg) IBW/kg (Calculated) : 45.5  Adjusted Body Weight:   Vital Signs: Temp: 101.8 F (38.8 C) (01/19 2037) Temp src: Oral (01/19 2037) BP: 146/93 mmHg (01/19 2037) Pulse Rate: 120  (01/19 2037) Intake/Output from previous day: 01/18 0701 - 01/19 0700 In: 1030 [P.O.:480; IV Piggyback:550] Out: 2650 [Urine:2650] Intake/Output from this shift: Total I/O In: 200 [IV Piggyback:200] Out: -   Labs:  Basename 02/24/12 0325 02/23/12 0445 02/22/12 0620  WBC 8.1 7.7 8.7  HGB 8.8* 8.7* 9.4*  PLT 138* 152 180  LABCREA -- -- --  CREATININE 0.80 0.76 0.75   Estimated Creatinine Clearance: 83.1 ml/min (by C-G formula based on Cr of 0.8).  Basename 02/24/12 2115  VANCOTROUGH 13.7  VANCOPEAK --  VANCORANDOM --  GENTTROUGH --  GENTPEAK --  GENTRANDOM --  TOBRATROUGH --  TOBRAPEAK --  TOBRARND --  AMIKACINPEAK --  AMIKACINTROU --  AMIKACIN --     Microbiology: Recent Results (from the past 720 hour(s))  CULTURE, BLOOD (ROUTINE X 2)     Status: Normal (Preliminary result)   Collection Time   02/19/12 11:36 PM      Component Value Range Status Comment   Specimen Description BLOOD LEFT HAND   Final    Special Requests BOTTLES DRAWN AEROBIC AND ANAEROBIC 5CC   Final    Culture  Setup Time 02/20/2012 06:16   Final    Culture     Final    Value:        BLOOD CULTURE RECEIVED NO GROWTH TO DATE CULTURE WILL BE HELD FOR 5 DAYS BEFORE ISSUING A FINAL NEGATIVE REPORT   Report Status PENDING   Incomplete   CULTURE, BLOOD (ROUTINE X 2)     Status: Normal (Preliminary result)   Collection Time   02/19/12 11:36 PM      Component Value Range Status Comment   Specimen  Description BLOOD LEFT ANTECUBITAL   Final    Special Requests BOTTLES DRAWN AEROBIC AND ANAEROBIC 5CC   Final    Culture  Setup Time 02/20/2012 06:16   Final    Culture     Final    Value:        BLOOD CULTURE RECEIVED NO GROWTH TO DATE CULTURE WILL BE HELD FOR 5 DAYS BEFORE ISSUING A FINAL NEGATIVE REPORT   Report Status PENDING   Incomplete   CULTURE, BLOOD (ROUTINE X 2)     Status: Normal (Preliminary result)   Collection Time   02/23/12  9:05 AM      Component Value Range Status Comment   Specimen Description BLOOD LEFT ARM   Final    Special Requests BOTTLES DRAWN AEROBIC AND ANAEROBIC 5CC   Final    Culture  Setup Time 02/23/2012 15:06   Final    Culture     Final    Value:        BLOOD CULTURE RECEIVED NO GROWTH TO DATE CULTURE WILL BE HELD FOR 5 DAYS BEFORE ISSUING A FINAL NEGATIVE REPORT   Report Status PENDING   Incomplete   CULTURE, BLOOD (ROUTINE X 2)     Status: Normal (Preliminary result)   Collection Time   02/23/12  9:11 AM  Component Value Range Status Comment   Specimen Description BLOOD RIGHT ARM   Final    Special Requests BOTTLES DRAWN AEROBIC AND ANAEROBIC 4CC   Final    Culture  Setup Time 02/23/2012 15:06   Final    Culture     Final    Value:        BLOOD CULTURE RECEIVED NO GROWTH TO DATE CULTURE WILL BE HELD FOR 5 DAYS BEFORE ISSUING A FINAL NEGATIVE REPORT   Report Status PENDING   Incomplete     Medical History: Past Medical History  Diagnosis Date  . Hypertension   . Obesity   . Diverticulitis   . Hemorrhoids   . Colonic polyp   . GERD (gastroesophageal reflux disease)   . Cancer     BREAST, FALLOPIAN TUBE,  . Blood clot of neck vein     Medications:  Levaquin 1/14-->1/17  Assessment: 54 yoF previously on levaquin for PNA, 1/17  Vancomycin and Zosyn for broadened coverage due to continued SOB and new fever.    Renal fxn stable, CrCl 84 (N87)  WBC WNL  1/14 blood cultures: NGTD  Weight >100kg (105kg)   VT= 13.7 mcg/ml slightly  below goal.  Goal of Therapy:  Vancomycin trough level 15-20 mcg/ml  Plan:  1.  Increase Vancomycin to 1250mg  IV q12h. 3.  F/u vancomycin trough at steady state, renal fxn, CBC, T, cultures, clinical course      Lorenza Evangelist 02/24/2012,10:39 PM

## 2012-02-24 NOTE — Progress Notes (Signed)
ANTICOAGULATION CONSULT NOTE - Follow Up Consult  Pharmacy Consult for Coumadin Indication: Right subclavian DVT  Allergies  Allergen Reactions  . Lisinopril Cough  . Codeine Itching  . Oxycodone Nausea Only  . 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 Admitted with SOB.  Patient reportedly on Coumadin 7.5mg  po daily.    Dose on 1/16 not charted as given.    INR therapeutic today.    Hgb low but stable, no documentation of bleeding. No DDI.Stable renal function.   Goal of Therapy:  INR 2-3 Monitor platelets by anticoagulation protocol: Yes   Plan:   Coumadin 6 mg po x 1 tonight  Daily PT/INR  Pharmacy will f/u  Geoffry Paradise, PharmD, BCPS Pager: [phone] 1:00 PM Pharmacy #: [id number]

## 2012-02-24 NOTE — Progress Notes (Signed)
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 Patient has been having dyspnea for last 2-3 months and has been following with pulmonologist and cardiologist. Patient also has been recently placed on Augmentin by her primary care physician for the same reason. Patient states her shortness of breath had worsened and was instructed to come to the ER. Since patient is short of breath and tachycardic CT angiogram of the chest was done which was negative for pulmonary embolism but did show progression of fibrosis.    Assessment/Plan: Shortness of breath Etiology unclear. Spiked another temperature 2  Nights ago. No fever over night. Antibiotics were broadened to Vancomycin and Zosyn on 02/22/2012, as patient is on chemo. WBC normal. Influenza panel negative.  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 Is not on O2 at home, will need desaturation study prior to discharge to determine if the patient needs oxygen at home. Has seen Dr. Marchelle Gearing in the past. Pro-BNP normal on admission. Patient has been seen in consultation by Dr Shelle Iron of pulmonary who recommended a dose of diuretics, continued antibiotics, autoimmune studies and possible consideration of steriods to be determined by pulmonary this week. Continue PRN neb treatments.   Progressive interstitial lung disease Per CT chest. Patient has been seen in consultation by Dr. Shelle Iron of pulmonary who felt CT findings could represent an infection, overload and also an inflammatory process superimposed on her interstitial disease. Autoimmune labs have been ordered. Patient was given a dose of IV Lasix per pulmonary. Was recommended to  continue empiric IV antibiotic treatment. To be determined per pulmonary later on as to whether patient may need a trial of empiric steroids. Pulmonary following and appreciate input and recommendations.   Fever Unclear etiology. Antibiotics as indicated above. CT angio negative for PE on 02/19/2012, progressive pulmonary parenchymal disease.  Hypokalemia/hypomagnesemia Replete.  Sinus tachycardia Patient has been worked up for this as outpatient with cardiologist and no specific cause has been found. 2-D echo done in July 2013 was showing EF of 60-65%. Increase metoprolol to 150mg  daily. Thyroid function tests normal on 02/20/2012. No events on telemetry, CTA negative.  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   Right subclavian DVT Coumadin per pharmacy.  Code Status: Full Family Communication: Patient at bedside Disposition Plan: Pending improvement in fevers.   Consultants:  Pulmonary: Dr Shelle Iron 02/23/12  Procedures:  None  Antibiotics:  Levaquin 1/14 >> 1/17  Vancomycin 1/17 >>  Zosyn 1/17 >>  HPI/Subjective: No fever overnight. Shortness of breath with exertion. Patient states cough less frequent and feels SOB maybe improving since admission.  Objective: Filed Vitals:   02/23/12 1517 02/23/12 2141 02/24/12 0535 02/24/12 1020  BP: 144/83 123/76 128/86 128/81  Pulse: 123 120 114 112  Temp: 99.4 F (37.4 C) 98.9 F (37.2 C) 99 F (37.2 C)   TempSrc: Oral Oral Oral   Resp:  18 18   Height:      Weight:   103.4 kg (227 lb 15.3 oz)   SpO2: 94% 97% 95%     Intake/Output Summary (Last 24 hours) at 02/24/12 1100 Last data filed at 02/24/12 0840  Gross per 24 hour  Intake    550 ml  Output   3050 ml  Net  -2500 ml   Filed Weights   02/22/12 0627 02/23/12 0611 02/24/12 0535  Weight: 105.5 kg (232 lb 9.4 oz) 104.9 kg (231 lb 4.2 oz) 103.4 kg (227 lb 15.3 oz)    Exam: Physical Exam: General: Awake, Oriented, No acute distress. HEENT:  EOMI. Neck: Supple CV: S1 and S2 Lungs: Good air movement, crackles at bases bilaterally, worse on the right then on left.  Abdomen: Soft, Nontender, Nondistended, +bowel sounds. Ext: Good pulses. Trace edema.  Data Reviewed: Basic Metabolic Panel:  Lab 02/24/12 4098 02/23/12 0445 02/22/12 0620 02/20/12 0440 02/19/12 1545  NA 129* 128* 133* 132* 131*  K 3.0* 3.3* 3.6 4.4 4.1  CL 89* 91* 97 95* 92*  CO2 32 29 28 27 27   GLUCOSE 112* 146* 103* 221* 85  BUN 11 11 17 12 12   CREATININE 0.80 0.76 0.75 0.73 0.71  CALCIUM 8.4 8.5 8.9 9.3 9.3  MG 1.0* -- -- -- --  PHOS -- -- -- -- --   Liver Function Tests:  Lab 02/19/12 1545  AST 28  ALT 16  ALKPHOS 99  BILITOT 0.4  PROT 6.7  ALBUMIN 3.0*   No results found for this basename: LIPASE:5,AMYLASE:5 in the last 168 hours No results found for this basename: AMMONIA:5 in the last 168 hours CBC:  Lab 02/24/12 0325 02/23/12 0445 02/22/12 0620 02/20/12 0440 02/19/12 1545  WBC 8.1 7.7 8.7 6.7 7.8  NEUTROABS -- -- -- -- 5.8  HGB 8.8* 8.7* 9.4* 10.0* 10.4*  HCT 26.5* 26.9* 28.3* 31.2* 31.7*  MCV 90.8 91.8 93.4 93.1 91.9  PLT 138* 152 180 189 207   Cardiac Enzymes:  Lab 02/19/12 1545  CKTOTAL --  CKMB --  CKMBINDEX --  TROPONINI <0.30   BNP (last 3 results)  Basename 02/19/12 1545 08/14/11 1522  PROBNP 21.7 18.0   CBG: No results found for this basename: GLUCAP:5 in the last 168 hours  Recent Results (from the past 240 hour(s))  CULTURE, BLOOD (ROUTINE X 2)     Status: Normal (Preliminary result)   Collection Time   02/19/12 11:36 PM      Component Value Range Status Comment   Specimen Description BLOOD LEFT HAND   Final    Special Requests BOTTLES DRAWN AEROBIC AND ANAEROBIC 5CC   Final    Culture  Setup Time 02/20/2012 06:16   Final    Culture     Final    Value:        BLOOD CULTURE RECEIVED NO GROWTH TO DATE CULTURE WILL BE HELD FOR 5 DAYS BEFORE ISSUING A FINAL NEGATIVE REPORT   Report Status PENDING   Incomplete     CULTURE, BLOOD (ROUTINE X 2)     Status: Normal (Preliminary result)   Collection Time   02/19/12 11:36 PM      Component Value Range Status Comment   Specimen Description BLOOD LEFT ANTECUBITAL   Final    Special Requests BOTTLES DRAWN AEROBIC AND ANAEROBIC 5CC   Final    Culture  Setup Time 02/20/2012 06:16   Final    Culture     Final    Value:        BLOOD CULTURE RECEIVED NO GROWTH TO DATE CULTURE WILL BE HELD FOR 5 DAYS BEFORE ISSUING A FINAL NEGATIVE REPORT   Report Status PENDING   Incomplete      Studies: No results found.  Scheduled Meds:    . amLODipine  5 mg Oral Daily  .  cholecalciferol  3,000 Units Oral Daily  . magnesium sulfate 1 - 4 g bolus IVPB  3 g Intravenous Once  . metoprolol succinate  100 mg Oral Daily  . piperacillin-tazobactam (ZOSYN)  IV  3.375 g Intravenous Q8H  . potassium chloride SA  20 mEq Oral Daily  . sodium chloride  3 mL Intravenous Q12H  . vancomycin  1,000 mg Intravenous Q12H  . Warfarin - Pharmacist Dosing Inpatient   Does not apply q1800   Continuous Infusions:   Principal Problem:  *SOB (shortness of breath) Active Problems:  Fallopian tube cancer, BRCA2 positive  Hyponatremia  Anemia  Sinus tachycardia  ILD (interstitial lung disease)  DVT (deep venous thrombosis)  Hypomagnesemia  Hypokalemia   [facility]  [facility] Pager [phone]. If 7PM-7AM, please contact night-coverage at [email or web address], password [facility] 02/24/2012, 11:00 AM  LOS: 5 days

## 2012-02-24 NOTE — Progress Notes (Signed)
Patient oxygen saturation on RA at rest down to 70%, applied oxygen back on 2L, 92%-2L. Dr. Janee Morn notified. Will continue to assess patient.

## 2012-02-25 ENCOUNTER — Inpatient Hospital Stay (HOSPITAL_COMMUNITY): Payer: Medicare Other

## 2012-02-25 LAB — CBC
HCT: 26.2 % — ABNORMAL LOW (ref 36.0–46.0)
Hemoglobin: 8.7 g/dL — ABNORMAL LOW (ref 12.0–15.0)
MCHC: 33.2 g/dL (ref 30.0–36.0)
MCV: 91 fL (ref 78.0–100.0)
RDW: 16.5 % — ABNORMAL HIGH (ref 11.5–15.5)

## 2012-02-25 LAB — EXTRACTABLE NUCLEAR ANTIGEN ANTIBODY
ENA SM Ab Ser-aCnc: <1 AU/mL (ref <30)
SSB (La) (ENA) Antibody, IgG: <1 AU/mL (ref <30)
Scleroderma (Scl-70) (ENA) Antibody, IgG: 1 AU/mL (ref <30)
Sm/rnp: <1 AU/mL (ref <30)
ds DNA Ab: 1 IU/mL (ref <30)

## 2012-02-25 LAB — BASIC METABOLIC PANEL
BUN: 10 mg/dL (ref 6–23)
Creatinine, Ser: 0.84 mg/dL (ref 0.50–1.10)
GFR calc Af Amer: 87 mL/min — ABNORMAL LOW (ref >90)
GFR calc non Af Amer: 75 mL/min — ABNORMAL LOW (ref >90)
Glucose, Bld: 127 mg/dL — ABNORMAL HIGH (ref 70–99)
Potassium: 3.5 mEq/L (ref 3.5–5.1)

## 2012-02-25 LAB — URINE CULTURE

## 2012-02-25 MED ORDER — POTASSIUM CHLORIDE CRYS ER 20 MEQ PO TBCR
40.0000 meq | EXTENDED_RELEASE_TABLET | Freq: Once | ORAL | Status: AC
  Administered 2012-02-25: 40 meq via ORAL
  Filled 2012-02-25: qty 2

## 2012-02-25 MED ORDER — WARFARIN SODIUM 6 MG PO TABS
6.0000 mg | ORAL_TABLET | Freq: Once | ORAL | Status: AC
  Administered 2012-02-25: 6 mg via ORAL
  Filled 2012-02-25: qty 1

## 2012-02-25 MED ORDER — METOPROLOL SUCCINATE ER 50 MG PO TB24
50.0000 mg | ORAL_TABLET | Freq: Once | ORAL | Status: AC
  Administered 2012-02-25: 50 mg via ORAL
  Filled 2012-02-25: qty 1

## 2012-02-25 MED ORDER — METOPROLOL SUCCINATE ER 100 MG PO TB24
200.0000 mg | ORAL_TABLET | Freq: Every day | ORAL | Status: DC
  Administered 2012-02-26 – 2012-03-05 (×9): 200 mg via ORAL
  Filled 2012-02-25 (×9): qty 2

## 2012-02-25 MED ORDER — MAGNESIUM SULFATE 40 MG/ML IJ SOLN
2.0000 g | Freq: Once | INTRAMUSCULAR | Status: AC
  Administered 2012-02-25: 2 g via INTRAVENOUS
  Filled 2012-02-25: qty 50

## 2012-02-25 NOTE — Progress Notes (Signed)
ANTICOAGULATION CONSULT NOTE - Follow Up  Pharmacy Consult for Coumadin Indication: Right Subclavian DVT  Allergies  Allergen Reactions  . Lisinopril Cough  . Codeine Itching  . Oxycodone Nausea Only  . 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 Admitted 1/14 with SOB.  Patient reportedly on Coumadin 7.5mg  po daily.    Dose on 1/16 not charted as given.    INR therapeutic today, but rising rapidly (possibly from larger dose of 12/5mg  given 1/17). Will give lower-than-usual-home dose tonight to slow INR progression.  Hgb low but stable, no documentation of bleeding. No DDI. Stable renal function.   Goal of Therapy:  INR 2-3 Monitor platelets by anticoagulation protocol: Yes   Plan:   Repeat Coumadin 6 mg po x 1 tonight  Daily PT/INR  Pharmacy will f/u  Darrol Angel, PharmD Pager: [phone] 02/25/2012 8:45 AM

## 2012-02-25 NOTE — Evaluation (Signed)
Physical Therapy Evaluation Patient Details Name: Abigail [name] MRN: [id number] DOB: [birth year] Today's Date: 02/25/2012 Time: 4098-1191 PT Time Calculation (min): 22 min  PT Assessment / Plan / Recommendation Clinical Impression  Pt presents with SOB, lung fibrosis and suspected PNA.  Upon PT arrival to room, noted that pts nose was bleeding profusely.  RN notified.  Also noted that pt was extremely SOB, taking short gasps of breath.  SaO2 was reading 68% per pulseox.  RN notified.  Reapplied 4L O2 (had oxygen off due to nose bleed).  O2 only able to rise to 78%.  Reapplied venti  mask at 15L with O2 sats getting to upper 80's.  RN aware.  She was able to take a couple of steps from bed to chair, however deferred further ambulation.  Pt will benefit from skilled PT in acute venue to address deficits.  PT recommends HHPT vs ST SNF pending pt progress while in hospital.      PT Assessment  Patient needs continued PT services    Follow Up Recommendations  Home health PT;SNF (if HHPT, then [facility] aide)    Does the patient have the potential to tolerate intense rehabilitation      Barriers to Discharge Decreased caregiver support      Equipment Recommendations  Other (comment) (TBD)    Recommendations for Other Services     Frequency Min 3X/week    Precautions / Restrictions Precautions Precautions: Fall Precaution Comments: Monitor O2, desats very low Restrictions Weight Bearing Restrictions: No   Pertinent Vitals/Pain No pain      Mobility  Bed Mobility Bed Mobility: Not assessed Details for Bed Mobility Assistance: Pt on side of bed when PT arrived.  Transfers Transfers: Sit to Stand;Stand to [employer] Transfers Sit to Stand: 4: Min guard;With upper extremity assist;From bed Stand to Sit: 4: Min guard;With upper extremity assist;With armrests;To chair/3-in-1 Stand Pivot Transfers: 4: Min assist Details for Transfer Assistance: cues for hand placement and safety.   Min/gaurd assist to steady. Had pt take some steps from bed to chair with cues for safety.  did not use AD for short distance.   Ambulation/Gait Ambulation/Gait Assistance: Not tested (comment) Ambulation/Gait Assistance Details: Deferred ambulation due to pt was SOB upon PT arrival with decreased O2 sats.  Gait Pattern: Step-to pattern;Decreased stride length;Trunk flexed Stairs: No Wheelchair Mobility Wheelchair Mobility: No    Shoulder Instructions     Exercises     PT Diagnosis: Difficulty walking;Generalized weakness  PT Problem List: Decreased strength;Decreased activity tolerance;Decreased balance;Decreased mobility;Decreased knowledge of use of DME;Cardiopulmonary status limiting activity;Obesity PT Treatment Interventions: DME instruction;Gait training;Stair training;Functional mobility training;Therapeutic activities;Therapeutic exercise;Balance training;Patient/family education   PT Goals Acute Rehab PT Goals PT Goal Formulation: With patient Time For Goal Achievement: 03/10/12 Potential to Achieve Goals: Fair Pt will go Supine/Side to Sit: with supervision PT Goal: Supine/Side to Sit - Progress: Goal set today Pt will go Sit to Supine/Side: with supervision PT Goal: Sit to Supine/Side - Progress: Goal set today Pt will go Sit to Stand: with supervision PT Goal: Sit to Stand - Progress: Goal set today Pt will go Stand to Sit: with supervision PT Goal: Stand to Sit - Progress: Goal set today Pt will Ambulate: 51 - 150 feet;with supervision;with least restrictive assistive device PT Goal: Ambulate - Progress: Goal set today Pt will Go Up / Down Stairs: 1-2 stairs;with supervision;with least restrictive assistive device PT Goal: Up/Down Stairs - Progress: Goal set today  Visit Information  Last PT Received  On: 02/25/12 Assistance Needed: +1    Subjective Data  Subjective: Why does my nose keep bleeding? Patient Stated Goal: to return home.    Prior Functioning   Home Living Lives With: Alone Available Help at Discharge: Personal care attendant;Other (Comment) (wants to arrange for [facility] aide) Type of Home: Apartment Home Access: Stairs to enter Entrance Stairs-Number of Steps: 2 Home Layout: One level Bathroom Shower/Tub: Engineer, manufacturing systems: Standard Home Adaptive Equipment: Straight cane Prior Function Level of Independence: Independent with assistive device(s) Able to Take Stairs?: Yes Driving: Yes Communication Communication: No difficulties    Cognition  Overall Cognitive Status: Appears within functional limits for tasks assessed/performed Arousal/Alertness: Awake/alert Orientation Level: Appears intact for tasks assessed Behavior During Session: [facility] for tasks performed    Extremity/Trunk Assessment Left Upper Extremity Assessment LUE ROM/Strength/Tone: [facility] for tasks assessed Right Lower Extremity Assessment RLE ROM/Strength/Tone: [facility] for tasks assessed Trunk Assessment Trunk Assessment: Kyphotic   Balance    End of Session PT - End of Session Equipment Utilized During Treatment: Gait belt Activity Tolerance: Patient limited by fatigue;Treatment limited secondary to medical complications (Comment) Patient left: in chair;with call bell/phone within reach Nurse Communication: Mobility status;Other (comment) (O2 sats. )  GP     Vista Deck 02/25/2012, 12:24 PM

## 2012-02-25 NOTE — Progress Notes (Signed)
Report received from Tara Dark, RN. No change from initial am assessment. Will continue to follow the plan of care. 

## 2012-02-25 NOTE — Progress Notes (Addendum)
NAME:  Abigail [name], Abigail [name] NO.:  [id number]    HISTORY OF PRESENT ILLNESS:  The patient is a very pleasant 57 year old female who PCCM has been asked to see for an abnormal CT of chest as well as worsening dyspnea on exertion. 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  She most recently had a round of chemotherapy the beginning of this month.    She is known tohave non specific ILD findings 2009-> 2013 that were stable and  with calcified hilar nodes. In June 2013 when last seen by pccm in office she had dyspnea out of proportion to CT findings. Dyspnea felt due to be deconditoning, tachycardia (ultimate cardiac wokrup negative), obesity as prime factors. Summer 2013 PE was also ruled out. AFter this she did not fu with PCCM; a PFT had been scheduled   PHYSICAL EXAMINATION:   BP 124/66  Pulse 116  Temp 98.9 F (37.2 C) (Oral)  Resp 20  Ht 5' (1.524 m)  Wt 102.3 kg (225 lb 8.5 oz)  BMI 44.05 kg/m2  SpO2 93%  Intake/Output Summary (Last 24 hours) at 02/25/12 1131 Last data filed at 02/25/12 0630  Gross per 24 hour  Intake    780 ml  Output    800 ml  Net    -20 ml  now on 100% NRB.   HEENT:  Pupils are equal, round, and reactive to light.  Extraocular muscles are intact.  Nares are patent without discharge.  Oropharynx is clear. CHEST:  Crackles in bases.  CARDIAC:  Reveals a mildly tachycardic, but regular rhythm, no murmurs, rubs, or gallops. ABDOMEN:  Soft, nontender with good bowel sounds. EXTREMITIES:  Lower extremities with mild edema, no cyanosis. NEUROLOGIC:  She is alert and oriented, and moves all four extremities.  Lab 02/25/12 0300 02/24/12 0325 02/23/12 0445  NA 131* 129* 128*  K 3.5 3.0* 3.3*  CL 91* 89* 91*  CO2 31 32 29  BUN 10 11 11   CREATININE 0.84 0.80 0.76  GLUCOSE 127* 112* 146*    Lab 02/25/12 0300 02/24/12 0325 02/23/12 0445  HGB 8.7* 8.8* 8.7*  HCT 26.2*  26.5* 26.9*  WBC 7.3 8.1 7.7  PLT 155 138* 152    Erythrocyte Sedimentation Rate     Component Value Date/Time   ESRSEDRATE 90* 02/23/2012 1749  ANA: neg RF: neg Influenza pcr: neg   PCXR: marked progression of bilateral airspace disease, specifically worsening in RUL  IMPRESSION:   Progressive acute respiratory failure in the setting of worsening pulmonary infiltrates. ? Chemo related pneumonitis ? Volume overload: seems doubtful  ? Infectious vs auto-immune: autoimmune neg to date. Still has low grade fever  Plan: Cont antibiotics for possible infection Diurese as tol Will trial empiric treatment with steroids to see if she will improve.    Mild hyponatremia  Lab 02/26/12 0459 02/25/12 0300 02/24/12 0325  NA 126* 131* 129*  Plan F/u urine studies  F/u osmo  F/u chem s/p lasix   Fallopian tube carcinoma Plan Per heme/onc  Anemia   Lab 02/26/12 0459 02/25/12 0300 02/24/12 0325  HGB 8.8* 8.7* 8.8*  No evidence of bleeding Plan Trend cbc  DVT (right [place] vein) Lab Results  Component Value Date   INR 2.87* 02/26/2012   INR 2.71* 02/25/2012   INR 2.32* 02/24/2012   PROTIME 21.6* 02/07/2012   PROTIME  24.0* 01/11/2012   PROTIME 25.2* 12/14/2011  Plan Coumadin      STAFF NOTE: I, Dr Lavinia Sharps have personally reviewed patient's available data, including medical history, events of note, physical examination and test results as part of my evaluation. I have discussed with resident/NP and other care providers such as pharmacist, RN and RRT.  In addition,  I personally evaluated patient and elicited key findings of acute hypoxemic resp failure. Has chronic ILD NOS. Currently autoimmune workup negative. Since admit 02/19/2012; took sudden decpmpensation with ALI findings on 01/05/13. Moving to ICU for bipap. DDx is HCAP, DIastolic CHF exacerbation and evolving ARDS NOS. Will start bipap. Check more autioimmune panel. Too high risk for safe bronch. IF gets intubated will bronch.  Can consider empiric steroids for possible BOOP.  Rest per NP/medical resident whose note is outlined above and that I agree with  The patient is critically ill with multiple organ systems failure and requires high complexity decision making for assessment and support, frequent evaluation and titration of therapies, application of advanced monitoring technologies and extensive interpretation of multiple databases.   Critical Care Time devoted to patient care services described in this note is  45  Minutes independent of NP time  Dr. Kalman Shan, M.D., [facility].C.P Pulmonary and Critical Care Medicine Staff Physician [facility] [place] [facility] Pager: [phone], If no answer or between  15:00h - 7:00h: call [phone]  02/26/2012 12:45 PM

## 2012-02-25 NOTE — Progress Notes (Signed)
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 Patient has been having dyspnea for last 2-3 months and has been following with pulmonologist and cardiologist. Patient also has been recently placed on Augmentin by her primary care physician for the same reason. Patient states her shortness of breath had worsened and was instructed to come to the ER. Since patient is short of breath and tachycardic CT angiogram of the chest was done which was negative for pulmonary embolism but did show progression of fibrosis.    Assessment/Plan: Shortness of breath Etiology unclear. Spiked another temperature last night. Antibiotics were broadened to Vancomycin and Zosyn on 02/22/2012, as patient is on chemo. WBC normal. Influenza panel negative.  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 Is not on O2 at home, will need desaturation study prior to discharge to determine if the patient needs oxygen at home. Has seen Dr. Marchelle Gearing in the past. Pro-BNP normal on admission. Patient has been seen in consultation by Dr Shelle Iron of pulmonary who recommended a dose of diuretics, continued antibiotics, autoimmune studies and possible consideration of steriods to be determined by pulmonary this week. Continue PRN neb treatments.   Progressive interstitial lung disease Per CT chest. Patient has been seen in consultation by Dr. Shelle Iron of pulmonary who felt CT findings could represent an infection, overload and also an inflammatory process superimposed on her interstitial disease. Autoimmune labs have been ordered. Patient was given a dose of IV Lasix per pulmonary yesterday. Was recommended to continue  empiric IV antibiotic treatment. To be determined per pulmonary later on as to whether patient may need a trial of empiric steroids. Pulmonary following and appreciate input and recommendations.   Fever Unclear etiology. Antibiotics as indicated above. CT angio negative for PE on 02/19/2012, progressive pulmonary parenchymal disease.?? Pneumonitis. Will defer to pulmonary  Hypokalemia/hypomagnesemia Replete.  Sinus tachycardia Patient has been worked up for this as outpatient with cardiologist and no specific cause has been found. 2-D echo done in July 2013 was showing EF of 60-65%. Increase metoprolol to 200mg  daily. Thyroid function tests normal on 02/20/2012. No events on telemetry, CTA negative.  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   Right subclavian DVT Coumadin per pharmacy.  Code Status: Full Family Communication: Patient at bedside Disposition Plan: Pending improvement in fevers.   Consultants:  Pulmonary: Dr Shelle Iron 02/23/12  Procedures:  None  Antibiotics:  Levaquin 1/14 >> 1/17  Vancomycin 1/17 >>  Zosyn 1/17 >>  HPI/Subjective: Patient with fever overnight. Shortness of breath with exertion. Patient with desats into the 70s on room at per nursing.  Objective: Filed Vitals:   02/24/12 2037 02/24/12 2101 02/24/12 2300 02/25/12 0629  BP: 146/93   124/66  Pulse: 120   116  Temp: 101.8 F (38.8 C)  99.1 F (37.3 C) 98.9 F (37.2 C)  TempSrc: Oral  Oral Oral  Resp: 20   20  Height:      Weight:    102.3 kg (225 lb 8.5 oz)  SpO2: 97% 88%  93%    Intake/Output Summary (Last 24 hours) at 02/25/12 1157 Last data filed at 02/25/12 0630  Gross per 24 hour  Intake    780 ml  Output    800  ml  Net    -20 ml   Filed Weights   02/23/12 0611 02/24/12 0535 02/25/12 0629  Weight: 104.9 kg (231 lb 4.2 oz) 103.4 kg (227 lb 15.3 oz) 102.3 kg (225 lb 8.5 oz)    Exam: Physical Exam: General: Awake, Oriented, No acute distress. HEENT:  EOMI. Neck: Supple CV: S1 and S2 Lungs: Good air movement, crackles at bases bilaterally, worse on the right then on left.  Abdomen: Soft, Nontender, Nondistended, +bowel sounds. Ext: Good pulses. Trace edema.  Data Reviewed: Basic Metabolic Panel:  Lab 02/25/12 1610 02/24/12 0325 02/23/12 0445 02/22/12 0620 02/20/12 0440  NA 131* 129* 128* 133* 132*  K 3.5 3.0* 3.3* 3.6 4.4  CL 91* 89* 91* 97 95*  CO2 31 32 29 28 27   GLUCOSE 127* 112* 146* 103* 221*  BUN 10 11 11 17 12   CREATININE 0.84 0.80 0.76 0.75 0.73  CALCIUM 8.7 8.4 8.5 8.9 9.3  MG 1.7 1.0* -- -- --  PHOS -- -- -- -- --   Liver Function Tests:  Lab 02/19/12 1545  AST 28  ALT 16  ALKPHOS 99  BILITOT 0.4  PROT 6.7  ALBUMIN 3.0*   No results found for this basename: LIPASE:5,AMYLASE:5 in the last 168 hours No results found for this basename: AMMONIA:5 in the last 168 hours CBC:  Lab 02/25/12 0300 02/24/12 0325 02/23/12 0445 02/22/12 0620 02/20/12 0440 02/19/12 1545  WBC 7.3 8.1 7.7 8.7 6.7 --  NEUTROABS -- -- -- -- -- 5.8  HGB 8.7* 8.8* 8.7* 9.4* 10.0* --  HCT 26.2* 26.5* 26.9* 28.3* 31.2* --  MCV 91.0 90.8 91.8 93.4 93.1 --  PLT 155 138* 152 180 189 --   Cardiac Enzymes:  Lab 02/19/12 1545  CKTOTAL --  CKMB --  CKMBINDEX --  TROPONINI <0.30   BNP (last 3 results)  Basename 02/19/12 1545 08/14/11 1522  PROBNP 21.7 18.0   CBG: No results found for this basename: GLUCAP:5 in the last 168 hours  Recent Results (from the past 240 hour(s))  CULTURE, BLOOD (ROUTINE X 2)     Status: Normal (Preliminary result)   Collection Time   02/19/12 11:36 PM      Component Value Range Status Comment   Specimen Description BLOOD LEFT HAND   Final    Special Requests BOTTLES DRAWN AEROBIC AND ANAEROBIC 5CC   Final    Culture  Setup Time 02/20/2012 06:16   Final    Culture     Final    Value:        BLOOD CULTURE RECEIVED NO GROWTH TO DATE CULTURE WILL BE HELD FOR 5 DAYS BEFORE ISSUING A FINAL NEGATIVE REPORT    Report Status PENDING   Incomplete   CULTURE, BLOOD (ROUTINE X 2)     Status: Normal (Preliminary result)   Collection Time   02/19/12 11:36 PM      Component Value Range Status Comment   Specimen Description BLOOD LEFT ANTECUBITAL   Final    Special Requests BOTTLES DRAWN AEROBIC AND ANAEROBIC 5CC   Final    Culture  Setup Time 02/20/2012 06:16   Final    Culture     Final    Value:        BLOOD CULTURE RECEIVED NO GROWTH TO DATE CULTURE WILL BE HELD FOR 5 DAYS BEFORE ISSUING A FINAL NEGATIVE REPORT   Report Status PENDING   Incomplete   CULTURE, BLOOD (ROUTINE X 2)     Status: Normal (  Preliminary result)   Collection Time   02/23/12  9:05 AM      Component Value Range Status Comment   Specimen Description BLOOD LEFT ARM   Final    Special Requests BOTTLES DRAWN AEROBIC AND ANAEROBIC 5CC   Final    Culture  Setup Time 02/23/2012 15:06   Final    Culture     Final    Value:        BLOOD CULTURE RECEIVED NO GROWTH TO DATE CULTURE WILL BE HELD FOR 5 DAYS BEFORE ISSUING A FINAL NEGATIVE REPORT   Report Status PENDING   Incomplete   CULTURE, BLOOD (ROUTINE X 2)     Status: Normal (Preliminary result)   Collection Time   02/23/12  9:11 AM      Component Value Range Status Comment   Specimen Description BLOOD RIGHT ARM   Final    Special Requests BOTTLES DRAWN AEROBIC AND ANAEROBIC 4CC   Final    Culture  Setup Time 02/23/2012 15:06   Final    Culture     Final    Value:        BLOOD CULTURE RECEIVED NO GROWTH TO DATE CULTURE WILL BE HELD FOR 5 DAYS BEFORE ISSUING A FINAL NEGATIVE REPORT   Report Status PENDING   Incomplete      Studies: No results found.  Scheduled Meds:    . amLODipine  5 mg Oral Daily  . cholecalciferol  3,000 Units Oral Daily  . metoprolol succinate  150 mg Oral Daily  . piperacillin-tazobactam (ZOSYN)  IV  3.375 g Intravenous Q8H  . potassium chloride SA  20 mEq Oral Daily  . sodium chloride  3 mL Intravenous Q12H  . vancomycin  1,250 mg Intravenous Q12H  .  warfarin  6 mg Oral ONCE-1800  . Warfarin - Pharmacist Dosing Inpatient   Does not apply q1800   Continuous Infusions:   Principal Problem:  *SOB (shortness of breath) Active Problems:  Fallopian tube cancer, BRCA2 positive  Hyponatremia  Anemia  Sinus tachycardia  ILD (interstitial lung disease)  DVT (deep venous thrombosis)  Hypomagnesemia  Hypokalemia   [facility]  [facility] Pager [phone]. If 7PM-7AM, please contact night-coverage at [email or web address], password [facility] 02/25/2012, 11:57 AM  LOS: 6 days

## 2012-02-26 ENCOUNTER — Inpatient Hospital Stay (HOSPITAL_COMMUNITY): Payer: Medicare Other

## 2012-02-26 DIAGNOSIS — J9600 Acute respiratory failure, unspecified whether with hypoxia or hypercapnia: Principal | ICD-10-CM | POA: Diagnosis present

## 2012-02-26 DIAGNOSIS — J9601 Acute respiratory failure with hypoxia: Secondary | ICD-10-CM | POA: Diagnosis present

## 2012-02-26 LAB — CULTURE, BLOOD (ROUTINE X 2)
Culture: NO GROWTH
Culture: NO GROWTH

## 2012-02-26 LAB — BASIC METABOLIC PANEL
CO2: 31 mEq/L (ref 19–32)
Calcium: 8.8 mg/dL (ref 8.4–10.5)
Chloride: 88 mEq/L — ABNORMAL LOW (ref 96–112)
Creatinine, Ser: 0.85 mg/dL (ref 0.50–1.10)
Glucose, Bld: 124 mg/dL — ABNORMAL HIGH (ref 70–99)
Sodium: 126 mEq/L — ABNORMAL LOW (ref 135–145)

## 2012-02-26 LAB — BLOOD GAS, ARTERIAL
Acid-Base Excess: 5.9 mmol/L — ABNORMAL HIGH (ref 0.0–2.0)
Acid-Base Excess: 6.8 mmol/L — ABNORMAL HIGH (ref 0.0–2.0)
Bicarbonate: 31.9 mEq/L — ABNORMAL HIGH (ref 20.0–24.0)
Delivery systems: POSITIVE
Drawn by: 352351
Drawn by: 129801
FIO2: 1 %
FIO2: 1 %
O2 Saturation: 89.5 %
O2 Saturation: 98.8 %
PEEP: 6 cmH2O
Patient temperature: 98.6
Patient temperature: 98.6
Pressure support: 12 cmH2O
TCO2: 29.4 mmol/L (ref 0–100)
pCO2 arterial: 55.9 mmHg — ABNORMAL HIGH (ref 35.0–45.0)
pH, Arterial: 7.375 (ref 7.350–7.450)
pO2, Arterial: 59.2 mmHg — ABNORMAL LOW (ref 80.0–100.0)

## 2012-02-26 LAB — CBC
HCT: 26.8 % — ABNORMAL LOW (ref 36.0–46.0)
MCH: 30 pg (ref 26.0–34.0)
MCV: 91.5 fL (ref 78.0–100.0)
RBC: 2.93 MIL/uL — ABNORMAL LOW (ref 3.87–5.11)
WBC: 7.7 10*3/uL (ref 4.0–10.5)

## 2012-02-26 LAB — IRON AND TIBC: UIBC: 212 ug/dL (ref 125–400)

## 2012-02-26 LAB — PROCALCITONIN: Procalcitonin: 0.18 ng/mL

## 2012-02-26 LAB — PRO B NATRIURETIC PEPTIDE: Pro B Natriuretic peptide (BNP): 258.3 pg/mL — ABNORMAL HIGH (ref 0–125)

## 2012-02-26 LAB — FOLATE: Folate: 6.8 ng/mL

## 2012-02-26 LAB — STREP PNEUMONIAE URINARY ANTIGEN: Strep Pneumo Urinary Antigen: NEGATIVE

## 2012-02-26 LAB — OSMOLALITY: Osmolality: 266 mOsm/kg — ABNORMAL LOW (ref 275–300)

## 2012-02-26 LAB — INFLUENZA PANEL BY PCR (TYPE A & B)
H1N1 flu by pcr: NOT DETECTED
Influenza A By PCR: NEGATIVE

## 2012-02-26 LAB — PROTIME-INR: INR: 2.87 — ABNORMAL HIGH (ref 0.00–1.49)

## 2012-02-26 LAB — OSMOLALITY, URINE: Osmolality, Ur: 276 mOsm/kg — ABNORMAL LOW (ref 390–1090)

## 2012-02-26 LAB — FERRITIN: Ferritin: 622 ng/mL — ABNORMAL HIGH (ref 10–291)

## 2012-02-26 LAB — SODIUM, URINE, RANDOM: Sodium, Ur: 103 mEq/L

## 2012-02-26 MED ORDER — WARFARIN SODIUM 5 MG PO TABS
5.0000 mg | ORAL_TABLET | Freq: Once | ORAL | Status: AC
  Administered 2012-02-26: 5 mg via ORAL
  Filled 2012-02-26: qty 1

## 2012-02-26 MED ORDER — FUROSEMIDE 10 MG/ML IJ SOLN
40.0000 mg | Freq: Once | INTRAMUSCULAR | Status: AC
  Administered 2012-02-26: 40 mg via INTRAVENOUS
  Filled 2012-02-26: qty 4

## 2012-02-26 MED ORDER — FUROSEMIDE 10 MG/ML IJ SOLN
60.0000 mg | Freq: Two times a day (BID) | INTRAMUSCULAR | Status: DC
  Administered 2012-02-26 – 2012-02-28 (×4): 60 mg via INTRAVENOUS
  Filled 2012-02-26 (×6): qty 6

## 2012-02-26 MED ORDER — MAGNESIUM SULFATE 50 % IJ SOLN
3.0000 g | Freq: Once | INTRAVENOUS | Status: AC
  Administered 2012-02-26: 3 g via INTRAVENOUS
  Filled 2012-02-26: qty 6

## 2012-02-26 MED ORDER — LEVOFLOXACIN IN D5W 750 MG/150ML IV SOLN
750.0000 mg | INTRAVENOUS | Status: DC
  Administered 2012-02-26 – 2012-02-27 (×2): 750 mg via INTRAVENOUS
  Filled 2012-02-26 (×2): qty 150

## 2012-02-26 MED ORDER — METHYLPREDNISOLONE SODIUM SUCC 125 MG IJ SOLR
80.0000 mg | Freq: Two times a day (BID) | INTRAMUSCULAR | Status: DC
  Administered 2012-02-26 – 2012-03-02 (×11): 80 mg via INTRAVENOUS
  Filled 2012-02-26 (×12): qty 1.28

## 2012-02-26 MED ORDER — OSELTAMIVIR PHOSPHATE 75 MG PO CAPS
75.0000 mg | ORAL_CAPSULE | Freq: Two times a day (BID) | ORAL | Status: DC
  Administered 2012-02-26 – 2012-02-27 (×3): 75 mg via ORAL
  Filled 2012-02-26 (×4): qty 1

## 2012-02-26 MED ORDER — FUROSEMIDE 10 MG/ML IJ SOLN
60.0000 mg | Freq: Once | INTRAMUSCULAR | Status: AC
  Administered 2012-02-26: 60 mg via INTRAVENOUS
  Filled 2012-02-26: qty 6

## 2012-02-26 NOTE — Progress Notes (Signed)
ANTICOAGULATION CONSULT NOTE - Follow Up  Pharmacy Consult for Coumadin Indication: Right Subclavian DVT  Allergies  Allergen Reactions  . Lisinopril Cough  . Codeine Itching  . Oxycodone Nausea Only  . Vicodin (Hydrocodone-Acetaminophen) Nausea Only   Labs:  Basename 02/26/12 0459 02/25/12 0300 02/24/12 0325  HGB 8.8* 8.7* --  HCT 26.8* 26.2* 26.5*  PLT 156 155 138*  APTT -- -- --  LABPROT 28.6* 27.4* 24.4*  INR 2.87* 2.71* 2.32*  HEPARINUNFRC -- -- --  CREATININE 0.85 0.84 0.80  CKTOTAL -- -- --  CKMB -- -- --  TROPONINI -- -- --    Assessment: 57 yo F on chronic anticoagulation with warfarin for right subclavian DVT. Admitted 1/14 with SOB.  Patient reportedly on Coumadin 7.5mg  po daily.    Dose on 1/16 not charted as given.    INR therapeutic today, rapid rise has slowed down (possibly from larger dose of 12/5mg  given 1/17). Will give lower-than-usual-home dose again  tonight to slow INR progression.  Hgb low but stable, no documentation of bleeding.   New drug-drug interaction with Levaquin (possible increase in INR)   Goal of Therapy:  INR 2-3 Monitor platelets by anticoagulation protocol: Yes   Plan:   Coumadin 5 mg po x 1 tonight  Daily PT/INR  Pharmacy will f/u  Darrol Angel, PharmD Pager: [phone] 02/26/2012 11:38 AM

## 2012-02-26 NOTE — Progress Notes (Signed)
Pt having increasing SOB and dyspnea at rest with 50% Venti Mask.  Checked O2 sat and it was in low 80's to high 70's.  Paged Respiratory to assess pt.  Upon assessment pt had fine crackles and respiratory put pt on non re-breather. Pt's O2 sats on NRB are high 90's to 100%.  Paged MD on call, K. Schorr to notify. Awaiting any new orders. Resp and nursing will continue to assess pt.

## 2012-02-26 NOTE — Progress Notes (Signed)
Pt found with O2 sat at 78. Pulse ox alarming. Pt woke up. Deep breathing with coaching brought O2 sat up to 92 on non-rebreather. RN notified.

## 2012-02-26 NOTE — Progress Notes (Signed)
Discussed in long length of stay meeting. 

## 2012-02-26 NOTE — Progress Notes (Signed)
Event: Notified by RN pt reporting increased SOB. Requesting Lasix. Rt and RN assessed and pt noted to have crackles bil. 02 sats dropped to high 70's low 80's on VM at 50%. Placed on NRB at 100% and sats returned to 100%. NP to bedside.  Subjective: Pt denies CP but unable to provide specific information as she is currently on NRB mask.  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 Presented to the ER  because of progressive shortness of breath on 02/19/2012. Patient has been having dyspnea for last 2-3 months and has been following with pulmonologist and cardiologist. Patient also has been recently placed on Augmentin by her primary care physician for the same reason. Patient reported her shortness of breath had worsened and was instructed to come to the ER. Since patient was short of breath and tachycardic at the time CT angiogram of the chest was done which was negative for pulmonary embolism but did show progression of fibrosis. At bedside pt noted mildly tachypneac RR-28-32 w/ 02 sats of 100% on 100% NRB. BBS very diminished w/ fine crackles at the bases. Current VS, BP-138/76, T-100.8, P-114. CXR from today revealed worsening airspace disease, particularly in the right upper lobe, compatible with progressive pneumonia. Currently on Vanc and Zosyn.  Assessment/Plan: 1. Persistent tachypnea w/ hypoxia: Pt somewhat improved on NRB at 100%. 02 sats 100% on NRM. Will give Lasix 40 IV x 1 and re-eval.  Reassessment: Pt seemed to improve somewhat w/ IV Lasix. Was able to titrate 02 down to 10L and rest. However, RT re-evaluated pt this am and found 02 sats in 78% on 10L via NRB. Pt awakened and able to get sats back up to 92%. Pt continues to be alert and oriented and now w/ increased WOB. PCXR and ABG's ordered. Will continue to monitor closely and await results. May have to consider to Bi-pap and/or transfer to SDU. Will  notify rounding MD of change in pt's status.   Leanne Chang, NP-C [facility] Pager [phone]

## 2012-02-26 NOTE — Progress Notes (Addendum)
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 Patient has been having dyspnea for last 2-3 months and has been following with pulmonologist and cardiologist. Patient also has been recently placed on Augmentin by her primary care physician for the same reason. Patient states her shortness of breath had worsened and was instructed to come to the ER. Since patient is short of breath and tachycardic CT angiogram of the chest was done which was negative for pulmonary embolism but did show progression of fibrosis.    Assessment/Plan: Acute respiratory failure Questionable etiology. May be secondary to acute lung injury versus volume overload superimposed on pulmonary fibrosis/interstitial lung disease versus worsening multifocal pneumonia superimposed on pulmonary fibrosis/interstitial lung disease. Patient with O2 sats in the 70s to 80s on a Ventimask. Patient currently on a nonrebreather 100%. ABG shows a pH of 7.37, PCO2 of 56, PO2 of 59, bicarbonate of 32. Repeat chest x-ray with diffuse bilateral airspace opacification worsened from prior study concerning for mild worsening multifocal pneumonia. Mild cardiomegaly. BNP now at 258.3 today was 21.7 on 02/19/2012. Blood cultures with no growth to date. Will check a urine Streptococcus pneumonia. Check a urine Legionella antigen. Will place on Lasix 60 mg IV every 12 hours. Continue empiric antibiotics of IV vancomycin and Zosyn. Will add Levaquin. We'll transfer to the step down unit. Pulmonary following. Shortness of breath Etiology unclear. Spiked another temperature last night. Antibiotics were broadened to Vancomycin and Zosyn on 02/22/2012, as patient is on chemo.  WBC normal. Influenza panel negative.  Patient's shortness of breath could also be from progression of her lung fibrosis versus acute lung injury versus CHF superimposed on pulmonary fibrosis versus worsening multifocal pneumonia.. Is not on O2 at home, will need desaturation study prior to discharge to determine if the patient needs oxygen at home. Has seen Dr. Marchelle Gearing in the past. Pro-BNP normal on admission. Patient has been seen in consultation by Dr Shelle Iron of pulmonary who recommended a dose of diuretics, continued antibiotics, autoimmune studies and possible consideration of steriods to be determined by pulmonary this week. Continue PRN neb treatments.  BNP is rising. Will place on Lasix 60 mg IV every 12 hours. Will add Levaquin to current antibiotic regimen. Pulmonary is following.  Progressive interstitial lung disease Per CT chest. Patient has been seen in consultation by Dr. Shelle Iron of pulmonary who felt CT findings could represent an infection, overload and also an inflammatory process superimposed on her interstitial disease. Autoimmune labs have been ordered. ANA is less than 2.0. Double-stranded DNA antibodies 1. Rheumatoid factor of 14. The clinic citrullin peptide antibody less than 2. Patient was given a dose of IV Lasix per pulmonary yesterday. Was recommended to continue empiric IV antibiotic treatment. To be determined per pulmonary later on as to whether patient may need a trial of empiric steroids. We'll place on Lasix 60 mg IV every 12 hours. Will also add Levaquin to current regimen. Pulmonary following and appreciate input and recommendations.   Fever Unclear etiology. Antibiotics as indicated above. CT angio negative for PE on 02/19/2012, progressive pulmonary parenchymal disease.?? Pneumonitis vs multifocal pneumonia. Will defer to pulmonary  Hypokalemia/hypomagnesemia Repleted.  Sinus tachycardia Patient has been worked up for this as outpatient with cardiologist and no  specific cause has been  found. 2-D echo done in July 2013 was showing EF of 60-65%. Increased metoprolol to 200mg  daily yesterday. Thyroid function tests normal on 02/20/2012. No events on telemetry, CTA negative.  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   Right subclavian DVT Coumadin per pharmacy.  Anemia Likely secondary to anemia of chronic disease. Check an anemia panel. H&H is stable. No overt GI bleed. Follow.  Hyponatremia Likely secondary to problems #1 and 2. Will check a urine sodium and a urine creatinine. Check a urine osmolality. Check a serum osmolality. Will diuresis with IV Lasix. Follow.  Code Status: Full Family Communication: Updated Patient at bedside Disposition Plan: Pending improvement in fevers and acute respiratory failure   Consultants:  Pulmonary: Dr Shelle Iron 02/23/12  Procedures:  CT ango chest 02/19/2012  Antibiotics:  Levaquin 1/14 >> 1/17  Vancomycin 1/17 >>  Zosyn 1/17 >>  Levaquin 02/26/2012  HPI/Subjective: Patient with fever of 100.8 earlier this morning. Shortness of breath with exertion. Patient with desats into the 70s on room at per nursing. Events overnight noted with patient's sats drop into the high 70s and low 80s on a Ventimask at 50%. Patient also noted to be tachypneic. Patient given a dose of 40 of Lasix IV x1 overnight. Repeat chest x-ray done. ABG done the patient noted to be hypercarbic and hypoxic.  Objective: Filed Vitals:   02/26/12 0233 02/26/12 0401 02/26/12 0500 02/26/12 0526  BP:    102/78  Pulse:    114  Temp:    99.7 F (37.6 C)  TempSrc:    Oral  Resp:    24  Height:      Weight:   104.6 kg (230 lb 9.6 oz)   SpO2: 95% 92%  92%    Intake/Output Summary (Last 24 hours) at 02/26/12 0810 Last data filed at 02/26/12 0532  Gross per 24 hour  Intake    690 ml  Output   2050 ml  Net  -1360 ml   Filed Weights   02/24/12 0535 02/25/12 0629 02/26/12 0500  Weight: 103.4 kg (227 lb 15.3 oz)  102.3 kg (225 lb 8.5 oz) 104.6 kg (230 lb 9.6 oz)    Exam: Physical Exam: General: Awake, Oriented, No acute distress. On NRB at 100% HEENT: EOMI. Neck: Supple CV: S1 and S2 Lungs: Good air movement, crackles at bases bilaterally. Abdomen: Soft, Nontender, Nondistended, +bowel sounds. Ext: Good pulses. Trace edema.  Data Reviewed: Basic Metabolic Panel:  Lab 02/26/12 0865 02/25/12 0300 02/24/12 0325 02/23/12 0445 02/22/12 0620  NA 126* 131* 129* 128* 133*  K 3.8 3.5 3.0* 3.3* 3.6  CL 88* 91* 89* 91* 97  CO2 31 31 32 29 28  GLUCOSE 124* 127* 112* 146* 103*  BUN 10 10 11 11 17   CREATININE 0.85 0.84 0.80 0.76 0.75  CALCIUM 8.8 8.7 8.4 8.5 8.9  MG -- 1.7 1.0* -- --  PHOS -- -- -- -- --   Liver Function Tests:  Lab 02/19/12 1545  AST 28  ALT 16  ALKPHOS 99  BILITOT 0.4  PROT 6.7  ALBUMIN 3.0*   No results found for this basename: LIPASE:5,AMYLASE:5 in the last 168 hours No results found for this basename: AMMONIA:5 in the last 168 hours CBC:  Lab 02/26/12 0459 02/25/12 0300 02/24/12 0325 02/23/12 0445 02/22/12 0620 02/19/12 1545  WBC 7.7 7.3 8.1 7.7 8.7 --  NEUTROABS -- -- -- -- -- 5.8  HGB 8.8* 8.7* 8.8* 8.7* 9.4* --  HCT 26.8* 26.2* 26.5* 26.9*  28.3* --  MCV 91.5 91.0 90.8 91.8 93.4 --  PLT 156 155 138* 152 180 --   Cardiac Enzymes:  Lab 02/19/12 1545  CKTOTAL --  CKMB --  CKMBINDEX --  TROPONINI <0.30   BNP (last 3 results)  Basename 02/26/12 0459 02/19/12 1545 08/14/11 1522  PROBNP 258.3* 21.7 18.0   CBG: No results found for this basename: GLUCAP:5 in the last 168 hours  Recent Results (from the past 240 hour(s))  CULTURE, BLOOD (ROUTINE X 2)     Status: Normal (Preliminary result)   Collection Time   02/19/12 11:36 PM      Component Value Range Status Comment   Specimen Description BLOOD LEFT HAND   Final    Special Requests BOTTLES DRAWN AEROBIC AND ANAEROBIC 5CC   Final    Culture  Setup Time 02/20/2012 06:16   Final    Culture     Final     Value:        BLOOD CULTURE RECEIVED NO GROWTH TO DATE CULTURE WILL BE HELD FOR 5 DAYS BEFORE ISSUING A FINAL NEGATIVE REPORT   Report Status PENDING   Incomplete   CULTURE, BLOOD (ROUTINE X 2)     Status: Normal (Preliminary result)   Collection Time   02/19/12 11:36 PM      Component Value Range Status Comment   Specimen Description BLOOD LEFT ANTECUBITAL   Final    Special Requests BOTTLES DRAWN AEROBIC AND ANAEROBIC 5CC   Final    Culture  Setup Time 02/20/2012 06:16   Final    Culture     Final    Value:        BLOOD CULTURE RECEIVED NO GROWTH TO DATE CULTURE WILL BE HELD FOR 5 DAYS BEFORE ISSUING A FINAL NEGATIVE REPORT   Report Status PENDING   Incomplete   CULTURE, BLOOD (ROUTINE X 2)     Status: Normal (Preliminary result)   Collection Time   02/23/12  9:05 AM      Component Value Range Status Comment   Specimen Description BLOOD LEFT ARM   Final    Special Requests BOTTLES DRAWN AEROBIC AND ANAEROBIC 5CC   Final    Culture  Setup Time 02/23/2012 15:06   Final    Culture     Final    Value:        BLOOD CULTURE RECEIVED NO GROWTH TO DATE CULTURE WILL BE HELD FOR 5 DAYS BEFORE ISSUING A FINAL NEGATIVE REPORT   Report Status PENDING   Incomplete   CULTURE, BLOOD (ROUTINE X 2)     Status: Normal (Preliminary result)   Collection Time   02/23/12  9:11 AM      Component Value Range Status Comment   Specimen Description BLOOD RIGHT ARM   Final    Special Requests BOTTLES DRAWN AEROBIC AND ANAEROBIC 4CC   Final    Culture  Setup Time 02/23/2012 15:06   Final    Culture     Final    Value:        BLOOD CULTURE RECEIVED NO GROWTH TO DATE CULTURE WILL BE HELD FOR 5 DAYS BEFORE ISSUING A FINAL NEGATIVE REPORT   Report Status PENDING   Incomplete   URINE CULTURE     Status: Normal   Collection Time   02/24/12  1:34 AM      Component Value Range Status Comment   Specimen Description URINE, CLEAN CATCH   Final    Special Requests NONE  Final    Culture  Setup Time 02/24/2012 12:36    Final    Colony Count NO GROWTH   Final    Culture NO GROWTH   Final    Report Status 02/25/2012 FINAL   Final      Studies: Dg Chest 2 View  02/25/2012  *RADIOLOGY REPORT*  Clinical Data: Severe shortness of breath.  CHEST - 2 VIEW  Comparison: CT and plain films of the chest 02/19/2012.  Findings: There has been worsening of airspace disease with markedly increased opacity seen in the right upper lobe.  Bibasilar airspace opacities also shows some progression.  There is cardiomegaly.  Chronic interstitial change again noted.  IMPRESSION: Worsened airspace disease, particularly in the right upper lobe, compatible with progressive pneumonia.   Original Report Authenticated By: Holley Dexter, M.D.    Dg Chest Port 1 View  02/26/2012  *RADIOLOGY REPORT*  Clinical Data: Worsening shortness of breath; difficulty breathing.  PORTABLE CHEST - 1 VIEW  Comparison: Chest radiograph performed 02/25/2012  Findings: Diffuse bilateral airspace opacification is noted, perhaps slightly worsened from the prior study.  This is concerning for mildly worsening multifocal pneumonia.  No pleural effusion or pneumothorax is seen.  The cardiomediastinal silhouette is mildly enlarged.  No acute osseous abnormalities are identified.  IMPRESSION:  1.  Diffuse bilateral airspace opacification, perhaps slightly worsened from the prior study.  This is concerning for severe mildly worsening multifocal pneumonia. 2.  Mild cardiomegaly.   Original Report Authenticated By: Tonia Ghent, M.D.     Scheduled Meds:    . amLODipine  5 mg Oral Daily  . cholecalciferol  3,000 Units Oral Daily  . furosemide  60 mg Intravenous Once  . furosemide  60 mg Intravenous Q12H  . levofloxacin (LEVAQUIN) IV  750 mg Intravenous Q24H  . metoprolol succinate  200 mg Oral Daily  . piperacillin-tazobactam (ZOSYN)  IV  3.375 g Intravenous Q8H  . potassium chloride SA  20 mEq Oral Daily  . sodium chloride  3 mL Intravenous Q12H  . vancomycin   1,250 mg Intravenous Q12H  . Warfarin - Pharmacist Dosing Inpatient   Does not apply q1800   Continuous Infusions:   Principal Problem:  *Acute respiratory failure Active Problems:  Fallopian tube cancer, BRCA2 positive  Hyponatremia  Anemia  Sinus tachycardia  ILD (interstitial lung disease)  DVT (deep venous thrombosis)  SOB (shortness of breath)  Hypomagnesemia  Hypokalemia   [facility]  [facility] Pager [phone]. If 7PM-7AM, please contact night-coverage at [email or web address], password [facility] 02/26/2012, 8:10 AM  LOS: 7 days

## 2012-02-27 ENCOUNTER — Inpatient Hospital Stay (HOSPITAL_COMMUNITY): Payer: Medicare Other

## 2012-02-27 ENCOUNTER — Encounter (HOSPITAL_COMMUNITY): Payer: Self-pay | Admitting: *Deleted

## 2012-02-27 DIAGNOSIS — E538 Deficiency of other specified B group vitamins: Secondary | ICD-10-CM | POA: Diagnosis present

## 2012-02-27 DIAGNOSIS — J189 Pneumonia, unspecified organism: Secondary | ICD-10-CM | POA: Diagnosis present

## 2012-02-27 DIAGNOSIS — D638 Anemia in other chronic diseases classified elsewhere: Secondary | ICD-10-CM | POA: Diagnosis present

## 2012-02-27 LAB — COMPREHENSIVE METABOLIC PANEL
AST: 30 U/L (ref 0–37)
BUN: 18 mg/dL (ref 6–23)
CO2: 35 mEq/L — ABNORMAL HIGH (ref 19–32)
Calcium: 9.3 mg/dL (ref 8.4–10.5)
Creatinine, Ser: 1.06 mg/dL (ref 0.50–1.10)
GFR calc Af Amer: 66 mL/min — ABNORMAL LOW (ref >90)
GFR calc non Af Amer: 57 mL/min — ABNORMAL LOW (ref >90)
Glucose, Bld: 145 mg/dL — ABNORMAL HIGH (ref 70–99)

## 2012-02-27 LAB — CBC WITH DIFFERENTIAL/PLATELET
Basophils Absolute: 0 10*3/uL (ref 0.0–0.1)
Eosinophils Relative: 0 % (ref 0–5)
HCT: 26.8 % — ABNORMAL LOW (ref 36.0–46.0)
Lymphocytes Relative: 4 % — ABNORMAL LOW (ref 12–46)
MCV: 90.8 fL (ref 78.0–100.0)
Monocytes Absolute: 0.4 10*3/uL (ref 0.1–1.0)
RDW: 16.1 % — ABNORMAL HIGH (ref 11.5–15.5)
WBC: 8.1 10*3/uL (ref 4.0–10.5)

## 2012-02-27 LAB — MAGNESIUM: Magnesium: 1.6 mg/dL (ref 1.5–2.5)

## 2012-02-27 LAB — LEGIONELLA ANTIGEN, URINE: Legionella Antigen, Urine: NEGATIVE

## 2012-02-27 LAB — PROCALCITONIN: Procalcitonin: 0.18 ng/mL

## 2012-02-27 LAB — PRO B NATRIURETIC PEPTIDE: Pro B Natriuretic peptide (BNP): 167.5 pg/mL — ABNORMAL HIGH (ref 0–125)

## 2012-02-27 LAB — ANCA SCREEN W REFLEX TITER: Atypical p-ANCA Screen: NEGATIVE

## 2012-02-27 MED ORDER — CYANOCOBALAMIN 1000 MCG/ML IJ SOLN
1000.0000 ug | Freq: Once | INTRAMUSCULAR | Status: AC
  Administered 2012-02-27: 1000 ug via INTRAMUSCULAR
  Filled 2012-02-27: qty 1

## 2012-02-27 MED ORDER — DEXTROSE 5 % IV SOLN
500.0000 mg | INTRAVENOUS | Status: AC
  Administered 2012-02-27 – 2012-03-01 (×4): 500 mg via INTRAVENOUS
  Filled 2012-02-27 (×4): qty 500

## 2012-02-27 MED ORDER — DEXTROSE 5 % IV SOLN
1.0000 g | Freq: Three times a day (TID) | INTRAVENOUS | Status: AC
  Administered 2012-02-27 – 2012-03-02 (×12): 1 g via INTRAVENOUS
  Filled 2012-02-27 (×13): qty 1

## 2012-02-27 MED ORDER — BIOTENE DRY MOUTH MT LIQD
15.0000 mL | Freq: Two times a day (BID) | OROMUCOSAL | Status: DC
  Administered 2012-02-27 – 2012-03-05 (×12): 15 mL via OROMUCOSAL

## 2012-02-27 MED ORDER — CHLORHEXIDINE GLUCONATE 0.12 % MT SOLN
15.0000 mL | Freq: Two times a day (BID) | OROMUCOSAL | Status: DC
  Administered 2012-02-27 – 2012-03-05 (×14): 15 mL via OROMUCOSAL
  Filled 2012-02-27 (×14): qty 15

## 2012-02-27 NOTE — Progress Notes (Signed)
ANTIBIOTIC CONSULT NOTE - F/U  Pharmacy Consult for Vancomycin/zosyn, levaquin per MD Indication: pneumonia  Allergies  Allergen Reactions  . Lisinopril Cough  . Codeine Itching  . Oxycodone Nausea Only  . Vicodin (Hydrocodone-Acetaminophen) Nausea Only    Patient Measurements: Height: 5\' 1"  (154.9 cm) Weight: 229 lb 15 oz (104.3 kg) IBW/kg (Calculated) : 47.8  Adjusted Body Weight:   Vital Signs: Temp: 97.8 F (36.6 C) (01/22 0800) Temp src: Axillary (01/22 0800) BP: 115/77 mmHg (01/22 0725) Pulse Rate: 94  (01/22 0725) Intake/Output from previous day: 01/21 0701 - 01/22 0700 In: 707.5 [IV Piggyback:387.5] Out: 2775 [Urine:2775] Intake/Output from this shift: Total I/O In: 58.5 [Other:40; IV Piggyback:18.5] Out: -   Labs:  Basename 02/27/12 0405 02/26/12 1013 02/26/12 0459 02/25/12 0300  WBC 8.1 -- 7.7 7.3  HGB 8.9* -- 8.8* 8.7*  PLT 164 -- 156 155  LABCREA -- 14.6 -- --  CREATININE 1.06 -- 0.85 0.84   Estimated Creatinine Clearance: 64.3 ml/min (by C-G formula based on Cr of 1.06).  Basename 02/24/12 2115  VANCOTROUGH 13.7  VANCOPEAK --  VANCORANDOM --  GENTTROUGH --  GENTPEAK --  GENTRANDOM --  TOBRATROUGH --  TOBRAPEAK --  TOBRARND --  AMIKACINPEAK --  AMIKACINTROU --  AMIKACIN --     Microbiology: Recent Results (from the past 720 hour(s))  CULTURE, BLOOD (ROUTINE X 2)     Status: Normal   Collection Time   02/19/12 11:36 PM      Component Value Range Status Comment   Specimen Description BLOOD LEFT HAND   Final    Special Requests BOTTLES DRAWN AEROBIC AND ANAEROBIC 5CC   Final    Culture  Setup Time 02/20/2012 06:16   Final    Culture NO GROWTH 5 DAYS   Final    Report Status 02/26/2012 FINAL   Final   CULTURE, BLOOD (ROUTINE X 2)     Status: Normal   Collection Time   02/19/12 11:36 PM      Component Value Range Status Comment   Specimen Description BLOOD LEFT ANTECUBITAL   Final    Special Requests BOTTLES DRAWN AEROBIC AND ANAEROBIC  5CC   Final    Culture  Setup Time 02/20/2012 06:16   Final    Culture NO GROWTH 5 DAYS   Final    Report Status 02/26/2012 FINAL   Final   CULTURE, BLOOD (ROUTINE X 2)     Status: Normal (Preliminary result)   Collection Time   02/23/12  9:05 AM      Component Value Range Status Comment   Specimen Description BLOOD LEFT ARM   Final    Special Requests BOTTLES DRAWN AEROBIC AND ANAEROBIC 5CC   Final    Culture  Setup Time 02/23/2012 15:06   Final    Culture     Final    Value:        BLOOD CULTURE RECEIVED NO GROWTH TO DATE CULTURE WILL BE HELD FOR 5 DAYS BEFORE ISSUING A FINAL NEGATIVE REPORT   Report Status PENDING   Incomplete   CULTURE, BLOOD (ROUTINE X 2)     Status: Normal (Preliminary result)   Collection Time   02/23/12  9:11 AM      Component Value Range Status Comment   Specimen Description BLOOD RIGHT ARM   Final    Special Requests BOTTLES DRAWN AEROBIC AND ANAEROBIC 4CC   Final    Culture  Setup Time 02/23/2012 15:06   Final  Culture     Final    Value:        BLOOD CULTURE RECEIVED NO GROWTH TO DATE CULTURE WILL BE HELD FOR 5 DAYS BEFORE ISSUING A FINAL NEGATIVE REPORT   Report Status PENDING   Incomplete   URINE CULTURE     Status: Normal   Collection Time   02/24/12  1:34 AM      Component Value Range Status Comment   Specimen Description URINE, CLEAN CATCH   Final    Special Requests NONE   Final    Culture  Setup Time 02/24/2012 12:36   Final    Colony Count NO GROWTH   Final    Culture NO GROWTH   Final    Report Status 02/25/2012 FINAL   Final   MRSA PCR SCREENING     Status: Normal   Collection Time   02/26/12  2:43 PM      Component Value Range Status Comment   MRSA by PCR NEGATIVE  NEGATIVE Final     Medical History: Past Medical History  Diagnosis Date  . Hypertension   . Obesity   . Diverticulitis   . Hemorrhoids   . Colonic polyp   . GERD (gastroesophageal reflux disease)   . Cancer     BREAST, FALLOPIAN TUBE,  . Blood clot of neck vein      Assessment: 48 yoF previously on levaquin for PNA, 1/17  Vancomycin and Zosyn for broadened coverage due to continued SOB and new fever.    1/14 >> Levaquin >> 1/17 1/17 >> Vanc >> 1/17 >> Zosyn >> 1/21 >> Levaquin resumed (MD) >> 1/21 >> tamiflu >>  Tmax24h: afebrile, fever resolved WBCs: 8.1 Renal: 1.06 (inc), CrCl > 50 PCT: 0.18  1/14 blood x 2 >> NG 1/18 blood x 2 >> NGTD 1/19 urine >> NG 1/15 influenza panel >> negative S. pneumo Ag: neg Legionella: p ending  Dose changes/drug level info:  1/19 - VT= 13.7 on 1gm IV q12h, increase Vanc to 1250 q12h.   Goal of Therapy:  Vancomycin trough level 15-20 mcg/ml  Plan:  Day #6 vancomycin/zosyn, D#2 Levofloxacin/tamiflu 1.  Bump in SCr, check Vancomycin trough tonight 2. Zosyn dose remains appropriate for renal fx 3. Suggest D/C tamiflu now that influenza panel NEGATIVE      Suzette Battiest, Tad Moore 02/27/2012,9:57 AM

## 2012-02-27 NOTE — Progress Notes (Signed)
NAME:  Abigail [name], Abigail [name] NO.:  [id number]    HISTORY OF PRESENT ILLNESS:  The patient is a very pleasant 57 year old female who PCCM has been asked to see for an abnormal CT of chest as well as worsening dyspnea on exertion. 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  She most recently had a round of chemotherapy the beginning of this month.    She is known to have non specific ILD findings 2009-> 2013 that were stable and  with calcified hilar nodes. In June 2013 when last seen by pccm in office she had dyspnea out of proportion to CT findings. Dyspnea felt due to be deconditoning, tachycardia (ultimate cardiac wokrup negative), obesity as prime factors. Summer 2013- PE was also ruled out. After this she did not fu with PCCM; a PFT had been scheduled  Cultures Influenza A 1/21 - NEG Influenza B 1/21 - NEG H1N1 1/21 - NEG Urine Culture - 1/19 - No Growth Blood Culturex2 1/18>>>  Antibiotics Zosyn 1/17>>>1/22 Vancomycin 1/20>>>1/22 Levofloxacin 1/21>>>1/22 Ceftaz 1/23>>> azithro 1/23>>>  Interval hx  She had increased WOB and O2 demands, subsequently placed  placed on NIV-PS to avoid the need for intubation.  Steroids started.  Seems clinical improved with these interventions.   PHYSICAL EXAMINATION:   BP 115/77  Pulse 94  Temp 97.8 F (36.6 C) (Axillary)  Resp 30  Ht 5\' 1"  (1.549 m)  Wt 104.3 kg (229 lb 15 oz)  BMI 43.45 kg/m2  SpO2 92%  Intake/Output Summary (Last 24 hours) at 02/27/12 0849 Last data filed at 02/27/12 0600  Gross per 24 hour  Intake    675 ml  Output   2525 ml  Net  -1850 ml  60% on BIPAP   HEENT:  Pupils are equal, round, and reactive to light.  Extraocular muscles are intact.  Oropharynx is clear. CHEST:  Crackles, diminished in bases.  CARDIAC: regular rhythm, no murmurs, rubs, or gallops. ABDOMEN:  Soft, nontender with hypoactive bowel sounds. EXTREMITIES:   Lower extremities with mild edema and weak peripheral pulses, no cyanosis. NEUROLOGIC:  She is alert and oriented x4, and moves all four extremities.  No focal deficits.   Lab 02/27/12 0405 02/26/12 0459 02/25/12 0300  NA 131* 126* 131*  K 3.5 3.8 3.5  CL 87* 88* 91*  CO2 35* 31 31  BUN 18 10 10   CREATININE 1.06 0.85 0.84  GLUCOSE 145* 124* 127*    Lab 02/27/12 0405 02/26/12 0459 02/25/12 0300  HGB 8.9* 8.8* 8.7*  HCT 26.8* 26.8* 26.2*  WBC 8.1 7.7 7.3  PLT 164 156 155   Lab Results  Component Value Date   INR 5.08* 02/27/2012   INR 2.87* 02/26/2012   INR 2.71* 02/25/2012   PROTIME 21.6* 02/07/2012   PROTIME 24.0* 01/11/2012   PROTIME 25.2* 12/14/2011   CBG (last 3)  No results found for this basename: GLUCAP:3 in the last 72 hours  Lab 02/27/12 0405 02/26/12 1420 02/26/12 0459 02/25/12 0300 02/24/12 0325  PROCALCITON 0.18 0.18 -- -- --  WBC 8.1 -- 7.7 7.3 8.1  LATICACIDVEN -- -- -- -- --   Erythrocyte Sedimentation Rate     Component Value Date/Time   ESRSEDRATE 90* 02/23/2012 1749  ANA: neg RF: neg Influenza pcr: neg   PCXR: No worsening or significant change  IMPRESSION:   Pulmonary Progressive  acute respiratory failure in the setting of worsening pulmonary infiltrates. ? Chemo related pneumonitis ? Volume overload: seems doubtful  ? Infectious vs auto-immune: autoimmune neg to date.  Appears to be clinically improved with steroid administration which would support pneumonitis  Plan: Cont antibiotics for possible infection (see below) Cont Diurese as tolerated Cont Steroids  Daily cxr Cycle NIV mask 4 hours on 2 hours off as tolerated -  Keep SPO2>90%  Cardiac Hypertension - Controlled on PO meds  Plan Cont PO meds  Infectious disease Possible RNA (NOS) WBC stable, influenza negative  PCT negative  Plan Trend WBC F/u cultures See dashboard, have stopped levaquin d/t rising INR, and stopped vanc as no MRSA identified.  Will complete 4d azithro  and 4 d of ceftaz (as abx started on 1/17) assuming no further orgs identified.   Renal Mild hyponatremia, Mild hypochloremia  Urine Osmo decreased Urine Cr and Na WNL Plan F/u chem Strict I&O  Endocrine  Thyroid function test WNL BG controlled, will check CBG q 4, add insulin if elevated.  Plan F/u chem  GI NPO Plan H2O sips/Ice chips as tolerated when NIV mask is off   hematology  Anemia: Likely chronic disease. No evidence of bleeding. H&H stable DVT (right [place] vein): was on coumadin for this Coumadin induced coagulopathy: INR increased to 5.08 on 1/22 from 2.87 on 1/21 Fallopian tube carcinoma  Plan Per heme/onc Repeat INR later on 1/22, if still elevated will give [facility] to adjust coumadin  Will stop quinolone rx   Neuro Intact  Plan Supportive care      02/27/2012 8:49 AM

## 2012-02-27 NOTE — Progress Notes (Signed)
STAFF NOTE: I, Dr Lavinia Sharps have personally reviewed patient's available data, including medical history, events of note, physical examination and test results as part of my evaluation. I have discussed with resident/NP and other care providers such as pharmacist, RN and RRT.  In addition,  I personally evaluated patient and elicited key findings of  Acute resp failur with ALI with broad ddx Impriving on steroids, diuresis, bipap. Continue same.  Rest per NP/medical resident whose note is outlined above and that I agree with  The patient is critically ill with multiple organ systems failure and requires high complexity decision making for assessment and support, frequent evaluation and titration of therapies, application of advanced monitoring technologies and extensive interpretation of multiple databases.   Critical Care Time devoted to patient care services described in this note is  31  Minutes.  Dr. Kalman Shan, M.D., [facility].C.P Pulmonary and Critical Care Medicine Staff Physician [facility] Pager: [phone], If no answer or between  15:00h - 7:00h: call [phone]  02/27/2012 5:58 PM

## 2012-02-27 NOTE — Progress Notes (Signed)
Chaplain provided emotional and spiritual support with pt at bedside

## 2012-02-27 NOTE — Progress Notes (Deleted)
ANTIBIOTIC CONSULT NOTE - F/U  Pharmacy Consult for Vancomycin/zosyn, levaquin per MD Indication: pneumonia  Allergies  Allergen Reactions  . Lisinopril Cough  . Codeine Itching  . Oxycodone Nausea Only  . Vicodin (Hydrocodone-Acetaminophen) Nausea Only    Patient Measurements: Height: 5\' 1"  (154.9 cm) Weight: 229 lb 15 oz (104.3 kg) IBW/kg (Calculated) : 47.8  Adjusted Body Weight:   Vital Signs: Temp: 97.8 F (36.6 C) (01/22 0800) Temp src: Axillary (01/22 0800) BP: 115/77 mmHg (01/22 0725) Pulse Rate: 94  (01/22 0725) Intake/Output from previous day: 01/21 0701 - 01/22 0700 In: 707.5 [IV Piggyback:387.5] Out: 2775 [Urine:2775] Intake/Output from this shift: Total I/O In: 58.5 [Other:40; IV Piggyback:18.5] Out: -   Labs:  Basename 02/27/12 0405 02/26/12 1013 02/26/12 0459 02/25/12 0300  WBC 8.1 -- 7.7 7.3  HGB 8.9* -- 8.8* 8.7*  PLT 164 -- 156 155  LABCREA -- 14.6 -- --  CREATININE 1.06 -- 0.85 0.84   Estimated Creatinine Clearance: 64.3 ml/min (by C-G formula based on Cr of 1.06).  Basename 02/24/12 2115  VANCOTROUGH 13.7  VANCOPEAK --  VANCORANDOM --  GENTTROUGH --  GENTPEAK --  GENTRANDOM --  TOBRATROUGH --  TOBRAPEAK --  TOBRARND --  AMIKACINPEAK --  AMIKACINTROU --  AMIKACIN --     Microbiology: Recent Results (from the past 720 hour(s))  CULTURE, BLOOD (ROUTINE X 2)     Status: Normal   Collection Time   02/19/12 11:36 PM      Component Value Range Status Comment   Specimen Description BLOOD LEFT HAND   Final    Special Requests BOTTLES DRAWN AEROBIC AND ANAEROBIC 5CC   Final    Culture  Setup Time 02/20/2012 06:16   Final    Culture NO GROWTH 5 DAYS   Final    Report Status 02/26/2012 FINAL   Final   CULTURE, BLOOD (ROUTINE X 2)     Status: Normal   Collection Time   02/19/12 11:36 PM      Component Value Range Status Comment   Specimen Description BLOOD LEFT ANTECUBITAL   Final    Special Requests BOTTLES DRAWN AEROBIC AND ANAEROBIC  5CC   Final    Culture  Setup Time 02/20/2012 06:16   Final    Culture NO GROWTH 5 DAYS   Final    Report Status 02/26/2012 FINAL   Final   CULTURE, BLOOD (ROUTINE X 2)     Status: Normal (Preliminary result)   Collection Time   02/23/12  9:05 AM      Component Value Range Status Comment   Specimen Description BLOOD LEFT ARM   Final    Special Requests BOTTLES DRAWN AEROBIC AND ANAEROBIC 5CC   Final    Culture  Setup Time 02/23/2012 15:06   Final    Culture     Final    Value:        BLOOD CULTURE RECEIVED NO GROWTH TO DATE CULTURE WILL BE HELD FOR 5 DAYS BEFORE ISSUING A FINAL NEGATIVE REPORT   Report Status PENDING   Incomplete   CULTURE, BLOOD (ROUTINE X 2)     Status: Normal (Preliminary result)   Collection Time   02/23/12  9:11 AM      Component Value Range Status Comment   Specimen Description BLOOD RIGHT ARM   Final    Special Requests BOTTLES DRAWN AEROBIC AND ANAEROBIC 4CC   Final    Culture  Setup Time 02/23/2012 15:06   Final  Culture     Final    Value:        BLOOD CULTURE RECEIVED NO GROWTH TO DATE CULTURE WILL BE HELD FOR 5 DAYS BEFORE ISSUING A FINAL NEGATIVE REPORT   Report Status PENDING   Incomplete   URINE CULTURE     Status: Normal   Collection Time   02/24/12  1:34 AM      Component Value Range Status Comment   Specimen Description URINE, CLEAN CATCH   Final    Special Requests NONE   Final    Culture  Setup Time 02/24/2012 12:36   Final    Colony Count NO GROWTH   Final    Culture NO GROWTH   Final    Report Status 02/25/2012 FINAL   Final   MRSA PCR SCREENING     Status: Normal   Collection Time   02/26/12  2:43 PM      Component Value Range Status Comment   MRSA by PCR NEGATIVE  NEGATIVE Final     Medical History: Past Medical History  Diagnosis Date  . Hypertension   . Obesity   . Diverticulitis   . Hemorrhoids   . Colonic polyp   . GERD (gastroesophageal reflux disease)   . Cancer     BREAST, FALLOPIAN TUBE,  . Blood clot of neck vein      Assessment: 56 yoF previously on levaquin for PNA, 1/17  Vancomycin and Zosyn for broadened coverage due to continued SOB and new fever.    1/14 >> Levaquin >> 1/17 1/17 >> Vanc >> 1/17 >> Zosyn >> 1/21 >> Levaquin resumed (MD) >> 1/21 >> tamiflu >>  Tmax24h: afebrile, fever resolved WBCs: 8.1 Renal: 1.06 (inc), CrCl > 50 PCT: 0.18  1/14 blood x 2 >> NG 1/18 blood x 2 >> NGTD 1/19 urine >> NG 1/15 influenza panel >> negative S. pneumo Ag: neg Legionella: p ending  Dose changes/drug level info:  1/19 - VT= 13.7 on 1gm IV q12h, increase Vanc to 1250 q12h. 1/22 - VT = ___ on 1250mg  IV q12h (prior to 6th dose)  Goal of Therapy:  Vancomycin trough level 15-20 mcg/ml  Plan:  Day #6 vancomycin/zosyn, D#2 Levofloxacin/tamiflu 1.  Bump in SCr, check Vancomycin trough tonight 2. Zosyn dose remains appropriate for renal fx 3. Suggest D/C tamiflu now that influenza panel NEGATIVE      Abigail [name], Tad Moore 02/27/2012,10:39 AM

## 2012-02-27 NOTE — Progress Notes (Signed)
[facility] PROGRESS NOTE  Abigail [name] NWG:[id number] DOB: [birth year] DOA: 02/19/2012 PCP: Carollee Herter, MD  Brief narrative: Abigail [name] [name] a 57 year old woman with a past medical history of pulmonary fibrosis, breast cancer, and fallopian tube cancer, last chemotherapy 02/07/2012, was admitted to the hospital on 02/19/2012 with acute respiratory failure. A CT angiogram was done on 02/19/2012 which showed progression of pulmonary fibrosis. The pulmonary team has been consulting.  Assessment/Plan: Principal Problem:  *Acute respiratory failure with hypoxia / ILD / shortness of breath  Multifactorial. Known interstitial lung disease. Autoimmune workup negative to date. ANA negative. Rheumatoid factor 14.  CCP less than 2. Extractable nuclear antigen antibodies all negative. Followup ANCA.  ACE levels WNL.  Was transferred to the ICU for progressive respiratory failure necessitating BiPAP.  Pulmonary on board. Felt to be too high risk for bronchoscopy at present time.  Patient [name] also being treated for diastolic congestive heart failure, possible acute respiratory distress syndrome, and healthcare associated pneumonia. 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 Active Problems:  Healthcare associated pneumonia  Procalcitonin not elevated, so sepsis felt to be unlikely. Blood cultures negative to date.  Influenza panel negative. Strep pneumonia antigen negative. Legionella antigen and process.  Continue broad-spectrum antibiotics including vancomycin, Zosyn, and Levaquin.  Fallopian tube cancer, BRCA2 positive  Followup with oncology post discharge.  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 May also be a reflection of congestive heart failure.  Urine osmolality low at 276. Urine sodium 103. Serum osmolality 266. 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  Sodium trending up over time. Improved with diuresis.  Normocytic Anemia / vitamin B 12 deficiency /  anemia of chronic disease  Vitamin B 12 level low at 206. We'll place on vitamin B12 supplementation.  Serum folate normal at 6.8. Low iron at less than 10. Urine iron binding capacity 212, ferritin high at 622  Sinus tachycardia  Patient has been worked up for this as outpatient with cardiologist and no specific cause has been found. 2-D echo done in July 2013 was showing EF of 60-65%. Continue increased dose of metoprolol.   Thyroid function tests normal on 02/20/2012.   No events on telemetry, CTA negative.  DVT (deep venous thrombosis)  Continue Coumadin per pharmacy.  Hypomagnesemia / Hypokalemia  Monitor and replete as needed.  Acute on chronic diastolic congestive heart failure  Diuresed. ProBNP 167.5 today.  Code Status: Full. Family Communication: None at bedside. Disposition Plan: Home when stable.   Medical Consultants:  Dr. Marcelyn Bruins, PCCM  Other Consultants:  Physical therapy: Home health PT versus SNF, [facility] aide.  Anti-infectives: Antibiotics:  Levaquin 1/14 >> 1/17  Vancomycin 1/17 >>  Zosyn 1/17 >>  Levaquin 02/26/2012  HPI/Subjective: Abigail [name] currently on bipap in the SDU.  She states the bipap [name] helping her with her dyspnea.  Occasional cough.  No chest pain.  Objective: Filed Vitals:   02/26/12 2300 02/27/12 0000 02/27/12 0416 02/27/12 0500  BP: 122/70 125/101 119/88   Pulse: 95 95 89   Temp:  98 F (36.7 C) 98 F (36.7 C)   TempSrc:  Axillary Axillary   Resp: 28 27 24    Height:      Weight:    104.3 kg (229 lb 15 oz)  SpO2: 88% 94% 94%     Intake/Output Summary (Last 24 hours) at 02/27/12 0724 Last data filed at 02/27/12 0600  Gross per 24 hour  Intake    675 ml  Output  2775 ml  Net  -2100 ml    Exam: Gen:  NAD Cardiovascular:  RRR, No M/R/G Respiratory:  Lungs with scattered rhonchi Gastrointestinal:  Abdomen soft, NT/ND, + BS Extremities:  2+ edema  Data Reviewed: Basic Metabolic Panel:  Lab 02/27/12 9562  02/26/12 0930 02/26/12 0459 02/25/12 0300 02/24/12 0325 02/23/12 0445  NA 131* -- 126* 131* 129* 128*  K 3.5 -- 3.8 -- -- --  CL 87* -- 88* 91* 89* 91*  CO2 35* -- 31 31 32 29  GLUCOSE 145* -- 124* 127* 112* 146*  BUN 18 -- 10 10 11 11   CREATININE 1.06 -- 0.85 0.84 0.80 0.76  CALCIUM 9.3 -- 8.8 8.7 8.4 8.5  MG 1.6 1.3* -- 1.7 1.0* --  PHOS -- -- -- -- -- --   GFR Estimated Creatinine Clearance: 64.3 ml/min (by C-G formula based on Cr of 1.06). Liver Function Tests:  Lab 02/27/12 0405  AST 30  ALT 26  ALKPHOS 150*  BILITOT 0.5  PROT 6.7  ALBUMIN 2.5*   Coagulation profile  Lab 02/27/12 0405 02/26/12 0459 02/25/12 0300 02/24/12 0325 02/23/12 0445  INR 5.08* 2.87* 2.71* 2.32* 1.82*  PROTIME -- -- -- -- --    CBC:  Lab 02/27/12 0405 02/26/12 0459 02/25/12 0300 02/24/12 0325 02/23/12 0445  WBC 8.1 7.7 7.3 8.1 7.7  NEUTROABS 7.5 -- -- -- --  HGB 8.9* 8.8* 8.7* 8.8* 8.7*  HCT 26.8* 26.8* 26.2* 26.5* 26.9*  MCV 90.8 91.5 91.0 90.8 91.8  PLT 164 156 155 138* 152   BNP (last 3 results)  Basename 02/27/12 0405 02/26/12 0459 02/19/12 1545  PROBNP 167.5* 258.3* 21.7   Anemia work up  [employer] 02/26/12 0930  VITAMINB12 206*  FOLATE 6.8  FERRITIN 622*  TIBC Not calculated due to Iron <10.  IRON <10*  RETICCTPCT --   Microbiology Recent Results (from the past 240 hour(s))  CULTURE, BLOOD (ROUTINE X 2)     Status: Normal   Collection Time   02/19/12 11:36 PM      Component Value Range Status Comment   Specimen Description BLOOD LEFT HAND   Final    Special Requests BOTTLES DRAWN AEROBIC AND ANAEROBIC 5CC   Final    Culture  Setup Time 02/20/2012 06:16   Final    Culture NO GROWTH 5 DAYS   Final    Report Status 02/26/2012 FINAL   Final   CULTURE, BLOOD (ROUTINE X 2)     Status: Normal   Collection Time   02/19/12 11:36 PM      Component Value Range Status Comment   Specimen Description BLOOD LEFT ANTECUBITAL   Final    Special Requests BOTTLES DRAWN AEROBIC AND  ANAEROBIC 5CC   Final    Culture  Setup Time 02/20/2012 06:16   Final    Culture NO GROWTH 5 DAYS   Final    Report Status 02/26/2012 FINAL   Final   CULTURE, BLOOD (ROUTINE X 2)     Status: Normal (Preliminary result)   Collection Time   02/23/12  9:05 AM      Component Value Range Status Comment   Specimen Description BLOOD LEFT ARM   Final    Special Requests BOTTLES DRAWN AEROBIC AND ANAEROBIC 5CC   Final    Culture  Setup Time 02/23/2012 15:06   Final    Culture     Final    Value:        BLOOD CULTURE RECEIVED NO  GROWTH TO DATE CULTURE WILL BE HELD FOR 5 DAYS BEFORE ISSUING A FINAL NEGATIVE REPORT   Report Status PENDING   Incomplete   CULTURE, BLOOD (ROUTINE X 2)     Status: Normal (Preliminary result)   Collection Time   02/23/12  9:11 AM      Component Value Range Status Comment   Specimen Description BLOOD RIGHT ARM   Final    Special Requests BOTTLES DRAWN AEROBIC AND ANAEROBIC 4CC   Final    Culture  Setup Time 02/23/2012 15:06   Final    Culture     Final    Value:        BLOOD CULTURE RECEIVED NO GROWTH TO DATE CULTURE WILL BE HELD FOR 5 DAYS BEFORE ISSUING A FINAL NEGATIVE REPORT   Report Status PENDING   Incomplete   URINE CULTURE     Status: Normal   Collection Time   02/24/12  1:34 AM      Component Value Range Status Comment   Specimen Description URINE, CLEAN CATCH   Final    Special Requests NONE   Final    Culture  Setup Time 02/24/2012 12:36   Final    Colony Count NO GROWTH   Final    Culture NO GROWTH   Final    Report Status 02/25/2012 FINAL   Final   MRSA PCR SCREENING     Status: Normal   Collection Time   02/26/12  2:43 PM      Component Value Range Status Comment   MRSA by PCR NEGATIVE  NEGATIVE Final      Procedures and Diagnostic Studies: Dg Chest 2 View  02/25/2012  *RADIOLOGY REPORT*  Clinical Data: Severe shortness of breath.  CHEST - 2 VIEW  Comparison: CT and plain films of the chest 02/19/2012.  Findings: There has been worsening of  airspace disease with markedly increased opacity seen in the right upper lobe.  Bibasilar airspace opacities also shows some progression.  There [name] cardiomegaly.  Chronic interstitial change again noted.  IMPRESSION: Worsened airspace disease, particularly in the right upper lobe, compatible with progressive pneumonia.   Original Report Authenticated By: Holley Dexter, M.D.    Dg Chest 2 View  02/19/2012  *RADIOLOGY REPORT*  Clinical Data: Shortness of breath, cough.  CHEST - 2 VIEW  Comparison: 09/27/2011  Findings: Right Port-A-Cath remains in place, unchanged. Cardiomegaly.  Increasing interstitial prominence throughout the lungs, likely interstitial edema.  Increasing bibasilar densities, likely atelectasis.  No effusions.  No acute bony abnormality.  IMPRESSION: Increasing interstitial prominence, likely edema and bibasilar atelectasis.   Original Report Authenticated By: Charlett Nose, M.D.    Ct Angio Chest W/cm &/or Wo Cm  02/19/2012  *RADIOLOGY REPORT*  Clinical Data: Tachycardia, shortness of breath.  History breast carcinoma.  The  CT ANGIOGRAPHY CHEST  Technique:  Multidetector CT imaging of the chest using the standard protocol during bolus administration of intravenous contrast. Multiplanar reconstructed images including MIPs were obtained and reviewed to evaluate the vascular anatomy.  Contrast: OMNIPAQUE IOHEXOL 350 MG/ML SOLN  Comparison: 09/27/2011  Findings: There [name] good contrast opacification of the pulmonary artery branches.  No discrete filling defect to suggest acute PE.Patient breathing during the acquisition degrades multiple images. Adequate contrast opacification of the thoracic aorta with no evidence of dissection, aneurysm, or stenosis. There [name] classic 3-vessel brachiocephalic arch anatomy.  Right port catheter extends to distal SVC.  No pleural or pericardial effusion.  Stable sub centimeter anterior mediastinal  and retrocrural lymph nodes.  No hilar adenopathy.  Probable  central right subclavian vein short segment occlusion or stenosis with opacification of multiple body wall and mediastinal collateral venous channels.  Progressive fibrotic changes in both lung bases with bronchiectasis.  There are coarse fibrotic changes in both upper lobes with pleural-based areas of coarse consolidation, also progressive since the prior study.  Decreased lung volumes. Patchy somewhat geographic ground- glass opacities throughout the remainder the lung fields. Exuberant flowing osteophytes across multiple contiguous levels in the mid and lower thoracic spine.  IMPRESSION:  1.  Negative for acute PE or thoracic aortic dissection. 2.  Progressive pulmonary parenchymal disease as above.   Original Report Authenticated By: D. Andria Rhein, MD    Dg Chest Port 1 View  02/26/2012  *RADIOLOGY REPORT*  Clinical Data: Worsening shortness of breath; difficulty breathing.  PORTABLE CHEST - 1 VIEW  Comparison: Chest radiograph performed 02/25/2012  Findings: Diffuse bilateral airspace opacification [name] noted, perhaps slightly worsened from the prior study.  This [name] concerning for mildly worsening multifocal pneumonia.  No pleural effusion or pneumothorax [name] seen.  The cardiomediastinal silhouette [name] mildly enlarged.  No acute osseous abnormalities are identified.  IMPRESSION:  1.  Diffuse bilateral airspace opacification, perhaps slightly worsened from the prior study.  This [name] concerning for severe mildly worsening multifocal pneumonia. 2.  Mild cardiomegaly.   Original Report Authenticated By: Tonia Ghent, M.D.     Scheduled Meds:   . amLODipine  5 mg Oral Daily  . cholecalciferol  3,000 Units Oral Daily  . furosemide  60 mg Intravenous Q12H  . levofloxacin (LEVAQUIN) IV  750 mg Intravenous Q24H  . methylPREDNISolone (SOLU-MEDROL) injection  80 mg Intravenous BID  . metoprolol succinate  200 mg Oral Daily  . oseltamivir  75 mg Oral BID  . piperacillin-tazobactam (ZOSYN)  IV  3.375 g  Intravenous Q8H  . potassium chloride SA  20 mEq Oral Daily  . sodium chloride  3 mL Intravenous Q12H  . vancomycin  1,250 mg Intravenous Q12H  . Warfarin - Pharmacist Dosing Inpatient   Does not apply q1800   Continuous Infusions:   Time spent: 35 minutes.   LOS: 8 days   Leiliana Foody  [facility] Pager [phone].  If 8PM-8AM, please contact night-coverage at [email or web address], password [facility] 02/27/2012, 7:24 AM

## 2012-02-27 NOTE — Progress Notes (Signed)
ANTICOAGULATION CONSULT NOTE - Follow Up  Pharmacy Consult for Coumadin Indication: Right Subclavian DVT  Allergies  Allergen Reactions  . Lisinopril Cough  . Codeine Itching  . Oxycodone Nausea Only  . 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 Admitted 1/14 with SOB.  Patient reportedly on Coumadin 7.5mg  po daily.    Dose on 1/16 not charted as given.    INR SUPRAtherapeutic today. INR was trending up slowly prev 3 days but large jump overnight 2.87 to 5.08 (dose was reduced last pm to 5mg ).   Several reasons for jump may include: DI (levofloxacin/solumedrol), acute illness  Hgb low but stable, no documentation of bleeding.   Goal of Therapy:  INR 2-3 Monitor platelets by anticoagulation protocol: Yes   Plan:   No coumadin tonight  Daily PT/INR  Juliette Alcide, PharmD, BCPS.   Pager: [phone] 02/27/2012 9:46 AM

## 2012-02-27 NOTE — Progress Notes (Signed)
CRITICAL VALUE ALERT  Critical value received:  INR 5.08  Date of notification:  02/27/12  Time of notification:  0500  Critical value read back:yes  Nurse who received alert:  Carlos Levering   MD notified (1st page):  elink nurse, Loraine Leriche  Time of first page:  0510   MD notified (2nd page):  Time of second page:  Responding MD:  elink  Time MD responded:  [phone]

## 2012-02-28 ENCOUNTER — Inpatient Hospital Stay (HOSPITAL_COMMUNITY): Payer: Medicare Other

## 2012-02-28 DIAGNOSIS — D638 Anemia in other chronic diseases classified elsewhere: Secondary | ICD-10-CM

## 2012-02-28 DIAGNOSIS — E538 Deficiency of other specified B group vitamins: Secondary | ICD-10-CM

## 2012-02-28 DIAGNOSIS — J189 Pneumonia, unspecified organism: Secondary | ICD-10-CM

## 2012-02-28 LAB — PROTIME-INR: Prothrombin Time: 86.4 seconds — ABNORMAL HIGH (ref 11.6–15.2)

## 2012-02-28 LAB — BASIC METABOLIC PANEL
BUN: 31 mg/dL — ABNORMAL HIGH (ref 6–23)
CO2: 37 mEq/L — ABNORMAL HIGH (ref 19–32)
Calcium: 9.2 mg/dL (ref 8.4–10.5)
Creatinine, Ser: 1.18 mg/dL — ABNORMAL HIGH (ref 0.50–1.10)
Glucose, Bld: 139 mg/dL — ABNORMAL HIGH (ref 70–99)

## 2012-02-28 LAB — CBC
HCT: 27.3 % — ABNORMAL LOW (ref 36.0–46.0)
MCH: 29.9 pg (ref 26.0–34.0)
MCV: 90.7 fL (ref 78.0–100.0)
Platelets: 223 10*3/uL (ref 150–400)
RDW: 16.5 % — ABNORMAL HIGH (ref 11.5–15.5)

## 2012-02-28 LAB — TYPE AND SCREEN
ABO/RH(D): O POS
Antibody Screen: NEGATIVE

## 2012-02-28 MED ORDER — FUROSEMIDE 10 MG/ML IJ SOLN
60.0000 mg | Freq: Three times a day (TID) | INTRAMUSCULAR | Status: AC
  Administered 2012-02-28 – 2012-02-29 (×3): 60 mg via INTRAVENOUS
  Filled 2012-02-28: qty 6

## 2012-02-28 MED ORDER — POTASSIUM CHLORIDE CRYS ER 20 MEQ PO TBCR
20.0000 meq | EXTENDED_RELEASE_TABLET | Freq: Once | ORAL | Status: AC
  Administered 2012-02-28: 20 meq via ORAL
  Filled 2012-02-28: qty 1

## 2012-02-28 MED ORDER — VITAMIN K1 10 MG/ML IJ SOLN
10.0000 mg | Freq: Once | INTRAVENOUS | Status: AC
  Administered 2012-02-28: 10 mg via INTRAVENOUS
  Filled 2012-02-28: qty 1

## 2012-02-28 MED ORDER — POTASSIUM CHLORIDE CRYS ER 20 MEQ PO TBCR
30.0000 meq | EXTENDED_RELEASE_TABLET | Freq: Three times a day (TID) | ORAL | Status: AC
  Administered 2012-02-28 (×3): 30 meq via ORAL
  Filled 2012-02-28 (×3): qty 1

## 2012-02-28 MED ORDER — PHYTONADIONE 5 MG PO TABS
10.0000 mg | ORAL_TABLET | Freq: Once | ORAL | Status: AC
  Administered 2012-02-28: 10 mg via ORAL
  Filled 2012-02-28: qty 2

## 2012-02-28 NOTE — Progress Notes (Signed)
ANTICOAGULATION CONSULT NOTE - Follow Up  Pharmacy Consult for Coumadin Indication: Right Subclavian DVT  Allergies  Allergen Reactions  . Lisinopril Cough  . Codeine Itching  . Oxycodone Nausea Only  . 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 Admitted 1/14 with SOB.  Patient reportedly on Coumadin 7.5mg  po daily.    Dose on 1/16 not charted as given.    INR SUPRAtherapeutic at >10. INR was trending up appropriately until jump to 5 on 1/22.    Several reasons for jump may include: DI (levofloxacin-now stopped/solumedrol), acute illness  Hgb low but stable, no documentation of bleeding.   Goal of Therapy:  INR 2-3 Monitor platelets by anticoagulation protocol: Yes   Plan:   No coumadin tonight  Recommend 5mg  of Vit K PO  Daily PT/INR  Juliette Alcide, PharmD, BCPS.   Pager: [phone] 02/28/2012 8:50 AM

## 2012-02-28 NOTE — Progress Notes (Signed)
Continues on NRB.; sats 96. Decreased WOB noted. She declined Bipap,  Excellent urinary output post IV Lasix. Remains oxygen dependent with little reserve.

## 2012-02-28 NOTE — Progress Notes (Signed)
NAME:  Abigail [name], FUENTE NO.:  [id number]    HISTORY OF PRESENT ILLNESS:  The patient is a very pleasant 57 year old female who PCCM has been asked to see for an abnormal CT of chest as well as worsening dyspnea on exertion. 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  She most recently had a round of chemotherapy the beginning of this month.    She is known to have non specific ILD findings 2009-> 2013 that were stable and  with calcified hilar nodes. In June 2013 when last seen by pccm in office she had dyspnea out of proportion to CT findings. Dyspnea felt due to be deconditoning, tachycardia (ultimate cardiac wokrup negative), obesity as prime factors. Summer 2013- PE was also ruled out. After this she did not fu with PCCM; a PFT had been scheduled  Cultures Influenza A 1/21 - NEG Influenza B 1/21 - NEG H1N1 1/21 - NEG Urine Culture - 1/19 - No Growth Blood Culturex2 1/18>>>  Antibiotics Zosyn 1/17>>>1/22 Vancomycin 1/20>>>1/22 Levofloxacin 1/21>>>1/22 Ceftaz 1/23>>> azithro 1/23>>>  Interval hx  She continues to improve clinically but her oxygen requirements are high.  She has been on facemask - 70% and not cycling on/off  NIV-Mask.    PHYSICAL EXAMINATION:   BP 128/84  Pulse 101  Temp 97.5 F (36.4 C) (Axillary)  Resp 32  Ht 5\' 1"  (1.549 m)  Wt 101 kg (222 lb 10.6 oz)  BMI 42.07 kg/m2  SpO2 95%  Intake/Output Summary (Last 24 hours) at 02/28/12 0900 Last data filed at 02/28/12 0800  Gross per 24 hour  Intake    776 ml  Output   1775 ml  Net   -999 ml  70% FM (one flap off NRBM)  HEENT:  Pupils are equal, round, and reactive to light.  Extraocular muscles are intact.  Oropharynx is clear. CHEST:  Crackles, diminished in bases.  CARDIAC: regular rhythm, no murmurs, rubs, or gallops. ABDOMEN:  Soft, nontender with hypoactive bowel sounds. EXTREMITIES:  Lower extremities with  mild edema and weak peripheral pulses, no cyanosis. NEUROLOGIC:  She is alert and oriented x4, and moves all four extremities.  No focal deficits.   Lab 02/28/12 0345 02/27/12 0405 02/26/12 0459  NA 134* 131* 126*  K 3.3* 3.5 3.8  CL 88* 87* 88*  CO2 37* 35* 31  BUN 31* 18 10  CREATININE 1.18* 1.06 0.85  GLUCOSE 139* 145* 124*    Lab 02/28/12 0345 02/27/12 0405 02/26/12 0459  HGB 9.0* 8.9* 8.8*  HCT 27.3* 26.8* 26.8*  WBC 10.7* 8.1 7.7  PLT 223 164 156   Lab Results  Component Value Date   INR >10.00* 02/28/2012   INR 5.08* 02/27/2012   INR 2.87* 02/26/2012   PROTIME 21.6* 02/07/2012   PROTIME 24.0* 01/11/2012   PROTIME 25.2* 12/14/2011   CBG (last 3)  No results found for this basename: GLUCAP:3 in the last 72 hours  Lab 02/28/12 0345 02/27/12 0405 02/26/12 1420 02/26/12 0459 02/25/12 0300  PROCALCITON 0.18 0.18 0.18 -- --  WBC 10.7* 8.1 -- 7.7 7.3  LATICACIDVEN -- -- -- -- --   Erythrocyte Sedimentation Rate     Component Value Date/Time   ESRSEDRATE 90* 02/23/2012 1749  ANA: neg RF: neg Influenza pcr: neg   PCXR: No worsening or significant change  IMPRESSION:   Pulmonary Progressive  acute respiratory failure in the setting of worsening pulmonary infiltrates. ? Chemo related pneumonitis ? Volume overload: seems doubtful  ? Infectious vs auto-immune: autoimmune neg to date.  Cont to improve clinically improved with steroid administration which would support pneumonitis, but remains on high O2 requirments  Plan: Cont antibiotics for possible infection (see below) Cont Diurese as tolerated Cont Steroids  Daily cxr Wean FiO2 to 50% if tolerated.  If patient does not tolerate weaning O2,  cycle NIV mask 4 hours on 2 hours off -  Keep SPO2>90% Mobilize  Cardiac Hypertension - Controlled on PO meds  Plan Cont PO meds  Infectious disease Possible RNA (NOS) WBC elevated - this is likely from steroid administration, influenza negative  PCT negative   Plan Trend WBC F/u cultures  Will complete 4d azithro and 4 d of ceftaz (as abx started on 1/17) assuming no further orgs identified.   Renal Mild hyponatremia, Mild hypochloremia: both improved.  Urine Osmo decreased Urine Cr and Na WNL Plan F/u chem Strict I&O  Endocrine  Thyroid function test WNL BG controlled, will check CBG q 4, add insulin if elevated.  Plan F/u chem  GI NPO Plan H2O sips/Ice chips as tolerated when NIV mask is off If O2 demands decrease she will be started on clear liquid diet   hematology  Anemia: Likely chronic disease. No evidence of bleeding. H&H stable DVT (right [place] vein): was on coumadin for this Coumadin induced coagulopathy: INR increased to >10 on 1/23 from 5 on 1/22 Fallopian tube carcinoma  Plan Administer 3 Units of FFP and Repeat INR after on 1/23   Neuro Intact  Plan Supportive care     Note by Anders Simmonds NP and NP student   02/28/2012 9:00 AM      STAFF NOTE: I, Dr Lavinia Sharps have personally reviewed patient's available data, including medical history, events of note, physical examination and test results as part of my evaluation. I have discussed with resident/NP and other care providers such as pharmacist, RN and RRT.  In addition,  I personally evaluated patient and elicited key findings of acute respiratory failure secondary to acute lung injury. She continues to slowly improve. She is currently on BiPAP he percent oxygen with PEEP of 5. Overnight she was 100% facemask. However her work of breathing seems adequate. We'll continue with current management. Of note, her INR was greater than 10 but there is no overt bleeding. So M.D. staff plan is to stop FFP and instead treat with oral vitamin K. We will check repeat INR several hours later today and again tomorrow. Rest per NP/medical resident whose note is outlined above and that I agree with  The patient is critically ill with multiple organ systems failure and  requires high complexity decision making for assessment and support, frequent evaluation and titration of therapies, application of advanced monitoring technologies and extensive interpretation of multiple databases.   Critical Care Time devoted to patient care services described in this note is  35  Minutes.  Dr. Kalman Shan, M.D., [facility].C.P Pulmonary and Critical Care Medicine Staff Physician [facility] [place] [facility] Pager: [phone], If no answer or between  15:00h - 7:00h: call [phone]  02/28/2012 11:08 AM

## 2012-02-28 NOTE — Progress Notes (Signed)
[facility] PROGRESS NOTE  Abigail [name] ZOX:[id number] DOB: [birth year] DOA: 02/19/2012 PCP: Carollee Herter, MD  Brief narrative: Abigail [name] is a 57 year old woman with a past medical history of pulmonary fibrosis, breast cancer, and fallopian tube cancer, last chemotherapy 02/07/2012, was admitted to the hospital on 02/19/2012 with acute respiratory failure. A CT angiogram was done on 02/19/2012 which showed progression of pulmonary fibrosis. The pulmonary team has been consulting.  Assessment/Plan: Principal Problem:  *Acute respiratory failure with hypoxia / ILD / shortness of breath  Multifactorial. Known interstitial lung disease. Autoimmune workup negative to date. ANA negative. Rheumatoid factor 14.  CCP less than 2. Extractable nuclear antigen antibodies all negative. Followup ANCA.  ACE levels WNL.  Was transferred to the ICU for progressive respiratory failure necessitating BiPAP.  Pulmonary on board. Felt to be too high risk for bronchoscopy at present time.  Patient is also being treated for diastolic congestive heart failure, possible acute respiratory distress syndrome, and healthcare associated pneumonia. 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  Patient had chemotherapy earlier this month and as such suspecting chemotherapy pneumonitis.  Currently improving on this regimen.  Active Problems:  Healthcare associated pneumonia  Procalcitonin not elevated, so sepsis felt to be unlikely. Blood cultures negative to date.  Influenza panel negative. Strep pneumonia antigen negative. Legionella antigen negative as well  Vanc stopped as no MRSA identified, Levaquin discontinued due to rising INR.  Currently on Azithromycin and Ceftaz started 02/27/12.  Today is day # 9 of antibiotics.  3 more days of azithro and Ceftaz per pulmonary recs.  Patient afebrile and WBC only mildly elevated although patient is on Solumedrol 80 mg IV BID  Fallopian tube cancer, BRCA2  positive  Followup with oncology post discharge.  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 May also be a reflection of congestive heart failure.  Urine osmolality low at 276. Urine sodium 103. Serum osmolality 266. 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  Sodium trending up over time. Improved with diuresis.  Normocytic Anemia / vitamin B 12 deficiency / anemia of chronic disease  Vitamin B 12 level low at 206. We'll place on vitamin B12 supplementation.  Serum folate normal at 6.8. Low iron at less than 10. Urine iron binding capacity 212, ferritin high at 622  Sinus tachycardia  Patient has been worked up for this as outpatient with cardiologist and no specific cause has been found. 2-D echo done in July 2013 was showing EF of 60-65%. Continue increased dose of metoprolol.   Thyroid function tests normal on 02/20/2012.   No events on telemetry, CTA negative.  DVT (deep venous thrombosis)  Continue Coumadin per pharmacy.  Hypomagnesemia / Hypokalemia  Monitor and replete as needed.  Replaced K today with oral regimen kdur 20 meq.   Acute on chronic diastolic congestive heart failure  Diuresed. ProBNP 167.5 on last check  Code Status: Full. Family Communication: None at bedside. Disposition Plan: Home when stable.   Medical Consultants:  Dr. Marcelyn Bruins, PCCM  Other Consultants:  Physical therapy: Home health PT versus SNF, [facility] aide.  Anti-infectives: Antibiotics:  Levaquin 1/14 >> 1/17  Vancomycin 1/17 >> 1/22 Zosyn 1/17 >> 1/21 Levaquin 02/26/2012>>1/22  HPI/Subjective: Patient at this juncture feeling better.  No acute issues overnight reported to me.  No new complaints.  Objective: Filed Vitals:   02/28/12 0100 02/28/12 0200 02/28/12 0400 02/28/12 0800  BP:  128/87 127/87   Pulse: 105 95 96   Temp:   97.4 F (36.3  C) 97.5 F (36.4 C)  TempSrc:   Oral Axillary  Resp: 27 20 24    Height:      Weight:  101 kg (222 lb 10.6 oz)    SpO2:  95% 98% 94%     Intake/Output Summary (Last 24 hours) at 02/28/12 1610 Last data filed at 02/28/12 0700  Gross per 24 hour  Intake    770 ml  Output   1775 ml  Net  -1005 ml    Exam: Gen:  NAD, awake and alert Cardiovascular:  RRR, No M/R/G Respiratory:  Lungs with scattered rhonchi more at bases, Breath sounds BL Gastrointestinal:  Abdomen soft, NT/ND, + BS Extremities:  2+ edema  Data Reviewed: Basic Metabolic Panel:  Lab 02/28/12 9604 02/27/12 0405 02/26/12 0930 02/26/12 0459 02/25/12 0300 02/24/12 0325  NA 134* 131* -- 126* 131* 129*  K 3.3* 3.5 -- -- -- --  CL 88* 87* -- 88* 91* 89*  CO2 37* 35* -- 31 31 32  GLUCOSE 139* 145* -- 124* 127* 112*  BUN 31* 18 -- 10 10 11   CREATININE 1.18* 1.06 -- 0.85 0.84 0.80  CALCIUM 9.2 9.3 -- 8.8 8.7 8.4  MG -- 1.6 1.3* -- 1.7 1.0*  PHOS -- -- -- -- -- --   GFR Estimated Creatinine Clearance: 56.7 ml/min (by C-G formula based on Cr of 1.18). Liver Function Tests:  Lab 02/27/12 0405  AST 30  ALT 26  ALKPHOS 150*  BILITOT 0.5  PROT 6.7  ALBUMIN 2.5*   Coagulation profile  Lab 02/28/12 0345 02/27/12 0405 02/26/12 0459 02/25/12 0300 02/24/12 0325  INR >10.00* 5.08* 2.87* 2.71* 2.32*  PROTIME -- -- -- -- --    CBC:  Lab 02/28/12 0345 02/27/12 0405 02/26/12 0459 02/25/12 0300 02/24/12 0325  WBC 10.7* 8.1 7.7 7.3 8.1  NEUTROABS -- 7.5 -- -- --  HGB 9.0* 8.9* 8.8* 8.7* 8.8*  HCT 27.3* 26.8* 26.8* 26.2* 26.5*  MCV 90.7 90.8 91.5 91.0 90.8  PLT 223 164 156 155 138*   BNP (last 3 results)  Basename 02/27/12 0405 02/26/12 0459 02/19/12 1545  PROBNP 167.5* 258.3* 21.7   Anemia work up  [employer] 02/26/12 0930  VITAMINB12 206*  FOLATE 6.8  FERRITIN 622*  TIBC Not calculated due to Iron <10.  IRON <10*  RETICCTPCT --   Microbiology Recent Results (from the past 240 hour(s))  CULTURE, BLOOD (ROUTINE X 2)     Status: Normal   Collection Time   02/19/12 11:36 PM      Component Value Range Status Comment   Specimen  Description BLOOD LEFT HAND   Final    Special Requests BOTTLES DRAWN AEROBIC AND ANAEROBIC 5CC   Final    Culture  Setup Time 02/20/2012 06:16   Final    Culture NO GROWTH 5 DAYS   Final    Report Status 02/26/2012 FINAL   Final   CULTURE, BLOOD (ROUTINE X 2)     Status: Normal   Collection Time   02/19/12 11:36 PM      Component Value Range Status Comment   Specimen Description BLOOD LEFT ANTECUBITAL   Final    Special Requests BOTTLES DRAWN AEROBIC AND ANAEROBIC 5CC   Final    Culture  Setup Time 02/20/2012 06:16   Final    Culture NO GROWTH 5 DAYS   Final    Report Status 02/26/2012 FINAL   Final   CULTURE, BLOOD (ROUTINE X 2)     Status: Normal (  Preliminary result)   Collection Time   02/23/12  9:05 AM      Component Value Range Status Comment   Specimen Description BLOOD LEFT ARM   Final    Special Requests BOTTLES DRAWN AEROBIC AND ANAEROBIC 5CC   Final    Culture  Setup Time 02/23/2012 15:06   Final    Culture     Final    Value:        BLOOD CULTURE RECEIVED NO GROWTH TO DATE CULTURE WILL BE HELD FOR 5 DAYS BEFORE ISSUING A FINAL NEGATIVE REPORT   Report Status PENDING   Incomplete   CULTURE, BLOOD (ROUTINE X 2)     Status: Normal (Preliminary result)   Collection Time   02/23/12  9:11 AM      Component Value Range Status Comment   Specimen Description BLOOD RIGHT ARM   Final    Special Requests BOTTLES DRAWN AEROBIC AND ANAEROBIC 4CC   Final    Culture  Setup Time 02/23/2012 15:06   Final    Culture     Final    Value:        BLOOD CULTURE RECEIVED NO GROWTH TO DATE CULTURE WILL BE HELD FOR 5 DAYS BEFORE ISSUING A FINAL NEGATIVE REPORT   Report Status PENDING   Incomplete   URINE CULTURE     Status: Normal   Collection Time   02/24/12  1:34 AM      Component Value Range Status Comment   Specimen Description URINE, CLEAN CATCH   Final    Special Requests NONE   Final    Culture  Setup Time 02/24/2012 12:36   Final    Colony Count NO GROWTH   Final    Culture NO GROWTH    Final    Report Status 02/25/2012 FINAL   Final   MRSA PCR SCREENING     Status: Normal   Collection Time   02/26/12  2:43 PM      Component Value Range Status Comment   MRSA by PCR NEGATIVE  NEGATIVE Final      Procedures and Diagnostic Studies: Dg Chest 2 View  02/25/2012  *RADIOLOGY REPORT*  Clinical Data: Severe shortness of breath.  CHEST - 2 VIEW  Comparison: CT and plain films of the chest 02/19/2012.  Findings: There has been worsening of airspace disease with markedly increased opacity seen in the right upper lobe.  Bibasilar airspace opacities also shows some progression.  There is cardiomegaly.  Chronic interstitial change again noted.  IMPRESSION: Worsened airspace disease, particularly in the right upper lobe, compatible with progressive pneumonia.   Original Report Authenticated By: Holley Dexter, M.D.    Dg Chest 2 View  02/19/2012  *RADIOLOGY REPORT*  Clinical Data: Shortness of breath, cough.  CHEST - 2 VIEW  Comparison: 09/27/2011  Findings: Right Port-A-Cath remains in place, unchanged. Cardiomegaly.  Increasing interstitial prominence throughout the lungs, likely interstitial edema.  Increasing bibasilar densities, likely atelectasis.  No effusions.  No acute bony abnormality.  IMPRESSION: Increasing interstitial prominence, likely edema and bibasilar atelectasis.   Original Report Authenticated By: Charlett Nose, M.D.    Ct Angio Chest W/cm &/or Wo Cm  02/19/2012  *RADIOLOGY REPORT*  Clinical Data: Tachycardia, shortness of breath.  History breast carcinoma.  The  CT ANGIOGRAPHY CHEST  Technique:  Multidetector CT imaging of the chest using the standard protocol during bolus administration of intravenous contrast. Multiplanar reconstructed images including MIPs were obtained and reviewed to evaluate the vascular  anatomy.  Contrast: OMNIPAQUE IOHEXOL 350 MG/ML SOLN  Comparison: 09/27/2011  Findings: There is good contrast opacification of the pulmonary artery branches.  No  discrete filling defect to suggest acute PE.Patient breathing during the acquisition degrades multiple images. Adequate contrast opacification of the thoracic aorta with no evidence of dissection, aneurysm, or stenosis. There is classic 3-vessel brachiocephalic arch anatomy.  Right port catheter extends to distal SVC.  No pleural or pericardial effusion.  Stable sub centimeter anterior mediastinal and retrocrural lymph nodes.  No hilar adenopathy.  Probable central right subclavian vein short segment occlusion or stenosis with opacification of multiple body wall and mediastinal collateral venous channels.  Progressive fibrotic changes in both lung bases with bronchiectasis.  There are coarse fibrotic changes in both upper lobes with pleural-based areas of coarse consolidation, also progressive since the prior study.  Decreased lung volumes. Patchy somewhat geographic ground- glass opacities throughout the remainder the lung fields. Exuberant flowing osteophytes across multiple contiguous levels in the mid and lower thoracic spine.  IMPRESSION:  1.  Negative for acute PE or thoracic aortic dissection. 2.  Progressive pulmonary parenchymal disease as above.   Original Report Authenticated By: D. Andria Rhein, MD    Dg Chest Port 1 View  02/26/2012  *RADIOLOGY REPORT*  Clinical Data: Worsening shortness of breath; difficulty breathing.  PORTABLE CHEST - 1 VIEW  Comparison: Chest radiograph performed 02/25/2012  Findings: Diffuse bilateral airspace opacification is noted, perhaps slightly worsened from the prior study.  This is concerning for mildly worsening multifocal pneumonia.  No pleural effusion or pneumothorax is seen.  The cardiomediastinal silhouette is mildly enlarged.  No acute osseous abnormalities are identified.  IMPRESSION:  1.  Diffuse bilateral airspace opacification, perhaps slightly worsened from the prior study.  This is concerning for severe mildly worsening multifocal pneumonia. 2.  Mild  cardiomegaly.   Original Report Authenticated By: Tonia Ghent, M.D.     Scheduled Meds:    . amLODipine  5 mg Oral Daily  . antiseptic oral rinse  15 mL Mouth Rinse q12n4p  . azithromycin  500 mg Intravenous Q24H  . cefTAZidime (FORTAZ)  IV  1 g Intravenous Q8H  . chlorhexidine  15 mL Mouth Rinse BID  . cholecalciferol  3,000 Units Oral Daily  . furosemide  60 mg Intravenous Q12H  . methylPREDNISolone (SOLU-MEDROL) injection  80 mg Intravenous BID  . metoprolol succinate  200 mg Oral Daily  . potassium chloride SA  20 mEq Oral Daily  . sodium chloride  3 mL Intravenous Q12H  . Warfarin - Pharmacist Dosing Inpatient   Does not apply q1800   Continuous Infusions:   Time spent: 35 minutes.   LOS: 9 days   Penny Pia  [facility] Pager [phone] If 8PM-8AM, please contact night-coverage at [email or web address], password [facility] 02/28/2012, 8:21 AM

## 2012-02-28 NOTE — Progress Notes (Signed)
INR 10 this am. No Coumadin was given on 1/22. No signs of bleeding noted. VS stable. Pharmacy and Dr. Darrick Penna, [place], informed. Potassium 3.3 this am.

## 2012-02-28 NOTE — Progress Notes (Signed)
CRITICAL VALUE ALERT  Critical value received:  INR >10  Date of notification:  02/28/12  Time of notification:  16:55  Critical value read back:yes  Nurse who received alert:  Haskell Flirt, RN  MD notified (1st page):  Dr. Vassie Loll  Time of first page:  16:55  MD notified (2nd page):  Time of second page:  Responding MD:  Dr. Vassie Loll  Time MD responded:  16:55

## 2012-02-28 NOTE — Progress Notes (Signed)
eLink Physician-Brief Progress Note Patient Name: Abigail [name] DOB: 06-15-1955 MRN: [id number]  Date of Service  02/28/2012   HPI/Events of Note     eICU Interventions  Vit K IV x 1 for INR >10   Intervention Category Intermediate Interventions: Diagnostic test evaluation  ALVA,RAKESH V. 02/28/2012, 4:55 PM

## 2012-02-28 NOTE — Progress Notes (Signed)
eLink Physician-Brief Progress Note Patient Name: Abigail [name] DOB: 1955/10/24 MRN: [id number]  Date of Service  02/28/2012   HPI/Events of Note  hypokalemia   eICU Interventions  Potassium replaced   Intervention Category Minor Interventions: Electrolytes abnormality - evaluation and management  DETERDING,ELIZABETH 02/28/2012, 5:00 AM

## 2012-02-29 ENCOUNTER — Inpatient Hospital Stay (HOSPITAL_COMMUNITY): Payer: Medicare Other

## 2012-02-29 LAB — CBC
HCT: 27.6 % — ABNORMAL LOW (ref 36.0–46.0)
MCH: 29.8 pg (ref 26.0–34.0)
MCV: 90.5 fL (ref 78.0–100.0)
Platelets: 245 10*3/uL (ref 150–400)
RBC: 3.05 MIL/uL — ABNORMAL LOW (ref 3.87–5.11)
RDW: 16.7 % — ABNORMAL HIGH (ref 11.5–15.5)
WBC: 10.7 10*3/uL — ABNORMAL HIGH (ref 4.0–10.5)

## 2012-02-29 LAB — CULTURE, BLOOD (ROUTINE X 2)

## 2012-02-29 LAB — PNEUMOCYSTIS JIROVECI SMEAR BY DFA: Pneumocystis jiroveci Ag: NEGATIVE

## 2012-02-29 LAB — BASIC METABOLIC PANEL
CO2: 37 mEq/L — ABNORMAL HIGH (ref 19–32)
Calcium: 9.3 mg/dL (ref 8.4–10.5)
Chloride: 91 mEq/L — ABNORMAL LOW (ref 96–112)
Creatinine, Ser: 1.18 mg/dL — ABNORMAL HIGH (ref 0.50–1.10)
Glucose, Bld: 153 mg/dL — ABNORMAL HIGH (ref 70–99)

## 2012-02-29 LAB — PREPARE FRESH FROZEN PLASMA
Unit division: 0
Unit division: 0

## 2012-02-29 LAB — BODY FLUID CELL COUNT WITH DIFFERENTIAL: Eos, Fluid: 0 %

## 2012-02-29 LAB — PROTIME-INR: INR: 1.47 (ref 0.00–1.49)

## 2012-02-29 MED ORDER — PHENYLEPHRINE HCL 0.25 % NA SOLN
1.0000 | Freq: Four times a day (QID) | NASAL | Status: DC | PRN
  Administered 2012-02-29: 1 via NASAL
  Filled 2012-02-29: qty 15

## 2012-02-29 MED ORDER — LIDOCAINE HCL 1 % IJ SOLN
6.0000 mL | Freq: Once | INTRAMUSCULAR | Status: AC
  Administered 2012-02-29: 6 mL via RESPIRATORY_TRACT
  Filled 2012-02-29: qty 6

## 2012-02-29 MED ORDER — WARFARIN SODIUM 2.5 MG PO TABS
2.5000 mg | ORAL_TABLET | Freq: Once | ORAL | Status: AC
  Administered 2012-02-29: 2.5 mg via ORAL
  Filled 2012-02-29: qty 1

## 2012-02-29 MED ORDER — FENTANYL CITRATE 0.05 MG/ML IJ SOLN
INTRAMUSCULAR | Status: AC
  Filled 2012-02-29: qty 4

## 2012-02-29 MED ORDER — MIDAZOLAM HCL 5 MG/ML IJ SOLN
INTRAMUSCULAR | Status: AC
  Filled 2012-02-29: qty 2

## 2012-02-29 MED ORDER — FENTANYL CITRATE 0.05 MG/ML IJ SOLN
25.0000 ug | Freq: Once | INTRAMUSCULAR | Status: AC
  Administered 2012-02-29: 25 ug via INTRAVENOUS

## 2012-02-29 MED ORDER — MIDAZOLAM HCL 5 MG/ML IJ SOLN
1.0000 mg | Freq: Once | INTRAMUSCULAR | Status: AC
  Administered 2012-02-29: 1 mg via INTRAVENOUS

## 2012-02-29 MED ORDER — LIDOCAINE HCL 2 % EX GEL
Freq: Once | CUTANEOUS | Status: AC
  Administered 2012-02-29: 5
  Filled 2012-02-29: qty 5

## 2012-02-29 NOTE — Progress Notes (Signed)
Dr. Kalman Shan, M.D., [facility].C.P Pulmonary and Critical Care Medicine Staff Physician [facility] [place] [facility] Pager: [phone], If no answer or between  15:00h - 7:00h: call [phone]  02/29/2012 11:48 AM

## 2012-02-29 NOTE — Progress Notes (Signed)
ANTICOAGULATION CONSULT NOTE   Pharmacy Consult for Coumadin Indication: Right Subclavian DVT  Allergies  Allergen Reactions  . Lisinopril Cough  . Codeine Itching  . Oxycodone Nausea Only  . 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 Admitted 1/14 with SOB.  Coumadin was continued on admission with pharmacy dosing and daily INR  On 1/22 INR rose sharply, then continued to rise despite withholding warfarin.  INR >10 on 1/23 - vitamin K 10mg  PO and 10mg  IV were administered  INR today 1.47.  Hgb low but stable, no documentation of bleeding.   Goal of Therapy:  INR 2-3    Plan:   Cautiously resume warfarin with 2.5 mg PO x 1 today  Anticipate a delay in full INR response for at least several days due to recent administration of vitamin K. Suggest consideration of adding Lovenox or unfractionated heparin if immediate anticoagulant coverage is indicated.  Elie Goody, PharmD, BCPS Pager: [phone] 02/29/2012  11:43 AM

## 2012-02-29 NOTE — Progress Notes (Signed)
[facility] PROGRESS NOTE  Abigail [name] ONG:[id number] DOB: 1955-06-29 DOA: 02/19/2012 PCP: Abigail Herter, MD  Brief narrative: Mrs. Cancel is a 57 year old woman with a past medical history of pulmonary fibrosis, breast cancer, and fallopian tube cancer, last chemotherapy 02/07/2012, was admitted to the hospital on 02/19/2012 with acute respiratory failure. A CT angiogram was done on 02/19/2012 which showed progression of pulmonary fibrosis. The pulmonary team has been consulting.  Assessment/Plan: Principal Problem:  *Acute respiratory failure with hypoxia / ILD / shortness of breath  Multifactorial. Known interstitial lung disease. Autoimmune workup negative to date. ANA negative. Rheumatoid factor 14.  CCP less than 2. Extractable nuclear antigen antibodies all negative. Followup ANCA.  ACE levels WNL.  Was transferred to the ICU for progressive respiratory failure necessitating BiPAP.  Pulmonary on board. Felt to be too high risk for bronchoscopy at present time.  Patient is also being treated for diastolic congestive heart failure, possible acute respiratory distress syndrome, and healthcare associated pneumonia. 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 Patient had chemotherapy earlier this month and as such suspecting chemotherapy pneumonitis.  Is improving on current drug regimen - Disposition given that problem is mainly pulmonary will await recommendations regarding disposition from Pulmonologist.  Although this will mainly depend on continued improvement in condition.  Active Problems:  Healthcare associated pneumonia  Procalcitonin not elevated, so sepsis felt to be unlikely. Blood cultures negative to date.  Influenza panel negative. Strep pneumonia antigen negative. Legionella antigen negative as well  Vanc stopped as no MRSA identified, Levaquin discontinued due to rising INR.  Currently on Azithromycin and Ceftaz started 02/27/12.  Today is day # 9 of  antibiotics.  2 more days of azithro and Ceftaz per pulmonary recs.  Patient afebrile and WBC only mildly elevated although patient is on Solumedrol 80 mg IV BID  Fallopian tube cancer, BRCA2 positive  Followup with oncology post discharge.  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 May also be a reflection of congestive heart failure.  Urine osmolality low at 276. Urine sodium 103. Serum osmolality 266. 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  Sodium trending up over time. Improved with diuresis.  Normocytic Anemia / vitamin B 12 deficiency / anemia of chronic disease  Vitamin B 12 level low at 206. We'll place on vitamin B12 supplementation.  Serum folate normal at 6.8. Low iron at less than 10. Urine iron binding capacity 212, ferritin high at 622  Sinus tachycardia  Patient has been worked up for this as outpatient with cardiologist and no specific cause has been found. 2-D echo done in July 2013 was showing EF of 60-65%. Continue increased dose of metoprolol.   Thyroid function tests normal on 02/20/2012.   No events on telemetry, CTA negative.  DVT (deep venous thrombosis)  Continue Coumadin per pharmacy.  Hypomagnesemia / Hypokalemia  Monitor and replete as needed.  Replaced K today with oral regimen kdur 20 meq.   Acute on chronic diastolic congestive heart failure  Diuresed. ProBNP 167.5 on last check  Code Status: Full. Family Communication: None at bedside. Disposition Plan: Home when stable.   Medical Consultants:  Dr. Marcelyn Bruins, PCCM  Other Consultants:  Physical therapy: Home health PT versus SNF, [facility] aide.  Anti-infectives: Antibiotics:  Levaquin 1/14 >> 1/17  Vancomycin 1/17 >> 1/22 Zosyn 1/17 >> 1/21 Levaquin 02/26/2012>>1/22  HPI/Subjective: No acute issues reported overnight.  Patient feels about the same today.  No new complaints  Objective: Filed Vitals:   02/29/12 0000 02/29/12  0400 02/29/12 0516 02/29/12 0800  BP: 133/92  131/85 131/85 124/64  Pulse: 90 90 80 95  Temp: 97.5 F (36.4 C) 97.7 F (36.5 C)  97.5 F (36.4 C)  TempSrc: Oral Oral  Oral  Resp: 28 31 27  32  Height:      Weight:      SpO2: 96% 92% 95% 93%    Intake/Output Summary (Last 24 hours) at 02/29/12 0845 Last data filed at 02/29/12 0800  Gross per 24 hour  Intake 2114.5 ml  Output   2000 ml  Net  114.5 ml    Exam: Gen:  NAD, awake and alert Cardiovascular:  RRR, No M/R/G Respiratory:  Lungs with scattered rhonchi more at bases, Breath sounds BL Gastrointestinal:  Abdomen soft, NT/ND, + BS Extremities:  2+ edema  Data Reviewed: Basic Metabolic Panel:  Lab 02/29/12 7829 02/28/12 0345 02/27/12 0405 02/26/12 0930 02/26/12 0459 02/25/12 0300 02/24/12 0325  NA 135 134* 131* -- 126* 131* --  K 4.0 3.3* -- -- -- -- --  CL 91* 88* 87* -- 88* 91* --  CO2 37* 37* 35* -- 31 31 --  GLUCOSE 153* 139* 145* -- 124* 127* --  BUN 40* 31* 18 -- 10 10 --  CREATININE 1.18* 1.18* 1.06 -- 0.85 0.84 --  CALCIUM 9.3 9.2 9.3 -- 8.8 8.7 --  MG -- -- 1.6 1.3* -- 1.7 1.0*  PHOS -- -- -- -- -- -- --   GFR Estimated Creatinine Clearance: 56.7 ml/min (by C-G formula based on Cr of 1.18). Liver Function Tests:  Lab 02/27/12 0405  AST 30  ALT 26  ALKPHOS 150*  BILITOT 0.5  PROT 6.7  ALBUMIN 2.5*   Coagulation profile  Lab 02/29/12 0615 02/28/12 1615 02/28/12 0345 02/27/12 0405 02/26/12 0459  INR 1.47 >10.00* >10.00* 5.08* 2.87*  PROTIME -- -- -- -- --    CBC:  Lab 02/29/12 0615 02/28/12 0345 02/27/12 0405 02/26/12 0459 02/25/12 0300  WBC 10.7* 10.7* 8.1 7.7 7.3  NEUTROABS -- -- 7.5 -- --  HGB 9.1* 9.0* 8.9* 8.8* 8.7*  HCT 27.6* 27.3* 26.8* 26.8* 26.2*  MCV 90.5 90.7 90.8 91.5 91.0  PLT 245 223 164 156 155   BNP (last 3 results)  Basename 02/27/12 0405 02/26/12 0459 02/19/12 1545  PROBNP 167.5* 258.3* 21.7   Anemia work up  [employer] 02/26/12 0930  VITAMINB12 206*  FOLATE 6.8  FERRITIN 622*  TIBC Not calculated due to Iron  <10.  IRON <10*  RETICCTPCT --   Microbiology Recent Results (from the past 240 hour(s))  CULTURE, BLOOD (ROUTINE X 2)     Status: Normal   Collection Time   02/19/12 11:36 PM      Component Value Range Status Comment   Specimen Description BLOOD LEFT HAND   Final    Special Requests BOTTLES DRAWN AEROBIC AND ANAEROBIC 5CC   Final    Culture  Setup Time 02/20/2012 06:16   Final    Culture NO GROWTH 5 DAYS   Final    Report Status 02/26/2012 FINAL   Final   CULTURE, BLOOD (ROUTINE X 2)     Status: Normal   Collection Time   02/19/12 11:36 PM      Component Value Range Status Comment   Specimen Description BLOOD LEFT ANTECUBITAL   Final    Special Requests BOTTLES DRAWN AEROBIC AND ANAEROBIC 5CC   Final    Culture  Setup Time 02/20/2012 06:16   Final  Culture NO GROWTH 5 DAYS   Final    Report Status 02/26/2012 FINAL   Final   CULTURE, BLOOD (ROUTINE X 2)     Status: Normal   Collection Time   02/23/12  9:05 AM      Component Value Range Status Comment   Specimen Description BLOOD LEFT ARM   Final    Special Requests BOTTLES DRAWN AEROBIC AND ANAEROBIC 5CC   Final    Culture  Setup Time 02/23/2012 15:06   Final    Culture NO GROWTH 5 DAYS   Final    Report Status 02/29/2012 FINAL   Final   CULTURE, BLOOD (ROUTINE X 2)     Status: Normal   Collection Time   02/23/12  9:11 AM      Component Value Range Status Comment   Specimen Description BLOOD RIGHT ARM   Final    Special Requests BOTTLES DRAWN AEROBIC AND ANAEROBIC 4CC   Final    Culture  Setup Time 02/23/2012 15:06   Final    Culture NO GROWTH 5 DAYS   Final    Report Status 02/29/2012 FINAL   Final   URINE CULTURE     Status: Normal   Collection Time   02/24/12  1:34 AM      Component Value Range Status Comment   Specimen Description URINE, CLEAN CATCH   Final    Special Requests NONE   Final    Culture  Setup Time 02/24/2012 12:36   Final    Colony Count NO GROWTH   Final    Culture NO GROWTH   Final    Report Status  02/25/2012 FINAL   Final   MRSA PCR SCREENING     Status: Normal   Collection Time   02/26/12  2:43 PM      Component Value Range Status Comment   MRSA by PCR NEGATIVE  NEGATIVE Final      Procedures and Diagnostic Studies: Dg Chest 2 View  02/25/2012  *RADIOLOGY REPORT*  Clinical Data: Severe shortness of breath.  CHEST - 2 VIEW  Comparison: CT and plain films of the chest 02/19/2012.  Findings: There has been worsening of airspace disease with markedly increased opacity seen in the right upper lobe.  Bibasilar airspace opacities also shows some progression.  There is cardiomegaly.  Chronic interstitial change again noted.  IMPRESSION: Worsened airspace disease, particularly in the right upper lobe, compatible with progressive pneumonia.   Original Report Authenticated By: Holley Dexter, M.D.    Dg Chest 2 View  02/19/2012  *RADIOLOGY REPORT*  Clinical Data: Shortness of breath, cough.  CHEST - 2 VIEW  Comparison: 09/27/2011  Findings: Right Port-A-Cath remains in place, unchanged. Cardiomegaly.  Increasing interstitial prominence throughout the lungs, likely interstitial edema.  Increasing bibasilar densities, likely atelectasis.  No effusions.  No acute bony abnormality.  IMPRESSION: Increasing interstitial prominence, likely edema and bibasilar atelectasis.   Original Report Authenticated By: Charlett Nose, M.D.    Ct Angio Chest W/cm &/or Wo Cm  02/19/2012  *RADIOLOGY REPORT*  Clinical Data: Tachycardia, shortness of breath.  History breast carcinoma.  The  CT ANGIOGRAPHY CHEST  Technique:  Multidetector CT imaging of the chest using the standard protocol during bolus administration of intravenous contrast. Multiplanar reconstructed images including MIPs were obtained and reviewed to evaluate the vascular anatomy.  Contrast: OMNIPAQUE IOHEXOL 350 MG/ML SOLN  Comparison: 09/27/2011  Findings: There is good contrast opacification of the pulmonary artery branches.  No discrete  filling defect  to suggest acute PE.Patient breathing during the acquisition degrades multiple images. Adequate contrast opacification of the thoracic aorta with no evidence of dissection, aneurysm, or stenosis. There is classic 3-vessel brachiocephalic arch anatomy.  Right port catheter extends to distal SVC.  No pleural or pericardial effusion.  Stable sub centimeter anterior mediastinal and retrocrural lymph nodes.  No hilar adenopathy.  Probable central right subclavian vein short segment occlusion or stenosis with opacification of multiple body wall and mediastinal collateral venous channels.  Progressive fibrotic changes in both lung bases with bronchiectasis.  There are coarse fibrotic changes in both upper lobes with pleural-based areas of coarse consolidation, also progressive since the prior study.  Decreased lung volumes. Patchy somewhat geographic ground- glass opacities throughout the remainder the lung fields. Exuberant flowing osteophytes across multiple contiguous levels in the mid and lower thoracic spine.  IMPRESSION:  1.  Negative for acute PE or thoracic aortic dissection. 2.  Progressive pulmonary parenchymal disease as above.   Original Report Authenticated By: D. Andria Rhein, MD    Dg Chest Port 1 View  02/26/2012  *RADIOLOGY REPORT*  Clinical Data: Worsening shortness of breath; difficulty breathing.  PORTABLE CHEST - 1 VIEW  Comparison: Chest radiograph performed 02/25/2012  Findings: Diffuse bilateral airspace opacification is noted, perhaps slightly worsened from the prior study.  This is concerning for mildly worsening multifocal pneumonia.  No pleural effusion or pneumothorax is seen.  The cardiomediastinal silhouette is mildly enlarged.  No acute osseous abnormalities are identified.  IMPRESSION:  1.  Diffuse bilateral airspace opacification, perhaps slightly worsened from the prior study.  This is concerning for severe mildly worsening multifocal pneumonia. 2.  Mild cardiomegaly.   Original Report  Authenticated By: Tonia Ghent, M.D.     Scheduled Meds:    . amLODipine  5 mg Oral Daily  . antiseptic oral rinse  15 mL Mouth Rinse q12n4p  . azithromycin  500 mg Intravenous Q24H  . cefTAZidime (FORTAZ)  IV  1 g Intravenous Q8H  . chlorhexidine  15 mL Mouth Rinse BID  . cholecalciferol  3,000 Units Oral Daily  . methylPREDNISolone (SOLU-MEDROL) injection  80 mg Intravenous BID  . metoprolol succinate  200 mg Oral Daily  . potassium chloride SA  20 mEq Oral Daily  . sodium chloride  3 mL Intravenous Q12H  . Warfarin - Pharmacist Dosing Inpatient   Does not apply q1800   Continuous Infusions:   Time spent: 35 minutes.   LOS: 10 days   Penny Pia  [facility] Pager [phone] If 8PM-8AM, please contact night-coverage at [email or web address], password [facility] 02/29/2012, 8:45 AM

## 2012-02-29 NOTE — Procedures (Signed)
Bedside Bronchoscopy Procedure Note Abigail [name] [id number] May 08, 1955  Procedure: Bronchoscopy Indications: Obtain specimens for culture and/or other diagnostic studies  Procedure Details: ET Tube Size n/a ET Tube secured at lip (cm):n/a Bite block in place: Yes In preparation for procedure, Patient hyper-oxygenated with 100 % FiO2 oral airway and nasal area preped Airway entered and the following bronchi were examined: Bronchi.   Pt placed on Bipap at end of procedure.   Evaluation BP 124/64  Pulse 89  Temp 97.5 F (36.4 C) (Oral)  Resp 21  Ht 5\' 1"  (1.549 m)  Wt 222 lb 10.6 oz (101 kg)  BMI 42.07 kg/m2  SpO2 99% Breath Sounds:Rhonch O2 sats: stable throughout Patient's Current Condition: stable Specimens:  BAL Complications: No apparent complications Patient did tolerate procedure well.  Specimens given to RN, MD putting in orders, Abigail [name] place pt on Bipap post procedure  Abigail [name] Abigail [name] 02/29/2012, 11:22 AM

## 2012-02-29 NOTE — Procedures (Signed)
Bronchoscopy Procedure Note Abigail [name] [id number] 1955-05-23  Procedure: Bronchoscopy Indications: Obtain specimens for culture and/or other diagnostic studies  Procedure Details Consent: Risks of procedure as well as the alternatives and risks of each were explained to the (patient/caregiver).  Consent for procedure obtained. Time Out: Verified patient identification, verified procedure, site/side was marked, verified correct patient position, special equipment/implants available, medications/allergies/relevent history reviewed, required imaging and test results available.  Performed  In preparation for procedure, patient was given 100% FiO2. Sedation: Benzodiazepines and fentanyl  Airway entered and the following bronchi were examined: RML.   Procedures performed: Brushings performed Bronchoscope removed.    Evaluation Hemodynamic Status: BP stable throughout; O2 sats: transiently fell during during procedure Patient's Current Condition: stable Specimens:  Sent purulent fluid Complications: No apparent complications Patient did tolerate procedure well.    FINDINGS - Bronch done for Acute Lung Injury nOS and no clinical improvement. She consented for procedure. Bronch passed through trachea and RML quicly entered. 30cc x 3 of saline BAL done with returns of greyish material that was between thick and thin. No evidence of PAP or  Alv Hge. Sent for analysis   Dr. Kalman Shan, M.D., [facility].C.P Pulmonary and Critical Care Medicine Staff Physician [facility]  Pulmonary and Critical Care Pager: [phone], If no answer or between  15:00h - 7:00h: call [phone]  02/29/2012 11:30 AM

## 2012-02-29 NOTE — Progress Notes (Signed)
Video Bronchoscopy done  Intervention Bronchial Washing done  Procedure tolerated well 

## 2012-02-29 NOTE — Progress Notes (Signed)
NAME:  Abigail [name], Abigail [name] NO.:  [id number]    HISTORY OF PRESENT ILLNESS:  The patient is a very pleasant 57 year old female who PCCM has been asked to see for an abnormal CT of chest as well as worsening dyspnea on exertion. 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  She most recently had a round of chemotherapy the beginning of this month.    She is known to have non specific ILD findings 2009-> 2013 that were stable and  with calcified hilar nodes. In June 2013 when last seen by pccm in office she had dyspnea out of proportion to CT findings. Dyspnea felt due to be deconditoning, tachycardia (ultimate cardiac wokrup negative), obesity as prime factors. Summer 2013- PE was also ruled out. After this she did not fu with PCCM; a PFT had been scheduled  Cultures Influenza A 1/21 - NEG Influenza B 1/21 - NEG H1N1 1/21 - NEG Urine Culture - 1/19 - No Growth Blood Culturex2 1/18>>>  Antibiotics Zosyn 1/17>>>1/22 Vancomycin 1/20>>>1/22 Levofloxacin 1/21>>>1/22 Ceftaz 1/23>>> azithro 1/23>>>  Interval hx  She continues to improve clinically but her oxygen requirements are high.  She has been on facemask - 70% and not cycling on/off  NIV-Mask.    PHYSICAL EXAMINATION:   BP 124/64  Pulse 95  Temp 97.5 F (36.4 C) (Oral)  Resp 32  Ht 5\' 1"  (1.549 m)  Wt 101 kg (222 lb 10.6 oz)  BMI 42.07 kg/m2  SpO2 93%  Intake/Output Summary (Last 24 hours) at 02/29/12 1009 Last data filed at 02/29/12 1000  Gross per 24 hour  Intake 2354.5 ml  Output   2050 ml  Net  304.5 ml  70% FM (one flap off NRBM)  HEENT:  Pupils are equal, round, and reactive to light.  Extraocular muscles are intact.  Oropharynx is clear. CHEST:  Crackles, diminished in bases.  CARDIAC: regular rhythm, no murmurs, rubs, or gallops. ABDOMEN:  Soft, nontender with hypoactive bowel sounds. EXTREMITIES:  Lower extremities with mild  edema and weak peripheral pulses, no cyanosis. NEUROLOGIC:  She is alert and oriented x4, and moves all four extremities.  No focal deficits.   Lab 02/29/12 0615 02/28/12 0345 02/27/12 0405  NA 135 134* 131*  K 4.0 3.3* 3.5  CL 91* 88* 87*  CO2 37* 37* 35*  BUN 40* 31* 18  CREATININE 1.18* 1.18* 1.06  GLUCOSE 153* 139* 145*    Lab 02/29/12 0615 02/28/12 0345 02/27/12 0405  HGB 9.1* 9.0* 8.9*  HCT 27.6* 27.3* 26.8*  WBC 10.7* 10.7* 8.1  PLT 245 223 164   Lab Results  Component Value Date   INR 1.47 02/29/2012   INR >10.00* 02/28/2012   INR >10.00* 02/28/2012   PROTIME 21.6* 02/07/2012   PROTIME 24.0* 01/11/2012   PROTIME 25.2* 12/14/2011   CBG (last 3)  No results found for this basename: GLUCAP:3 in the last 72 hours  Lab 02/29/12 0615 02/28/12 0345 02/27/12 0405 02/26/12 1420 02/26/12 0459  PROCALCITON -- 0.18 0.18 0.18 --  WBC 10.7* 10.7* 8.1 -- 7.7  LATICACIDVEN -- -- -- -- --   Erythrocyte Sedimentation Rate     Component Value Date/Time   ESRSEDRATE 90* 02/23/2012 1749  ANA: neg RF: neg Influenza pcr: neg   PCXR: No sig changes. Has diffuse bilateral airspace disease.   IMPRESSION:   Pulmonary Progressive  acute respiratory failure in the setting of worsening pulmonary infiltrates. ? Chemo related pneumonitis-->although has not improved w/ steroids.  ? Volume overload: seems doubtful  ? Infectious vs auto-immune: autoimmune neg to date.  Has not improved significantly. Her infiltrates are unchanged and she continues to need high FIO2.   Plan: Cont antibiotics for possible infection (see below) Cont Diurese as tolerated Cont Steroids  FOB for washings.  Daily cxr Wean FiO2 to 50% if tolerated.  If patient does not tolerate weaning O2,  cycle NIV mask 4 hours on 2 hours off -  Keep SPO2>90% Mobilize  Cardiac Hypertension - Controlled on PO meds  Plan Cont PO meds  Infectious disease Possible RNA (NOS) WBC elevated - but infiltrates are still  unchanged. Need to consider fungal or PCP infection.  PCT negative  Plan Trend WBC F/u cultures  Will complete 4d azithro and 4 d of ceftaz (as abx started on 1/17) assuming no further orgs identified.   Renal Mild hyponatremia, Mild hypochloremia: both improved.  Urine Osmo decreased Urine Cr and Na WNL Plan F/u chem Strict I&O  Endocrine  Thyroid function test WNL BG controlled, will check CBG q 4, add insulin if elevated.  Plan F/u chem  GI NPO Plan H2O sips/Ice chips as tolerated when NIV mask is off If O2 demands decrease she will be started on clear liquid diet   hematology  Anemia: Likely chronic disease. No evidence of bleeding. H&H stable DVT (right  vein): was on coumadin for this Coumadin induced coagulopathy: INR increased to >10 on 1/23 got Vit K, on 1/24 1.47 Fallopian tube carcinoma  Plan Coumadin per pharmacy    Neuro Intact  Plan Supportive care   02/29/2012 10:09 AM

## 2012-02-29 NOTE — Progress Notes (Signed)
STAFF NOTE: I, Dr Lavinia Sharps have personally reviewed patient's available data, including medical history, events of note, physical examination and test results as part of my evaluation. I have discussed with resident/NP and other care providers such as pharmacist, RN and RRT.  In addition,  I personally evaluated patient and elicited key findings of acute respiratory failure with hypoxemia and acute lung injury. Though her work of breathing is stable she continues to be on 60% oxygen on a BiPAP. She has not made any further improvement after the first day in the ICU. In order to better define her acute lung injury a bronchoscopy with lavage is indicated. I consented her for the same.  Rest per NP/medical resident whose note is outlined above and that I agree with  The patient is critically ill with multiple organ systems failure and requires high complexity decision making for assessment and support, frequent evaluation and titration of therapies, application of advanced monitoring technologies and extensive interpretation of multiple databases.   Critical Care Time devoted to patient care services described in this note is 31 Minutes independent of nurse practitioner time.  Dr. Kalman Shan, M.D., [facility].C.P Pulmonary and Critical Care Medicine Staff Physician [facility] [place] Pulmonary and Critical Care Pager: [phone], If no answer or between  15:00h - 7:00h: call [phone]  02/29/2012 11:48 AM

## 2012-03-01 ENCOUNTER — Inpatient Hospital Stay (HOSPITAL_COMMUNITY): Payer: Medicare Other

## 2012-03-01 DIAGNOSIS — E871 Hypo-osmolality and hyponatremia: Secondary | ICD-10-CM

## 2012-03-01 LAB — COMPREHENSIVE METABOLIC PANEL
Albumin: 2.4 g/dL — ABNORMAL LOW (ref 3.5–5.2)
Alkaline Phosphatase: 123 U/L — ABNORMAL HIGH (ref 39–117)
BUN: 37 mg/dL — ABNORMAL HIGH (ref 6–23)
Chloride: 89 mEq/L — ABNORMAL LOW (ref 96–112)
Creatinine, Ser: 0.96 mg/dL (ref 0.50–1.10)
GFR calc Af Amer: 74 mL/min — ABNORMAL LOW (ref >90)
Glucose, Bld: 159 mg/dL — ABNORMAL HIGH (ref 70–99)
Total Bilirubin: 0.4 mg/dL (ref 0.3–1.2)
Total Protein: 5.9 g/dL — ABNORMAL LOW (ref 6.0–8.3)

## 2012-03-01 LAB — CBC
HCT: 28.1 % — ABNORMAL LOW (ref 36.0–46.0)
Hemoglobin: 9.1 g/dL — ABNORMAL LOW (ref 12.0–15.0)
MCHC: 32.4 g/dL (ref 30.0–36.0)
MCV: 90.4 fL (ref 78.0–100.0)
RDW: 16.7 % — ABNORMAL HIGH (ref 11.5–15.5)

## 2012-03-01 LAB — PRO B NATRIURETIC PEPTIDE: Pro B Natriuretic peptide (BNP): 51.8 pg/mL (ref 0–125)

## 2012-03-01 MED ORDER — WARFARIN SODIUM 2.5 MG PO TABS
2.5000 mg | ORAL_TABLET | Freq: Once | ORAL | Status: AC
  Administered 2012-03-01: 2.5 mg via ORAL
  Filled 2012-03-01: qty 1

## 2012-03-01 NOTE — Progress Notes (Signed)
NAME:  JAMILLAH, CAMILO NO.:  [id number]    BRIEF The patient is a very pleasant 57 year old female who PCCM has been asked to see for an abnormal CT of chest as well as worsening dyspnea on exertion. 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  She most recently had a round of chemotherapy the beginning of this month of Jan 2014.    Of note, She is known to have non specific ILD findings 2009-> 2013 that were stable and  with calcified hilar nodes. In June 2013 when last seen by pccm in office she had dyspnea out of proportion to CT findings. Dyspnea felt due to be deconditoning, tachycardia (ultimate cardiac wokrup negative), obesity as prime factors. Summer 2013- PE was also ruled out. After that time in summer 2013 she did not fu with PCCM; a PFT had been scheduled  Cultures ESR 01/18/2014n >> high at 94 Autoimmune antibody 02/23/2012 and January 21> neg Influenza A 1/21 - NEG Influenza B 1/21 - NEG H1N1 1/21 - NEG Urine Culture - 1/19 - No Growth Blood Culturex2 1/18>>> Bronnch BAL RML 03/01/2012 by Dr. Marchelle Gearing >> 85% neutrophils. PCP and Gram stain negative  Antibiotics Zosyn 1/17>>>1/22 Vancomycin 1/20>>>1/22 Levofloxacin 1/21>>>1/22 Ceftaz 1/23>>> azithro 1/23>>>  Interval hx  03/01/2012 : She continues to improve clinically but her oxygen requirements are high.  She has been on facemask - 60% and not cycling on/off  NIV-Mask.    PHYSICAL EXAMINATION:   BP 116/90  Pulse 79  Temp 97.8 F (36.6 C) (Axillary)  Resp 26  Ht 5\' 1"  (1.549 m)  Wt 100.5 kg (221 lb 9 oz)  BMI 41.86 kg/m2  SpO2 95%  Intake/Output Summary (Last 24 hours) at 03/01/12 0953 Last data filed at 03/01/12 0700  Gross per 24 hour  Intake   1300 ml  Output   1150 ml  Net    150 ml    HEENT:  Pupils are equal, round, and reactive to light.  Extraocular muscles are intact.  Oropharynx is clear. CHEST:   Crackles, diminished in bases.  CARDIAC: regular rhythm, no murmurs, rubs, or gallops. ABDOMEN:  Soft, nontender with hypoactive bowel sounds. EXTREMITIES:  Lower extremities with mild edema and weak peripheral pulses, no cyanosis. NEUROLOGIC:  She is alert and oriented x4, and moves all four extremities.  No focal deficits.   Lab 03/01/12 0600 02/29/12 0615 02/28/12 0345  NA 135 135 134*  K 4.0 4.0 3.3*  CL 89* 91* 88*  CO2 35* 37* 37*  BUN 37* 40* 31*  CREATININE 0.96 1.18* 1.18*  GLUCOSE 159* 153* 139*    Lab 03/01/12 0600 02/29/12 0615 02/28/12 0345  HGB 9.1* 9.1* 9.0*  HCT 28.1* 27.6* 27.3*  WBC 10.0 10.7* 10.7*  PLT 266 245 223   Lab Results  Component Value Date   INR 1.29 03/01/2012   INR 1.47 02/29/2012   INR >10.00* 02/28/2012   PROTIME 21.6* 02/07/2012   PROTIME 24.0* 01/11/2012   PROTIME 25.2* 12/14/2011   CBG (last 3)  No results found for this basename: GLUCAP:3 in the last 72 hours  Lab 03/01/12 0600 02/29/12 0615 02/28/12 0345 02/27/12 0405 02/26/12 1420  PROCALCITON -- -- 0.18 0.18 0.18  WBC 10.0 10.7* 10.7* 8.1 --  LATICACIDVEN -- -- -- -- --   Erythrocyte Sedimentation Rate     Component  Value Date/Time   ESRSEDRATE 90* 02/23/2012 1749    PCXR: No sig changes. Has diffuse bilateral airspace disease.   IMPRESSION:   Pulmonary Progressive acute respiratory failure in the setting of worsening pulmonary infiltrates. ? Chemo related pneumonitis-->although has not improved w/ steroids.  ? Volume overload: seems doubtful  ? Infectious vs auto-immune: autoimmune neg to date.   On 03/01/2012: Pneumocystis carinii pneumonia ruled out. Autoimmune disease ruled out. We're left with acute lung injury of idiopathic variety versus chemotherapy related or hospital acquired infection that is yet to be identified on blood culture or BAL culture. Clinically better but continues to be hypoxemic requiring 60% oxygen  Plan: Cont antibiotics for possible infection (see  below) Cont Diurese as tolerated Cont Steroids  FOB remaining workup  Daily cxr Wean FiO2 to 50% if tolerated.  If patient does not tolerate weaning O2,  cycle NIV mask 4 hours on 2 hours off -  Keep SPO2>88% Mobilize  Cardiac Hypertension - Controlled on PO meds  Plan Cont PO meds  Infectious disease Possible RNA (NOS) WBC elevated - but infiltrates are still unchanged. Need to consider fungal or PCP infection.  PCT negative  Plan Trend WBC F/u cultures  Will complete 4d azithro and 4 d of ceftaz (as abx started on 1/17) assuming no further orgs identified.   Renal Mild hyponatremia, Mild hypochloremia: both improved.  Urine Osmo decreased Urine Cr and Na WNL Plan F/u chem Strict I&O  Endocrine  Thyroid function test WNL BG controlled, will check CBG q 4, add insulin if elevated.  Plan F/u chem  GI NPO Plan H2O sips/Ice chips as tolerated when NIV mask is off If O2 demands decrease she will be started on clear liquid diet   hematology  Anemia: Likely chronic disease. No evidence of bleeding. H&H stable DVT (right [place] vein): was on coumadin for this Coumadin induced coagulopathy: INR increased to >10 on 1/23 got Vit K, on 1/24 1.47 Fallopian tube carcinoma  Plan Coumadin per pharmacy    Neuro Intact  Plan Supportive care   03/01/2012 9:53 AM  9:59 AM    The patient is critically ill with multiple organ systems failure and requires high complexity decision making for assessment and support, frequent evaluation and titration of therapies, application of advanced monitoring technologies and extensive interpretation of multiple databases.   Critical Care Time devoted to patient care services described in this note is  31  Minutes.  Dr. Kalman Shan, M.D., [facility].C.P Pulmonary and Critical Care Medicine Staff Physician [facility] [place] Pager: [phone], If no answer or between  15:00h - 7:00h: call [phone]  03/01/2012 9:59 AM

## 2012-03-01 NOTE — Progress Notes (Signed)
[facility] PROGRESS NOTE  Abigail [name] QIO:[id number] DOB: December 11, 1955 DOA: 02/19/2012 PCP: Carollee Herter, MD  Brief narrative: Abigail [name] is a 57 year old woman with a past medical history of pulmonary fibrosis, breast cancer, and fallopian tube cancer, last chemotherapy 02/07/2012, was admitted to the hospital on 02/19/2012 with acute respiratory failure. A CT angiogram was done on 02/19/2012 which showed progression of pulmonary fibrosis. The pulmonary team has been consulting.  Assessment/Plan: Principal Problem:  *Acute respiratory failure with hypoxia / ILD / shortness of breath  Multifactorial. Known interstitial lung disease. Autoimmune workup negative to date. ANA negative. Rheumatoid factor 14.  CCP less than 2. Extractable nuclear antigen antibodies all negative. Followup ANCA.  ACE levels WNL.  Was transferred to the ICU for progressive respiratory failure necessitating BiPAP. Pulmonary on board. Patient had bronchoscopy on 02/29/12, had 30cc x 3 of saline BAL done with returns of greyish material which was sent out for analysis.  Patient is also being treated for diastolic congestive heart failure, possible acute respiratory distress syndrome, and healthcare associated pneumonia. 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 Patient had chemotherapy earlier this month and as such suspecting chemotherapy pneumonitis.  Is improving on current drug regimen - Disposition given that problem is mainly pulmonary will await recommendations regarding disposition from Pulmonologist.  Although this will mainly depend on continued improvement in condition and further work up results.  Active Problems:  Healthcare associated pneumonia  Procalcitonin not elevated, so sepsis felt to be unlikely. Blood cultures negative to date.  Influenza panel negative. Strep pneumonia antigen negative. Legionella antigen negative as well  Vanc stopped as no MRSA identified, Levaquin  discontinued due to rising INR.  Currently on Azithromycin and Ceftaz started 02/27/12.  Today is day # 11 of antibiotics.  1 more days of azithro and Ceftaz per pulmonary recs.  Patient afebrile and WBC only mildly elevated although patient is on Solumedrol 80 mg IV BID  Fallopian tube cancer, BRCA2 positive  Followup with oncology post discharge.  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 May also be a reflection of congestive heart failure.  Urine osmolality on last check was low at 276. Urine sodium 103. Serum osmolality 266. 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  Sodium trending up over time. Improved with diuresis.  Normocytic Anemia / vitamin B 12 deficiency / anemia of chronic disease  Vitamin B 12 level low at 206. We'll place on vitamin B12 supplementation.  Serum folate normal at 6.8. Low iron at less than 10. Urine iron binding capacity 212, ferritin high at 622  Sinus tachycardia  Patient has been worked up for this as outpatient with cardiologist and no specific cause has been found. 2-D echo done in July 2013 was showing EF of 60-65%. Continue increased dose of metoprolol and currently controlled with last reported HR at 79.  Thyroid function tests normal on 02/20/2012.   No events on telemetry, CTA negative.  DVT (deep venous thrombosis)  Continue Coumadin per pharmacy.  Hypomagnesemia / Hypokalemia  Monitor and replete as needed.  Replaced K today with oral regimen kdur 20 meq.   Acute on chronic diastolic congestive heart failure  Diuresed. ProBNP 167.5 on last check  Code Status: Full. Family Communication: None at bedside. Disposition Plan: Home when stable.   Medical Consultants:  Dr. Marcelyn Bruins, PCCM  Other Consultants:  Physical therapy: Home health PT versus SNF, [facility] aide.  Anti-infectives: Antibiotics:  Levaquin 1/14 >> 1/17  Vancomycin 1/17 >> 1/22 Zosyn 1/17 >> 1/21  Levaquin 02/26/2012>>1/22  HPI/Subjective: No acute  issues reported overnight. No new complaints. Bronchoscopy performed yesterday.  Objective: Filed Vitals:   03/01/12 0000 03/01/12 0254 03/01/12 0400 03/01/12 0500  BP: 133/92 132/94 116/90   Pulse: 85 97 79   Temp: 97 F (36.1 C)  97.8 F (36.6 C)   TempSrc: Oral  Axillary   Resp: 22 24 26    Height:      Weight:    100.5 kg (221 lb 9 oz)  SpO2: 95%  95%     Intake/Output Summary (Last 24 hours) at 03/01/12 0844 Last data filed at 03/01/12 0700  Gross per 24 hour  Intake   1560 ml  Output   1150 ml  Net    410 ml    Exam: Gen:  NAD, awake and alert Cardiovascular:  RRR, No M/R/G Respiratory:  Lungs with scattered rhonchi more at bases, Breath sounds BL Gastrointestinal:  Abdomen soft, NT/ND, + BS Extremities:  2+ edema  Data Reviewed: Basic Metabolic Panel:  Lab 03/01/12 4098 02/29/12 0615 02/28/12 0345 02/27/12 0405 02/26/12 0930 02/26/12 0459 02/25/12 0300 02/24/12 0325  NA 135 135 134* 131* -- 126* -- --  K 4.0 4.0 -- -- -- -- -- --  CL 89* 91* 88* 87* -- 88* -- --  CO2 35* 37* 37* 35* -- 31 -- --  GLUCOSE 159* 153* 139* 145* -- 124* -- --  BUN 37* 40* 31* 18 -- 10 -- --  CREATININE 0.96 1.18* 1.18* 1.06 -- 0.85 -- --  CALCIUM 9.3 9.3 9.2 9.3 -- 8.8 -- --  MG -- -- -- 1.6 1.3* -- 1.7 1.0*  PHOS -- -- -- -- -- -- -- --   GFR Estimated Creatinine Clearance: 69.5 ml/min (by C-G formula based on Cr of 0.96). Liver Function Tests:  Lab 03/01/12 0600 02/27/12 0405  AST 23 30  ALT 29 26  ALKPHOS 123* 150*  BILITOT 0.4 0.5  PROT 5.9* 6.7  ALBUMIN 2.4* 2.5*   Coagulation profile  Lab 03/01/12 0600 02/29/12 0615 02/28/12 1615 02/28/12 0345 02/27/12 0405  INR 1.29 1.47 >10.00* >10.00* 5.08*  PROTIME -- -- -- -- --    CBC:  Lab 03/01/12 0600 02/29/12 0615 02/28/12 0345 02/27/12 0405 02/26/12 0459  WBC 10.0 10.7* 10.7* 8.1 7.7  NEUTROABS -- -- -- 7.5 --  HGB 9.1* 9.1* 9.0* 8.9* 8.8*  HCT 28.1* 27.6* 27.3* 26.8* 26.8*  MCV 90.4 90.5 90.7 90.8 91.5  PLT  266 245 223 164 156   BNP (last 3 results)  Basename 03/01/12 0600 02/27/12 0405 02/26/12 0459  PROBNP 51.8 167.5* 258.3*   Anemia work up No results found for this basename: VITAMINB12:2,FOLATE:2,FERRITIN:2,TIBC:2,IRON:2,RETICCTPCT:2 in the last 72 hours Microbiology Recent Results (from the past 240 hour(s))  CULTURE, BLOOD (ROUTINE X 2)     Status: Normal   Collection Time   02/23/12  9:05 AM      Component Value Range Status Comment   Specimen Description BLOOD LEFT ARM   Final    Special Requests BOTTLES DRAWN AEROBIC AND ANAEROBIC 5CC   Final    Culture  Setup Time 02/23/2012 15:06   Final    Culture NO GROWTH 5 DAYS   Final    Report Status 02/29/2012 FINAL   Final   CULTURE, BLOOD (ROUTINE X 2)     Status: Normal   Collection Time   02/23/12  9:11 AM      Component Value Range Status Comment   Specimen Description  BLOOD RIGHT ARM   Final    Special Requests BOTTLES DRAWN AEROBIC AND ANAEROBIC 4CC   Final    Culture  Setup Time 02/23/2012 15:06   Final    Culture NO GROWTH 5 DAYS   Final    Report Status 02/29/2012 FINAL   Final   URINE CULTURE     Status: Normal   Collection Time   02/24/12  1:34 AM      Component Value Range Status Comment   Specimen Description URINE, CLEAN CATCH   Final    Special Requests NONE   Final    Culture  Setup Time 02/24/2012 12:36   Final    Colony Count NO GROWTH   Final    Culture NO GROWTH   Final    Report Status 02/25/2012 FINAL   Final   MRSA PCR SCREENING     Status: Normal   Collection Time   02/26/12  2:43 PM      Component Value Range Status Comment   MRSA by PCR NEGATIVE  NEGATIVE Final   PNEUMOCYSTIS JIROVECI SMEAR BY DFA     Status: Normal   Collection Time   02/29/12 11:21 AM      Component Value Range Status Comment   Specimen Source-PJSRC BRONCHIAL ALVEOLAR LAVAGE   Final    Pneumocystis jiroveci Ag NEGATIVE   Final      Procedures and Diagnostic Studies: Dg Chest 2 View  02/25/2012  *RADIOLOGY REPORT*  Clinical  Data: Severe shortness of breath.  CHEST - 2 VIEW  Comparison: CT and plain films of the chest 02/19/2012.  Findings: There has been worsening of airspace disease with markedly increased opacity seen in the right upper lobe.  Bibasilar airspace opacities also shows some progression.  There is cardiomegaly.  Chronic interstitial change again noted.  IMPRESSION: Worsened airspace disease, particularly in the right upper lobe, compatible with progressive pneumonia.   Original Report Authenticated By: Holley Dexter, M.D.    Dg Chest 2 View  02/19/2012  *RADIOLOGY REPORT*  Clinical Data: Shortness of breath, cough.  CHEST - 2 VIEW  Comparison: 09/27/2011  Findings: Right Port-A-Cath remains in place, unchanged. Cardiomegaly.  Increasing interstitial prominence throughout the lungs, likely interstitial edema.  Increasing bibasilar densities, likely atelectasis.  No effusions.  No acute bony abnormality.  IMPRESSION: Increasing interstitial prominence, likely edema and bibasilar atelectasis.   Original Report Authenticated By: Charlett Nose, M.D.    Ct Angio Chest W/cm &/or Wo Cm  02/19/2012  *RADIOLOGY REPORT*  Clinical Data: Tachycardia, shortness of breath.  History breast carcinoma.  The  CT ANGIOGRAPHY CHEST  Technique:  Multidetector CT imaging of the chest using the standard protocol during bolus administration of intravenous contrast. Multiplanar reconstructed images including MIPs were obtained and reviewed to evaluate the vascular anatomy.  Contrast: OMNIPAQUE IOHEXOL 350 MG/ML SOLN  Comparison: 09/27/2011  Findings: There is good contrast opacification of the pulmonary artery branches.  No discrete filling defect to suggest acute PE.Patient breathing during the acquisition degrades multiple images. Adequate contrast opacification of the thoracic aorta with no evidence of dissection, aneurysm, or stenosis. There is classic 3-vessel brachiocephalic arch anatomy.  Right port catheter extends to distal  SVC.  No pleural or pericardial effusion.  Stable sub centimeter anterior mediastinal and retrocrural lymph nodes.  No hilar adenopathy.  Probable central right subclavian vein short segment occlusion or stenosis with opacification of multiple body wall and mediastinal collateral venous channels.  Progressive fibrotic changes in both lung  bases with bronchiectasis.  There are coarse fibrotic changes in both upper lobes with pleural-based areas of coarse consolidation, also progressive since the prior study.  Decreased lung volumes. Patchy somewhat geographic ground- glass opacities throughout the remainder the lung fields. Exuberant flowing osteophytes across multiple contiguous levels in the mid and lower thoracic spine.  IMPRESSION:  1.  Negative for acute PE or thoracic aortic dissection. 2.  Progressive pulmonary parenchymal disease as above.   Original Report Authenticated By: D. Andria Rhein, MD    Dg Chest Port 1 View  02/26/2012  *RADIOLOGY REPORT*  Clinical Data: Worsening shortness of breath; difficulty breathing.  PORTABLE CHEST - 1 VIEW  Comparison: Chest radiograph performed 02/25/2012  Findings: Diffuse bilateral airspace opacification is noted, perhaps slightly worsened from the prior study.  This is concerning for mildly worsening multifocal pneumonia.  No pleural effusion or pneumothorax is seen.  The cardiomediastinal silhouette is mildly enlarged.  No acute osseous abnormalities are identified.  IMPRESSION:  1.  Diffuse bilateral airspace opacification, perhaps slightly worsened from the prior study.  This is concerning for severe mildly worsening multifocal pneumonia. 2.  Mild cardiomegaly.   Original Report Authenticated By: Tonia Ghent, M.D.     Scheduled Meds:    . amLODipine  5 mg Oral Daily  . antiseptic oral rinse  15 mL Mouth Rinse q12n4p  . azithromycin  500 mg Intravenous Q24H  . cefTAZidime (FORTAZ)  IV  1 g Intravenous Q8H  . chlorhexidine  15 mL Mouth Rinse BID  .  cholecalciferol  3,000 Units Oral Daily  . methylPREDNISolone (SOLU-MEDROL) injection  80 mg Intravenous BID  . metoprolol succinate  200 mg Oral Daily  . potassium chloride SA  20 mEq Oral Daily  . sodium chloride  3 mL Intravenous Q12H  . Warfarin - Pharmacist Dosing Inpatient   Does not apply q1800   Continuous Infusions:   Time spent: 35 minutes.   LOS: 11 days   Penny Pia  [facility] Pager [phone] If 8PM-8AM, please contact night-coverage at [email or web address], password [facility] 03/01/2012, 8:44 AM

## 2012-03-01 NOTE — Progress Notes (Signed)
ANTICOAGULATION CONSULT NOTE   Pharmacy Consult for Coumadin Indication: Right Subclavian DVT  Allergies  Allergen Reactions  . Lisinopril Cough  . Codeine Itching  . Oxycodone Nausea Only  . Vicodin (Hydrocodone-Acetaminophen) Nausea Only   Labs:  Basename 03/01/12 0600 02/29/12 0615 02/28/12 1615 02/28/12 0345  HGB 9.1* 9.1* -- --  HCT 28.1* 27.6* -- 27.3*  PLT 266 245 -- 223  APTT -- -- -- --  LABPROT 15.8* 17.4* 86.4* --  INR 1.29 1.47 >10.00* --  HEPARINUNFRC -- -- -- --  CREATININE 0.96 1.18* -- 1.18*  CKTOTAL -- -- -- --  CKMB -- -- -- --  TROPONINI -- -- -- --    Assessment:  57 yo F on chronic anticoagulation with warfarin for right subclavian DVT, admitted with respiratory failure of uncertain etiology.  Coumadin was continued on admission with pharmacy dosing and daily INR.  On 1/22 INR rose sharply, then continued to rise further despite withholding warfarin.  INR >10 on 1/23 - vitamin K 10mg  PO and 10mg  IV were administered and reversed INR to 1.47.  Warfarin cautiously resumed on 1/24 with 2.5 mg PO x 1  Hgb low but stable; no bleeding reported.   Goal of Therapy:  INR 2-3    Plan:   Repeat warfarin 2.5 mg PO x 1 today.  Anticipate a delay in full INR response for at least several days due to recent administration of vitamin K. Suggest consideration of adding Lovenox or unfractionated heparin if immediate anticoagulant coverage is indicated.  Elie Goody, PharmD, BCPS Pager: [phone] 03/01/2012  11:09 AM

## 2012-03-02 DIAGNOSIS — R791 Abnormal coagulation profile: Secondary | ICD-10-CM

## 2012-03-02 DIAGNOSIS — B348 Other viral infections of unspecified site: Secondary | ICD-10-CM | POA: Diagnosis present

## 2012-03-02 LAB — CULTURE, BAL-QUANTITATIVE W GRAM STAIN: Colony Count: 2000

## 2012-03-02 LAB — RESPIRATORY VIRUS PANEL
Influenza B: NOT DETECTED
Metapneumovirus: NOT DETECTED
Parainfluenza 1: NOT DETECTED
Parainfluenza 2: NOT DETECTED
Parainfluenza 3: NOT DETECTED
Rhinovirus: DETECTED — AB

## 2012-03-02 LAB — PROTIME-INR: Prothrombin Time: 18.6 seconds — ABNORMAL HIGH (ref 11.6–15.2)

## 2012-03-02 MED ORDER — WARFARIN SODIUM 2 MG PO TABS
2.0000 mg | ORAL_TABLET | Freq: Once | ORAL | Status: AC
  Administered 2012-03-02: 2 mg via ORAL
  Filled 2012-03-02: qty 1

## 2012-03-02 NOTE — Progress Notes (Signed)
[facility] PROGRESS NOTE  Adara Kittle RUE:[id number] DOB: August 22, 1955 DOA: 02/19/2012 PCP: Carollee Herter, MD  Brief narrative: Abigail [name] is a 57 year old woman with a past medical history of pulmonary fibrosis, breast cancer, and fallopian tube cancer, last chemotherapy 02/07/2012, was admitted to the hospital on 02/19/2012 with acute respiratory failure. A CT angiogram was done on 02/19/2012 which showed progression of pulmonary fibrosis. The pulmonary team has been consulting.  Assessment/Plan: Principal Problem:  *Acute respiratory failure with hypoxia / ILD / shortness of breath  Multifactorial. Known interstitial lung disease. Autoimmune workup negative to date. ANA negative. Rheumatoid factor 14.  CCP less than 2. Extractable nuclear antigen antibodies all negative. Followup ANCA.  ACE levels WNL.  Was transferred to the ICU for progressive respiratory failure necessitating BiPAP. Pulmonary on board. Patient had bronchoscopy on 02/29/12, had 30cc x 3 of saline BAL done with returns of greyish material which was sent out for analysis. - Patient had chemotherapy earlier this month and as such suspecting chemotherapy pneumonitis.  Is improving on current drug regimen - Disposition given that problem is mainly pulmonary will await recommendations regarding disposition from Pulmonologist.  Although this will mainly depend on continued improvement in condition and further work up results.  Active Problems:  Healthcare associated pneumonia  Procalcitonin not elevated, so sepsis felt to be unlikely. Blood cultures negative to date. BAL with no growth  Influenza panel negative. Strep pneumonia antigen negative. Legionella antigen negative as well Vanc stopped as no MRSA identified, Levaquin discontinued due to rising INR.  Currently on Azithromycin and Ceftaz started 02/27/12.  Today is day # 12 of antibiotics.   Patient afebrile and improving clinically.  Elevated IRN -  Has resolved and initially treated with Vitamin K patient had order to prepare FFP but order to transfuse was canceled and at this juncture I do not thing FFP was administered while in house. - INR went from > 10 to 1.4 and has been trending up with last value at 1.6. - Pt on coumadin for h/o DVT   Fallopian tube cancer, BRCA2 positive  Followup with oncology post discharge.   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 May also be a reflection of congestive heart failure.  Urine osmolality on last check was low at 276. Urine sodium 103. Serum osmolality 266. 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  Sodium trending up over time. Improved with diuresis.  Has resolved   Normocytic Anemia / vitamin B 12 deficiency / anemia of chronic disease  Vitamin B 12 level low at 206. We'll place on vitamin B12 supplementation.  Serum folate normal at 6.8. Low iron at less than 10. Urine iron binding capacity 212, ferritin high at 622   Sinus tachycardia  Patient has been worked up for this as outpatient with cardiologist and no specific cause has been found. 2-D echo done in July 2013 was showing EF of 60-65%. Continue increased dose of metoprolol and currently controlled with last reported HR at 81.  Thyroid function tests normal on 02/20/2012.   No events on telemetry, CTA negative.   DVT (deep venous thrombosis)  Continue Coumadin per pharmacy.   Hypomagnesemia / Hypokalemia  Monitor and replete as needed.  Replaced K today with oral regimen kdur 20 meq.    Acute on chronic diastolic congestive heart failure  Diuresed. ProBNP 167.5 on last check  Pt appears compensated at this time.  Code Status: Full. Family Communication: None at bedside. Disposition Plan: Home when stable.   Medical  Consultants:  Dr. Marcelyn Bruins, PCCM  Other Consultants:  Physical therapy: Home health PT versus SNF, [facility] aide.  Anti-infectives: Antibiotics:  Levaquin 1/14 >> 1/17    Vancomycin 1/17 >> 1/22 Zosyn 1/17 >> 1/21 Levaquin 02/26/2012>>1/22  HPI/Subjective: No acute issues reported overnight. No new complaints. Pt feels better  Objective: Filed Vitals:   03/02/12 0000 03/02/12 0020 03/02/12 0357 03/02/12 0400  BP: 141/94   138/90  Pulse: 82 92 86 81  Temp: 97.5 F (36.4 C)   97.7 F (36.5 C)  TempSrc: Axillary   Axillary  Resp: 26 27 23 27   Height:      Weight:      SpO2: 99% 100% 98% 97%    Intake/Output Summary (Last 24 hours) at 03/02/12 0809 Last data filed at 03/02/12 0400  Gross per 24 hour  Intake   1230 ml  Output    400 ml  Net    830 ml    Exam: Gen:  NAD, awake and alert Cardiovascular:  RRR, No M/R/G Respiratory:  Lungs with scattered rhonchi more at bases, Breath sounds BL Gastrointestinal:  Abdomen soft, NT/ND, + BS Extremities:  2+ edema  Data Reviewed: Basic Metabolic Panel:  Lab 03/01/12 0865 02/29/12 0615 02/28/12 0345 02/27/12 0405 02/26/12 0930 02/26/12 0459 02/25/12 0300  NA 135 135 134* 131* -- 126* --  K 4.0 4.0 -- -- -- -- --  CL 89* 91* 88* 87* -- 88* --  CO2 35* 37* 37* 35* -- 31 --  GLUCOSE 159* 153* 139* 145* -- 124* --  BUN 37* 40* 31* 18 -- 10 --  CREATININE 0.96 1.18* 1.18* 1.06 -- 0.85 --  CALCIUM 9.3 9.3 9.2 9.3 -- 8.8 --  MG -- -- -- 1.6 1.3* -- 1.7  PHOS -- -- -- -- -- -- --   GFR Estimated Creatinine Clearance: 69.5 ml/min (by C-G formula based on Cr of 0.96). Liver Function Tests:  Lab 03/01/12 0600 02/27/12 0405  AST 23 30  ALT 29 26  ALKPHOS 123* 150*  BILITOT 0.4 0.5  PROT 5.9* 6.7  ALBUMIN 2.4* 2.5*   Coagulation profile  Lab 03/02/12 0430 03/01/12 0600 02/29/12 0615 02/28/12 1615 02/28/12 0345  INR 1.61* 1.29 1.47 >10.00* >10.00*  PROTIME -- -- -- -- --    CBC:  Lab 03/01/12 0600 02/29/12 0615 02/28/12 0345 02/27/12 0405 02/26/12 0459  WBC 10.0 10.7* 10.7* 8.1 7.7  NEUTROABS -- -- -- 7.5 --  HGB 9.1* 9.1* 9.0* 8.9* 8.8*  HCT 28.1* 27.6* 27.3* 26.8* 26.8*  MCV 90.4  90.5 90.7 90.8 91.5  PLT 266 245 223 164 156   BNP (last 3 results)  Basename 03/01/12 0600 02/27/12 0405 02/26/12 0459  PROBNP 51.8 167.5* 258.3*   Anemia work up No results found for this basename: VITAMINB12:2,FOLATE:2,FERRITIN:2,TIBC:2,IRON:2,RETICCTPCT:2 in the last 72 hours Microbiology Recent Results (from the past 240 hour(s))  CULTURE, BLOOD (ROUTINE X 2)     Status: Normal   Collection Time   02/23/12  9:05 AM      Component Value Range Status Comment   Specimen Description BLOOD LEFT ARM   Final    Special Requests BOTTLES DRAWN AEROBIC AND ANAEROBIC 5CC   Final    Culture  Setup Time 02/23/2012 15:06   Final    Culture NO GROWTH 5 DAYS   Final    Report Status 02/29/2012 FINAL   Final   CULTURE, BLOOD (ROUTINE X 2)     Status: Normal   Collection  Time   02/23/12  9:11 AM      Component Value Range Status Comment   Specimen Description BLOOD RIGHT ARM   Final    Special Requests BOTTLES DRAWN AEROBIC AND ANAEROBIC 4CC   Final    Culture  Setup Time 02/23/2012 15:06   Final    Culture NO GROWTH 5 DAYS   Final    Report Status 02/29/2012 FINAL   Final   URINE CULTURE     Status: Normal   Collection Time   02/24/12  1:34 AM      Component Value Range Status Comment   Specimen Description URINE, CLEAN CATCH   Final    Special Requests NONE   Final    Culture  Setup Time 02/24/2012 12:36   Final    Colony Count NO GROWTH   Final    Culture NO GROWTH   Final    Report Status 02/25/2012 FINAL   Final   MRSA PCR SCREENING     Status: Normal   Collection Time   02/26/12  2:43 PM      Component Value Range Status Comment   MRSA by PCR NEGATIVE  NEGATIVE Final   CULTURE, BAL-QUANTITATIVE     Status: Normal (Preliminary result)   Collection Time   02/29/12 11:21 AM      Component Value Range Status Comment   Specimen Description BRONCHIAL ALVEOLAR LAVAGE   Final    Special Requests Immunocompromised   Final    Gram Stain     Final    Value: FEW WBC PRESENT,BOTH PMN AND  MONONUCLEAR     NO SQUAMOUS EPITHELIAL CELLS SEEN     NO ORGANISMS SEEN   Colony Count PENDING   Incomplete    Culture NO GROWTH 1 DAY   Final    Report Status PENDING   Incomplete   FUNGUS CULTURE W SMEAR     Status: Normal (Preliminary result)   Collection Time   02/29/12 11:21 AM      Component Value Range Status Comment   Specimen Description BRONCHIAL ALVEOLAR LAVAGE   Final    Special Requests Immunocompromised   Final    Fungal Smear NO YEAST OR FUNGAL ELEMENTS SEEN   Final    Culture CULTURE IN PROGRESS FOR FOUR WEEKS   Final    Report Status PENDING   Incomplete   RESPIRATORY VIRUS PANEL     Status: Abnormal   Collection Time   02/29/12 11:21 AM      Component Value Range Status Comment   Source - R[facility]AN BRONCHIAL ALVEOLAR LAVAGE   Corrected CORRECTED ON 01/26 AT 0451: PREVIOUSLY REPORTED AS BRONCHIAL ALVEOLAR LAVAGE   Respiratory Syncytial Virus A NOT DETECTED   Final    Respiratory Syncytial Virus B NOT DETECTED   Final    Influenza A NOT DETECTED   Final    Influenza B NOT DETECTED   Final    Parainfluenza 1 NOT DETECTED   Final    Parainfluenza 2 NOT DETECTED   Final    Parainfluenza 3 NOT DETECTED   Final    Metapneumovirus NOT DETECTED   Final    Rhinovirus DETECTED (*)  Final    Adenovirus NOT DETECTED   Final    Influenza A H1 NOT DETECTED   Final    Influenza A H3 NOT DETECTED   Final   AFB CULTURE WITH SMEAR     Status: Normal (Preliminary result)   Collection Time  02/29/12 11:21 AM      Component Value Range Status Comment   Specimen Description BRONCHIAL ALVEOLAR LAVAGE   Final    Special Requests Immunocompromised   Final    ACID FAST SMEAR NO ACID FAST BACILLI SEEN   Final    Culture     Final    Value: CULTURE WILL BE EXAMINED FOR 6 WEEKS BEFORE ISSUING A FINAL REPORT   Report Status PENDING   Incomplete   PNEUMOCYSTIS JIROVECI SMEAR BY DFA     Status: Normal   Collection Time   02/29/12 11:21 AM      Component Value Range Status Comment   Specimen  Source-[facility] BRONCHIAL ALVEOLAR LAVAGE   Final    Pneumocystis jiroveci Ag NEGATIVE   Final   LEGIONELLA CULTURE     Status: Normal (Preliminary result)   Collection Time   02/29/12 11:21 AM      Component Value Range Status Comment   Specimen Description BRONCHIAL ALVEOLAR LAVAGE   Final    Special Requests Immunocompromised   Final    Culture     Final    Value: NO LEGIONELLA ISOLATED, CULTURE IN PROGRESS FOR 5 DAYS   Report Status PENDING   Incomplete      Procedures and Diagnostic Studies: Dg Chest 2 View  02/25/2012  *RADIOLOGY REPORT*  Clinical Data: Severe shortness of breath.  CHEST - 2 VIEW  Comparison: CT and plain films of the chest 02/19/2012.  Findings: There has been worsening of airspace disease with markedly increased opacity seen in the right upper lobe.  Bibasilar airspace opacities also shows some progression.  There is cardiomegaly.  Chronic interstitial change again noted.  IMPRESSION: Worsened airspace disease, particularly in the right upper lobe, compatible with progressive pneumonia.   Original Report Authenticated By: Holley Dexter, M.D.    Dg Chest 2 View  02/19/2012  *RADIOLOGY REPORT*  Clinical Data: Shortness of breath, cough.  CHEST - 2 VIEW  Comparison: 09/27/2011  Findings: Right Port-A-Cath remains in place, unchanged. Cardiomegaly.  Increasing interstitial prominence throughout the lungs, likely interstitial edema.  Increasing bibasilar densities, likely atelectasis.  No effusions.  No acute bony abnormality.  IMPRESSION: Increasing interstitial prominence, likely edema and bibasilar atelectasis.   Original Report Authenticated By: Charlett Nose, M.D.    Ct Angio Chest W/cm &/or Wo Cm  02/19/2012  *RADIOLOGY REPORT*  Clinical Data: Tachycardia, shortness of breath.  History breast carcinoma.  The  CT ANGIOGRAPHY CHEST  Technique:  Multidetector CT imaging of the chest using the standard protocol during bolus administration of intravenous contrast. Multiplanar  reconstructed images including MIPs were obtained and reviewed to evaluate the vascular anatomy.  Contrast: OMNIPAQUE IOHEXOL 350 MG/ML SOLN  Comparison: 09/27/2011  Findings: There is good contrast opacification of the pulmonary artery branches.  No discrete filling defect to suggest acute PE.Patient breathing during the acquisition degrades multiple images. Adequate contrast opacification of the thoracic aorta with no evidence of dissection, aneurysm, or stenosis. There is classic 3-vessel brachiocephalic arch anatomy.  Right port catheter extends to distal SVC.  No pleural or pericardial effusion.  Stable sub centimeter anterior mediastinal and retrocrural lymph nodes.  No hilar adenopathy.  Probable central right subclavian vein short segment occlusion or stenosis with opacification of multiple body wall and mediastinal collateral venous channels.  Progressive fibrotic changes in both lung bases with bronchiectasis.  There are coarse fibrotic changes in both upper lobes with pleural-based areas of coarse consolidation, also progressive since the prior  study.  Decreased lung volumes. Patchy somewhat geographic ground- glass opacities throughout the remainder the lung fields. Exuberant flowing osteophytes across multiple contiguous levels in the mid and lower thoracic spine.  IMPRESSION:  1.  Negative for acute PE or thoracic aortic dissection. 2.  Progressive pulmonary parenchymal disease as above.   Original Report Authenticated By: D. Andria Rhein, MD    Dg Chest Port 1 View  02/26/2012  *RADIOLOGY REPORT*  Clinical Data: Worsening shortness of breath; difficulty breathing.  PORTABLE CHEST - 1 VIEW  Comparison: Chest radiograph performed 02/25/2012  Findings: Diffuse bilateral airspace opacification is noted, perhaps slightly worsened from the prior study.  This is concerning for mildly worsening multifocal pneumonia.  No pleural effusion or pneumothorax is seen.  The cardiomediastinal silhouette is  mildly enlarged.  No acute osseous abnormalities are identified.  IMPRESSION:  1.  Diffuse bilateral airspace opacification, perhaps slightly worsened from the prior study.  This is concerning for severe mildly worsening multifocal pneumonia. 2.  Mild cardiomegaly.   Original Report Authenticated By: Tonia Ghent, M.D.     Scheduled Meds:    . amLODipine  5 mg Oral Daily  . antiseptic oral rinse  15 mL Mouth Rinse q12n4p  . cefTAZidime (FORTAZ)  IV  1 g Intravenous Q8H  . chlorhexidine  15 mL Mouth Rinse BID  . cholecalciferol  3,000 Units Oral Daily  . methylPREDNISolone (SOLU-MEDROL) injection  80 mg Intravenous BID  . metoprolol succinate  200 mg Oral Daily  . potassium chloride SA  20 mEq Oral Daily  . sodium chloride  3 mL Intravenous Q12H  . Warfarin - Pharmacist Dosing Inpatient   Does not apply q1800   Continuous Infusions:   Time spent: 35 minutes.   LOS: 12 days   Penny Pia  [facility] Pager [phone] If 8PM-8AM, please contact night-coverage at [email or web address], password [facility] 03/02/2012, 8:09 AM

## 2012-03-02 NOTE — Progress Notes (Signed)
ANTICOAGULATION CONSULT NOTE   Pharmacy Consult for Coumadin Indication: Right Subclavian DVT  Allergies  Allergen Reactions  . Lisinopril Cough  . Codeine Itching  . Oxycodone Nausea Only  . Vicodin (Hydrocodone-Acetaminophen) Nausea Only   Labs:  Basename 03/02/12 0430 03/01/12 0600 02/29/12 0615  HGB -- 9.1* 9.1*  HCT -- 28.1* 27.6*  PLT -- 266 245  APTT -- -- --  LABPROT 18.6* 15.8* 17.4*  INR 1.61* 1.29 1.47  HEPARINUNFRC -- -- --  CREATININE -- 0.96 1.18*  CKTOTAL -- -- --  CKMB -- -- --  TROPONINI -- -- --   Warfarin doses administered 1/24 - 1/25: 2.5, 2.5mg   Assessment:  57 yo F on chronic anticoagulation with warfarin for right subclavian DVT, admitted with respiratory failure of uncertain etiology.  Coumadin was continued on admission with pharmacy dosing and daily INR.  On 1/22 INR rose sharply, then continued to rise further despite withholding warfarin.  INR >10 on 1/23 - vitamin K 10mg  PO and 10mg  IV were administered and reversed INR to 1.47.  Cautious re-initiation of warfarin in progress.  INR beginning to respond after 2.5 mg daily x 2 days  No bleeding reported.   Goal of Therapy:  INR 2-3    Plan:   Warfarin 2 mg PO x 1 today.  PT/INR daily  Elie Goody, PharmD, BCPS Pager: [phone] 03/02/2012  11:58 AM

## 2012-03-02 NOTE — Progress Notes (Signed)
NAME:  Abigail [name], Abigail [name] NO.:  [id number]    BRIEF The patient is a very pleasant 58 year old female who PCCM has been asked to see for an abnormal CT of chest as well as worsening dyspnea on exertion. 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  She most recently had a round of chemotherapy the beginning of this month of Jan 2014.    Of note, She is known to have non specific ILD findings 2009-> 2013 that were stable and  with calcified hilar nodes. In June 2013 when last seen by pccm in office she had dyspnea out of proportion to CT findings. Dyspnea felt due to be deconditoning, tachycardia (ultimate cardiac wokrup negative), obesity as prime factors. Summer 2013- PE was also ruled out. After that time in summer 2013 she did not fu with PCCM; a PFT had been scheduled  Cultures ESR 01/18/2014n >> high at 94 Autoimmune antibody 02/23/2012 and January 21> neg Influenza A 1/21 - NEG Influenza B 1/21 - NEG H1N1 1/21 - NEG Urine Culture - 1/19 - No Growth Blood Culturex2 1/18>>> Bronnch BAL RML 03/01/2012 by Dr. Marchelle Gearing >> 85% neutrophils.  RHINOVIRUS POSITIVE. PCP, AFB, Fungal , Gram stain negative  Antibiotics Zosyn 1/17>>>1/22 Vancomycin 1/20>>>1/22 Levofloxacin 1/21>>>1/22 Ceftaz 1/23>>> azithro 1/23>>>  EVENTS 03/01/2012 : She continues to improve clinically but her oxygen requirements are high.  She has been on facemask - 60% and not cycling on/off  NIV-Mask.     SUBJECTIVE/OVERNIGHT/INTERVAL HX - 03/02/12: No change. Still on high flow o2 and cycyling on and off bipap. RHINOVIRUS POSITIVE on PCr  PHYSICAL EXAMINATION:   BP 135/94  Pulse 83  Temp 97.9 F (36.6 C) (Axillary)  Resp 28  Ht 5\' 1"  (1.549 m)  Wt 100.5 kg (221 lb 9 oz)  BMI 41.86 kg/m2  SpO2 96%  Intake/Output Summary (Last 24 hours) at 03/02/12 1042 Last data filed at 03/02/12 1000  Gross per 24 hour  Intake   1670  ml  Output    400 ml  Net   1270 ml    HEENT:  Pupils are equal, round, and reactive to light.  Extraocular muscles are intact.  Oropharynx is clear. CHEST:  Crackles, diminished in bases.  CARDIAC: regular rhythm, no murmurs, rubs, or gallops. ABDOMEN:  Soft, nontender with hypoactive bowel sounds. EXTREMITIES:  Lower extremities with mild edema and weak peripheral pulses, no cyanosis. NEUROLOGIC:  She is alert and oriented x4, and moves all four extremities.  No focal deficits.   Lab 03/01/12 0600 02/29/12 0615 02/28/12 0345  NA 135 135 134*  K 4.0 4.0 3.3*  CL 89* 91* 88*  CO2 35* 37* 37*  BUN 37* 40* 31*  CREATININE 0.96 1.18* 1.18*  GLUCOSE 159* 153* 139*    Lab 03/01/12 0600 02/29/12 0615 02/28/12 0345  HGB 9.1* 9.1* 9.0*  HCT 28.1* 27.6* 27.3*  WBC 10.0 10.7* 10.7*  PLT 266 245 223   Lab Results  Component Value Date   INR 1.61* 03/02/2012   INR 1.29 03/01/2012   INR 1.47 02/29/2012   PROTIME 21.6* 02/07/2012   PROTIME 24.0* 01/11/2012   PROTIME 25.2* 12/14/2011   CBG (last 3)  No results found for this basename: GLUCAP:3 in the last 72 hours  Lab 03/01/12 0600 02/29/12 0615 02/28/12 0345 02/27/12 0405 02/26/12 1420  PROCALCITON -- -- 0.18  0.18 0.18  WBC 10.0 10.7* 10.7* 8.1 --  LATICACIDVEN -- -- -- -- --   Erythrocyte Sedimentation Rate     Component Value Date/Time   ESRSEDRATE 90* 02/23/2012 1749    PCXR: No sig changes. Has diffuse bilateral airspace disease.   IMPRESSION:   Pulmonary Progressive acute respiratory failure in the setting of worsening pulmonary infiltrates. ? Chemo related pneumonitis-->although has not improved w/ steroids.  ? Volume overload: seems doubtful  ? Infectious vs auto-immune: autoimmune neg to date.   On 03/02/2012:  Clinically better but continues to be hypoxemic requiring 60% oxygen. Rhinovirus positive. Clinical picture consistent with acute lung injury secondary to bad respiratory viral infection  Plan: Stop   antibiotics for possible infection (see below) Cont Diurese as tolerated Discontinue Steroids ; doubt there is a role respiratory virus-related acute lung injury  BiPAP each bedtime and facemask oxygen in the day Keep SPO2>88% Mobilize  Cardiac Hypertension - Controlled on PO meds  Plan Cont PO meds  Infectious disease Acute lung injury did do to respiratory virus rhinovirus infection . No evidence of superimposed bacterial infection   Plan F/u cultures  Stop all antibiotics  03/02/12   Renal Normal   Plan F/u chem Strict I&O  Endocrine  Thyroid function test WNL BG controlled, will check CBG q 4, add insulin if elevated.  Plan F/u chem  GI NPO clear liquid diet   hematology  Anemia: Likely chronic disease. No evidence of bleeding. H&H stable DVT (right  vein): was on coumadin for this Coumadin induced coagulopathy: INR increased to >10 on 1/23 got Vit K, on 1/24 1.47 Fallopian tube carcinoma  Plan Coumadin per pharmacy    Neuro Intact  Plan Supportive care   03/02/2012 10:42 AM  10:42 AM    The patient is critically ill with multiple organ systems failure and requires high complexity decision making for assessment and support, frequent evaluation and titration of therapies, application of advanced monitoring technologies and extensive interpretation of multiple databases.   Critical Care Time devoted to patient care services described in this note is  31  Minutes.  Dr. Kalman Shan, M.D., [facility].C.P Pulmonary and Critical Care Medicine Staff Physician [facility] [place] [facility] Pager: [phone], If no answer or between  15:00h - 7:00h: call [phone]  03/02/2012 10:42 AM

## 2012-03-03 LAB — PROTIME-INR: INR: 2.41 — ABNORMAL HIGH (ref 0.00–1.49)

## 2012-03-03 MED ORDER — FUROSEMIDE 10 MG/ML IJ SOLN
80.0000 mg | Freq: Two times a day (BID) | INTRAMUSCULAR | Status: AC
  Administered 2012-03-03 (×2): 80 mg via INTRAVENOUS
  Filled 2012-03-03 (×2): qty 8

## 2012-03-03 MED ORDER — POTASSIUM CHLORIDE 20 MEQ/15ML (10%) PO LIQD
40.0000 meq | Freq: Two times a day (BID) | ORAL | Status: AC
  Administered 2012-03-03 (×2): 40 meq via ORAL
  Filled 2012-03-03 (×2): qty 30

## 2012-03-03 MED ORDER — ALBUTEROL SULFATE (5 MG/ML) 0.5% IN NEBU
2.5000 mg | INHALATION_SOLUTION | Freq: Four times a day (QID) | RESPIRATORY_TRACT | Status: DC
  Administered 2012-03-03 (×2): 2.5 mg via RESPIRATORY_TRACT
  Filled 2012-03-03 (×4): qty 0.5

## 2012-03-03 NOTE — Progress Notes (Signed)
ANTICOAGULATION CONSULT NOTE   Pharmacy Consult for Coumadin Indication: Right Subclavian DVT  Allergies  Allergen Reactions  . Lisinopril Cough  . Codeine Itching  . Oxycodone Nausea Only  . Vicodin (Hydrocodone-Acetaminophen) Nausea Only   Labs:  Basename 03/03/12 0820 03/03/12 0625 03/02/12 0430 03/01/12 0600  HGB -- -- -- 9.1*  HCT -- -- -- 28.1*  PLT -- -- -- 266  APTT -- -- -- --  LABPROT 25.1* QUESTIONABLE RESULTS, RECOMMEND RECOLLECT TO VERIFY 18.6* --  INR 2.41* NOT CALCULATED 1.61* --  HEPARINUNFRC -- -- -- --  CREATININE -- -- -- 0.96  CKTOTAL -- -- -- --  CKMB -- -- -- --  TROPONINI -- -- -- --   Warfarin doses administered 1/24 - 1/25: 2.5, 2.5, 2mg   Assessment:  57 yo F on chronic anticoagulation with warfarin for right subclavian DVT, admitted with respiratory failure of uncertain etiology.  Coumadin was continued on admission with pharmacy dosing and daily INR.  On 1/22 INR rose sharply, then continued to rise further despite withholding warfarin.  INR >10 on 1/23 - vitamin K 10mg  PO and 10mg  IV were administered and reversed INR to 1.47.  Cautious re-initiation of warfarin in progress.  INR rose quickly overnight.   No bleeding reported.   CL diet.  Goal of Therapy:  INR 2-3   Plan:   No warfarin today.   PT/INR daily.  CBC in am.  Charolotte Eke, PharmD, pager [phone]. 03/03/2012,11:41 AM.

## 2012-03-03 NOTE — Progress Notes (Signed)
[facility] PROGRESS NOTE  Abigail [name] VWU:[id number] DOB: [birth year] DOA: 02/19/2012 PCP: Carollee Herter, MD  Brief narrative: Abigail [name] is a 57 year old woman with a past medical history of pulmonary fibrosis, breast cancer, and fallopian tube cancer, last chemotherapy 02/07/2012, was admitted to the hospital on 02/19/2012 with acute respiratory failure. A CT angiogram was done on 02/19/2012 which showed progression of pulmonary fibrosis. The pulmonary team has been consulting.  Assessment/Plan: Principal Problem:  *Acute respiratory failure with hypoxia / ILD / shortness of breath  Multifactorial. Known interstitial lung disease. Autoimmune workup negative to date. ANA negative. Rheumatoid factor 14.  CCP less than 2. Extractable nuclear antigen antibodies all negative. Followup ANCA.  ACE levels WNL.  Was transferred to the ICU for progressive respiratory failure necessitating BiPAP. Pulmonary on board. Patient had bronchoscopy on 02/29/12, had 30cc x 3 of saline BAL done with returns of greyish material which was sent out for analysis. - BAL positive for rhinovirus.  As such antibiotics and steroids discontinued.   - Will follow up with pulmonologist recommendations.  Active Problems:  Healthcare associated pneumonia - As mentioned above BAL positive for rhinovirus.  Agree with discontinuation of antibiotics given no evidence of bacterial superinfection.  Elevated IRN - Has resolved and initially treated with Vitamin K patient had order to prepare FFP but order to transfuse was canceled and at this juncture I do not thing FFP was administered while in house. - INR went from > 10 to 1.4 and has been trending up with last value at 2.41 - Pt on coumadin for h/o DVT   Fallopian tube cancer, BRCA2 positive  Followup with oncology post discharge.   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 May also be a reflection of congestive heart failure.  Urine osmolality  on last check was low at 276. Urine sodium 103. Serum osmolality 266. 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  Sodium trending up over time. Improved with diuresis.  Has resolved   Normocytic Anemia / vitamin B 12 deficiency / anemia of chronic disease  Vitamin B 12 level low at 206. We'll place on vitamin B12 supplementation.  Serum folate normal at 6.8. Low iron at less than 10. Urine iron binding capacity 212, ferritin high at 622   Sinus tachycardia  Patient has been worked up for this as outpatient with cardiologist and no specific cause has been found. 2-D echo done in July 2013 was showing EF of 60-65%. Continue increased dose of metoprolol and currently controlled with last reported HR at 81.  Thyroid function tests normal on 02/20/2012.   No events on telemetry  Has resolved and may have been due to initial infectious etiology   DVT (deep venous thrombosis)  Continue Coumadin per pharmacy.  - INR at target range 2.4   Hypomagnesemia / Hypokalemia  Monitor and replete as needed.  Resolved after replacement   Acute on chronic diastolic congestive heart failure  Diuresed. ProBNP 167.5 on last check  Pt appears compensated at this time.  Code Status: Full. Family Communication: None at bedside. Disposition Plan: Home when stable.   Medical Consultants:  Dr. Marcelyn Bruins, PCCM  Other Consultants:  Physical therapy: Home health PT versus SNF, [facility] aide.  Anti-infectives: Antibiotics:  Levaquin 1/14 >> 1/17  Vancomycin 1/17 >> 1/22 Zosyn 1/17 >> 1/21 Levaquin 02/26/2012>>1/22  HPI/Subjective: No acute issues reported overnight. No new complaints. Pt continues to feels better  Objective: Filed Vitals:   03/02/12 2130 03/03/12 0000 03/03/12 0400 03/03/12 0500  BP:  149/98 137/88   Pulse: 97 73 81 89  Temp:  97.7 F (36.5 C) 97.7 F (36.5 C)   TempSrc:  Axillary Axillary   Resp: 24 20 30 26   Height:      Weight:    103.4 kg (227 lb 15.3 oz)   SpO2: 100% 96% 93% 96%    Intake/Output Summary (Last 24 hours) at 03/03/12 0851 Last data filed at 03/03/12 0600  Gross per 24 hour  Intake   1560 ml  Output   1350 ml  Net    210 ml    Exam: Gen:  NAD, awake and alert Cardiovascular:  RRR, No M/R/G Respiratory:  On supplemental oxygen, no wheezes, breath sounds BL Gastrointestinal:  Abdomen soft, NT/ND, + BS Extremities:  2+ edema  Data Reviewed: Basic Metabolic Panel:  Lab 03/02/12 1610 03/01/12 0600 02/29/12 0615 02/28/12 0345 02/27/12 0405 02/26/12 0930 02/26/12 0459  NA -- 135 135 134* 131* -- 126*  K -- 4.0 4.0 -- -- -- --  CL -- 89* 91* 88* 87* -- 88*  CO2 -- 35* 37* 37* 35* -- 31  GLUCOSE -- 159* 153* 139* 145* -- 124*  BUN -- 37* 40* 31* 18 -- 10  CREATININE -- 0.96 1.18* 1.18* 1.06 -- 0.85  CALCIUM -- 9.3 9.3 9.2 9.3 -- 8.8  MG -- -- -- -- 1.6 1.3* --  PHOS 2.4 -- -- -- -- -- --   GFR Estimated Creatinine Clearance: 70.6 ml/min (by C-G formula based on Cr of 0.96). Liver Function Tests:  Lab 03/01/12 0600 02/27/12 0405  AST 23 30  ALT 29 26  ALKPHOS 123* 150*  BILITOT 0.4 0.5  PROT 5.9* 6.7  ALBUMIN 2.4* 2.5*   Coagulation profile  Lab 03/03/12 0820 03/03/12 0625 03/02/12 0430 03/01/12 0600 02/29/12 0615  INR 2.41* NOT CALCULATED 1.61* 1.29 1.47  PROTIME -- -- -- -- --    CBC:  Lab 03/01/12 0600 02/29/12 0615 02/28/12 0345 02/27/12 0405 02/26/12 0459  WBC 10.0 10.7* 10.7* 8.1 7.7  NEUTROABS -- -- -- 7.5 --  HGB 9.1* 9.1* 9.0* 8.9* 8.8*  HCT 28.1* 27.6* 27.3* 26.8* 26.8*  MCV 90.4 90.5 90.7 90.8 91.5  PLT 266 245 223 164 156   BNP (last 3 results)  Basename 03/01/12 0600 02/27/12 0405 02/26/12 0459  PROBNP 51.8 167.5* 258.3*   Anemia work up No results found for this basename: VITAMINB12:2,FOLATE:2,FERRITIN:2,TIBC:2,IRON:2,RETICCTPCT:2 in the last 72 hours Microbiology Recent Results (from the past 240 hour(s))  CULTURE, BLOOD (ROUTINE X 2)     Status: Normal   Collection Time    02/23/12  9:05 AM      Component Value Range Status Comment   Specimen Description BLOOD LEFT ARM   Final    Special Requests BOTTLES DRAWN AEROBIC AND ANAEROBIC 5CC   Final    Culture  Setup Time 02/23/2012 15:06   Final    Culture NO GROWTH 5 DAYS   Final    Report Status 02/29/2012 FINAL   Final   CULTURE, BLOOD (ROUTINE X 2)     Status: Normal   Collection Time   02/23/12  9:11 AM      Component Value Range Status Comment   Specimen Description BLOOD RIGHT ARM   Final    Special Requests BOTTLES DRAWN AEROBIC AND ANAEROBIC 4CC   Final    Culture  Setup Time 02/23/2012 15:06   Final    Culture NO GROWTH 5 DAYS   Final  Report Status 02/29/2012 FINAL   Final   URINE CULTURE     Status: Normal   Collection Time   02/24/12  1:34 AM      Component Value Range Status Comment   Specimen Description URINE, CLEAN CATCH   Final    Special Requests NONE   Final    Culture  Setup Time 02/24/2012 12:36   Final    Colony Count NO GROWTH   Final    Culture NO GROWTH   Final    Report Status 02/25/2012 FINAL   Final   MRSA PCR SCREENING     Status: Normal   Collection Time   02/26/12  2:43 PM      Component Value Range Status Comment   MRSA by PCR NEGATIVE  NEGATIVE Final   CULTURE, BAL-QUANTITATIVE     Status: Normal   Collection Time   02/29/12 11:21 AM      Component Value Range Status Comment   Specimen Description BRONCHIAL ALVEOLAR LAVAGE   Final    Special Requests Immunocompromised   Final    Gram Stain     Final    Value: FEW WBC PRESENT,BOTH PMN AND MONONUCLEAR     NO SQUAMOUS EPITHELIAL CELLS SEEN     NO ORGANISMS SEEN   Colony Count 2,000 COLONIES/ML   Final    Culture Non-Pathogenic Oropharyngeal-type Flora Isolated.   Final    Report Status 03/02/2012 FINAL   Final   FUNGUS CULTURE W SMEAR     Status: Normal (Preliminary result)   Collection Time   02/29/12 11:21 AM      Component Value Range Status Comment   Specimen Description BRONCHIAL ALVEOLAR LAVAGE   Final     Special Requests Immunocompromised   Final    Fungal Smear NO YEAST OR FUNGAL ELEMENTS SEEN   Final    Culture CULTURE IN PROGRESS FOR FOUR WEEKS   Final    Report Status PENDING   Incomplete   RESPIRATORY VIRUS PANEL     Status: Abnormal   Collection Time   02/29/12 11:21 AM      Component Value Range Status Comment   Source - [facility] BRONCHIAL ALVEOLAR LAVAGE   Corrected CORRECTED ON 01/26 AT 0451: PREVIOUSLY REPORTED AS BRONCHIAL ALVEOLAR LAVAGE   Respiratory Syncytial Virus A NOT DETECTED   Final    Respiratory Syncytial Virus B NOT DETECTED   Final    Influenza A NOT DETECTED   Final    Influenza B NOT DETECTED   Final    Parainfluenza 1 NOT DETECTED   Final    Parainfluenza 2 NOT DETECTED   Final    Parainfluenza 3 NOT DETECTED   Final    Metapneumovirus NOT DETECTED   Final    Rhinovirus DETECTED (*)  Final    Adenovirus NOT DETECTED   Final    Influenza A H1 NOT DETECTED   Final    Influenza A H3 NOT DETECTED   Final   AFB CULTURE WITH SMEAR     Status: Normal (Preliminary result)   Collection Time   02/29/12 11:21 AM      Component Value Range Status Comment   Specimen Description BRONCHIAL ALVEOLAR LAVAGE   Final    Special Requests Immunocompromised   Final    ACID FAST SMEAR NO ACID FAST BACILLI SEEN   Final    Culture     Final    Value: CULTURE WILL BE EXAMINED FOR 6 WEEKS BEFORE  ISSUING A FINAL REPORT   Report Status PENDING   Incomplete   PNEUMOCYSTIS JIROVECI SMEAR BY DFA     Status: Normal   Collection Time   02/29/12 11:21 AM      Component Value Range Status Comment   Specimen Source-[facility] BRONCHIAL ALVEOLAR LAVAGE   Final    Pneumocystis jiroveci Ag NEGATIVE   Final   LEGIONELLA CULTURE     Status: Normal (Preliminary result)   Collection Time   02/29/12 11:21 AM      Component Value Range Status Comment   Specimen Description BRONCHIAL ALVEOLAR LAVAGE   Final    Special Requests Immunocompromised   Final    Culture     Final    Value: NO LEGIONELLA  ISOLATED, CULTURE IN PROGRESS FOR 5 DAYS   Report Status PENDING   Incomplete      Procedures and Diagnostic Studies: Dg Chest 2 View  02/25/2012  *RADIOLOGY REPORT*  Clinical Data: Severe shortness of breath.  CHEST - 2 VIEW  Comparison: CT and plain films of the chest 02/19/2012.  Findings: There has been worsening of airspace disease with markedly increased opacity seen in the right upper lobe.  Bibasilar airspace opacities also shows some progression.  There is cardiomegaly.  Chronic interstitial change again noted.  IMPRESSION: Worsened airspace disease, particularly in the right upper lobe, compatible with progressive pneumonia.   Original Report Authenticated By: Holley Dexter, M.D.    Dg Chest 2 View  02/19/2012  *RADIOLOGY REPORT*  Clinical Data: Shortness of breath, cough.  CHEST - 2 VIEW  Comparison: 09/27/2011  Findings: Right Port-A-Cath remains in place, unchanged. Cardiomegaly.  Increasing interstitial prominence throughout the lungs, likely interstitial edema.  Increasing bibasilar densities, likely atelectasis.  No effusions.  No acute bony abnormality.  IMPRESSION: Increasing interstitial prominence, likely edema and bibasilar atelectasis.   Original Report Authenticated By: Charlett Nose, M.D.    Ct Angio Chest W/cm &/or Wo Cm  02/19/2012  *RADIOLOGY REPORT*  Clinical Data: Tachycardia, shortness of breath.  History breast carcinoma.  The  CT ANGIOGRAPHY CHEST  Technique:  Multidetector CT imaging of the chest using the standard protocol during bolus administration of intravenous contrast. Multiplanar reconstructed images including MIPs were obtained and reviewed to evaluate the vascular anatomy.  Contrast: OMNIPAQUE IOHEXOL 350 MG/ML SOLN  Comparison: 09/27/2011  Findings: There is good contrast opacification of the pulmonary artery branches.  No discrete filling defect to suggest acute PE.Patient breathing during the acquisition degrades multiple images. Adequate contrast  opacification of the thoracic aorta with no evidence of dissection, aneurysm, or stenosis. There is classic 3-vessel brachiocephalic arch anatomy.  Right port catheter extends to distal SVC.  No pleural or pericardial effusion.  Stable sub centimeter anterior mediastinal and retrocrural lymph nodes.  No hilar adenopathy.  Probable central right subclavian vein short segment occlusion or stenosis with opacification of multiple body wall and mediastinal collateral venous channels.  Progressive fibrotic changes in both lung bases with bronchiectasis.  There are coarse fibrotic changes in both upper lobes with pleural-based areas of coarse consolidation, also progressive since the prior study.  Decreased lung volumes. Patchy somewhat geographic ground- glass opacities throughout the remainder the lung fields. Exuberant flowing osteophytes across multiple contiguous levels in the mid and lower thoracic spine.  IMPRESSION:  1.  Negative for acute PE or thoracic aortic dissection. 2.  Progressive pulmonary parenchymal disease as above.   Original Report Authenticated By: D. Andria Rhein, MD    Dg Chest  Port 1 View  02/26/2012  *RADIOLOGY REPORT*  Clinical Data: Worsening shortness of breath; difficulty breathing.  PORTABLE CHEST - 1 VIEW  Comparison: Chest radiograph performed 02/25/2012  Findings: Diffuse bilateral airspace opacification is noted, perhaps slightly worsened from the prior study.  This is concerning for mildly worsening multifocal pneumonia.  No pleural effusion or pneumothorax is seen.  The cardiomediastinal silhouette is mildly enlarged.  No acute osseous abnormalities are identified.  IMPRESSION:  1.  Diffuse bilateral airspace opacification, perhaps slightly worsened from the prior study.  This is concerning for severe mildly worsening multifocal pneumonia. 2.  Mild cardiomegaly.   Original Report Authenticated By: Tonia Ghent, M.D.     Scheduled Meds:    . amLODipine  5 mg Oral Daily  .  antiseptic oral rinse  15 mL Mouth Rinse q12n4p  . chlorhexidine  15 mL Mouth Rinse BID  . cholecalciferol  3,000 Units Oral Daily  . metoprolol succinate  200 mg Oral Daily  . potassium chloride SA  20 mEq Oral Daily  . sodium chloride  3 mL Intravenous Q12H  . Warfarin - Pharmacist Dosing Inpatient   Does not apply q1800   Continuous Infusions:   Time spent: 35 minutes.   LOS: 13 days   Penny Pia  [facility] Pager [phone] If 8PM-8AM, please contact night-coverage at [email or web address], password [facility] 03/03/2012, 8:51 AM

## 2012-03-03 NOTE — Progress Notes (Signed)
NAME:  Abigail [name], Abigail [name] NO.:  [id number]    BRIEF The patient is a very pleasant 57 year old female who PCCM has been asked to see for an abnormal CT of chest as well as worsening dyspnea on exertion. 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  She most recently had a round of chemotherapy the beginning of this month of Jan 2014.    Of note, She is known to have non specific ILD findings 2009-> 2013 that were stable and  with calcified hilar nodes. In June 2013 when last seen by pccm in office she had dyspnea out of proportion to CT findings. Dyspnea felt due to be deconditoning, tachycardia (ultimate cardiac wokrup negative), obesity as prime factors. Summer 2013- PE was also ruled out. After that time in summer 2013 she did not fu with PCCM; a PFT had been scheduled  Cultures ESR 01/18/2014n >> high at 94 Autoimmune antibody 02/23/2012 and January 21> neg Influenza A 1/21 - NEG Influenza B 1/21 - NEG H1N1 1/21 - NEG Urine Culture - 1/19 - No Growth Blood Culturex2 1/18>>> Bronnch BAL RML 03/01/2012 by Dr. Marchelle Gearing >> 85% neutrophils.  RHINOVIRUS POSITIVE. PCP, AFB, Fungal , Gram stain negative  Antibiotics Zosyn 1/17>>>1/22 Vancomycin 1/20>>>1/22 Levofloxacin 1/21>>>1/22 Ceftaz 1/23>>> azithro 1/23>>>  EVENTS 03/01/2012 : She continues to improve clinically but her oxygen requirements are high.  She has been on facemask - 60% and not cycling on/off  NIV-Mask.     SUBJECTIVE/OVERNIGHT/INTERVAL HX - 03/02/12: No change. Still on high flow o2 and cycyling on and off bipap. RHINOVIRUS POSITIVE on PCr  PHYSICAL EXAMINATION:   BP 137/88  Pulse 89  Temp 97.7 F (36.5 C) (Axillary)  Resp 26  Ht 5\' 1"  (1.549 m)  Wt 103.4 kg (227 lb 15.3 oz)  BMI 43.07 kg/m2  SpO2 96%  Intake/Output Summary (Last 24 hours) at 03/03/12 0908 Last data filed at 03/03/12 0600  Gross per 24 hour  Intake    1550 ml  Output   1350 ml  Net    200 ml    HEENT:  Pupils are equal, round, and reactive to light.  Extraocular muscles are intact.  Oropharynx is clear. CHEST:  Crackles, diminished in bases.  CARDIAC: regular rhythm, no murmurs, rubs, or gallops. ABDOMEN:  Soft, nontender with hypoactive bowel sounds. EXTREMITIES:  Lower extremities with mild edema and weak peripheral pulses, no cyanosis. NEUROLOGIC:  She is alert and oriented x4, and moves all four extremities.  No focal deficits.   Lab 03/01/12 0600 02/29/12 0615 02/28/12 0345  NA 135 135 134*  K 4.0 4.0 3.3*  CL 89* 91* 88*  CO2 35* 37* 37*  BUN 37* 40* 31*  CREATININE 0.96 1.18* 1.18*  GLUCOSE 159* 153* 139*    Lab 03/01/12 0600 02/29/12 0615 02/28/12 0345  HGB 9.1* 9.1* 9.0*  HCT 28.1* 27.6* 27.3*  WBC 10.0 10.7* 10.7*  PLT 266 245 223   Lab Results  Component Value Date   INR 2.41* 03/03/2012   INR NOT CALCULATED 03/03/2012   INR 1.61* 03/02/2012   PROTIME 21.6* 02/07/2012   PROTIME 24.0* 01/11/2012   PROTIME 25.2* 12/14/2011   CBG (last 3)  No results found for this basename: GLUCAP:3 in the last 72 hours  Lab 03/01/12 0600 02/29/12 0615 02/28/12 0345 02/27/12 0405 02/26/12 1420  PROCALCITON -- --  0.18 0.18 0.18  WBC 10.0 10.7* 10.7* 8.1 --  LATICACIDVEN -- -- -- -- --   Erythrocyte Sedimentation Rate     Component Value Date/Time   ESRSEDRATE 90* 02/23/2012 1749    PCXR: No sig changes. Has diffuse bilateral airspace disease.   IMPRESSION:   Pulmonary Progressive acute respiratory failure in the setting of worsening pulmonary infiltrates/ ARDS s/p viral pneumonitis/PNA (rhinovirus) -off abx 1/26 also steroids stopped as not responsive to trial  Plan: Cont Diurese as tolerated  BiPAP each bedtime and facemask oxygen in the day Keep SPO2>88% Mobilize  Cardiac Hypertension - Controlled on PO meds  Plan Cont PO meds  Infectious disease Acute lung injury did do to respiratory virus rhinovirus  infection . No evidence of superimposed bacterial infection Plan F/u cultures    Renal Normal  Plan F/u chem Strict I&O  Endocrine  Thyroid function test WNL BG controlled, will check CBG q 4, add insulin if elevated.  Plan F/u chem  GI NPO clear liquid diet   hematology  Anemia: Likely chronic disease. No evidence of bleeding. H&H stable DVT (right [place] vein): was on coumadin for this Coumadin induced coagulopathy: INR increased to >10 on 1/23 got Vit K, on 1/24 1.47 Fallopian tube carcinoma  Plan Coumadin per pharmacy    Neuro Intact  Plan Supportive care   03/03/2012 9:08 AM  9:08 AM    The patient is critically ill with multiple organ systems failure and requires high complexity decision making for assessment and support, frequent evaluation and titration of therapies, application of advanced monitoring technologies and extensive interpretation of multiple databases.   Critical Care Time devoted to patient care services described in this note is  31  Minutes.  Dr. Kalman Shan, M.D., [facility].C.P Pulmonary and Critical Care Medicine Staff Physician [facility] [place] [facility] Pager: [phone], If no answer or between  15:00h - 7:00h: call [phone]  03/03/2012 9:08 AM                 NAME:  Abigail [name], Abigail [name] NO.:  [id number]    BRIEF The patient is a very pleasant 57 year old female who PCCM has been asked to see for an abnormal CT of chest as well as worsening dyspnea on exertion. 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  She most recently had a round of chemotherapy the beginning of this month of Jan 2014.    Of note, She is known to have non specific ILD findings 2009-> 2013 that were stable and  with calcified hilar nodes. In June 2013 when last seen by pccm in office she had dyspnea out of proportion to CT  findings. Dyspnea felt due to be deconditoning, tachycardia (ultimate cardiac wokrup negative), obesity as prime factors. Summer 2013- PE was also ruled out. After that time in summer 2013 she did not fu with PCCM; a PFT had been scheduled  Cultures ESR 01/18/2014n >> high at 94 Autoimmune antibody 02/23/2012 and January 21> neg Influenza A 1/21 - NEG Influenza B 1/21 - NEG H1N1 1/21 - NEG Urine Culture - 1/19 - No Growth Blood Culturex2 1/18>>> Bronnch BAL RML 03/01/2012 by Dr. Marchelle Gearing >> 85% neutrophils.  RHINOVIRUS POSITIVE. PCP, AFB, Fungal , Gram stain negative  Antibiotics Zosyn 1/17>>>1/22 Vancomycin 1/20>>>1/22 Levofloxacin 1/21>>>1/22 Ceftaz 1/23>>> azithro  1/23>>>  EVENTS 03/01/2012 : She continues to improve clinically but her oxygen requirements are high.  She has been on facemask - 60% and not cycling on/off  NIV-Mask.     SUBJECTIVE/OVERNIGHT/INTERVAL HX - 03/02/12: No change. Still on high flow o2 and cycyling on and off bipap. RHINOVIRUS POSITIVE on PCr  PHYSICAL EXAMINATION:   BP 137/88  Pulse 89  Temp 97.7 F (36.5 C) (Axillary)  Resp 26  Ht 5\' 1"  (1.549 m)  Wt 103.4 kg (227 lb 15.3 oz)  BMI 43.07 kg/m2  SpO2 96%  Intake/Output Summary (Last 24 hours) at 03/03/12 0908 Last data filed at 03/03/12 0600  Gross per 24 hour  Intake   1550 ml  Output   1350 ml  Net    200 ml    HEENT:  Pupils are equal, round, and reactive to light.  Extraocular muscles are intact.  Oropharynx is clear. CHEST:  Crackles, diminished in bases.  CARDIAC: regular rhythm, no murmurs, rubs, or gallops. ABDOMEN:  Soft, nontender with hypoactive bowel sounds. EXTREMITIES:  Lower extremities with mild edema and weak peripheral pulses, no cyanosis. NEUROLOGIC:  She is alert and oriented x4, and moves all four extremities.  No focal deficits.   Lab 03/01/12 0600 02/29/12 0615 02/28/12 0345  NA 135 135 134*  K 4.0 4.0 3.3*  CL 89* 91* 88*  CO2 35* 37* 37*  BUN 37* 40* 31*    CREATININE 0.96 1.18* 1.18*  GLUCOSE 159* 153* 139*    Lab 03/01/12 0600 02/29/12 0615 02/28/12 0345  HGB 9.1* 9.1* 9.0*  HCT 28.1* 27.6* 27.3*  WBC 10.0 10.7* 10.7*  PLT 266 245 223   Lab Results  Component Value Date   INR 2.41* 03/03/2012   INR NOT CALCULATED 03/03/2012   INR 1.61* 03/02/2012   PROTIME 21.6* 02/07/2012   PROTIME 24.0* 01/11/2012   PROTIME 25.2* 12/14/2011   CBG (last 3)  No results found for this basename: GLUCAP:3 in the last 72 hours  Lab 03/01/12 0600 02/29/12 0615 02/28/12 0345 02/27/12 0405 02/26/12 1420  PROCALCITON -- -- 0.18 0.18 0.18  WBC 10.0 10.7* 10.7* 8.1 --  LATICACIDVEN -- -- -- -- --   Erythrocyte Sedimentation Rate     Component Value Date/Time   ESRSEDRATE 90* 02/23/2012 1749    PCXR: No sig changes. Has diffuse bilateral airspace disease.   IMPRESSION:   Pulmonary Progressive acute respiratory failure in the setting of worsening pulmonary infiltrates. ? Chemo related pneumonitis-->although has not improved w/ steroids.  ? Volume overload: seems doubtful  ? Infectious vs auto-immune: autoimmune neg to date.   On 03/03/2012:  Clinically better but continues to be hypoxemic requiring 60% oxygen (she still rapidly desaturates) . Rhinovirus positive. Clinical picture consistent with acute lung injury secondary to bad respiratory viral infection Plan: Stop  antibiotics for possible infection (see below) Cont Diurese as tolerated Discontinue Steroids ; doubt there is a role respiratory virus-related acute lung injury  BiPAP each bedtime and facemask oxygen in the day Keep SPO2>88% Mobilize  Cardiac Hypertension - Controlled on PO meds  Plan Cont PO meds  Infectious disease Acute lung injury did do to respiratory virus rhinovirus infection . No evidence of superimposed bacterial infection  Plan F/u cultures  Stop all antibiotics  03/02/12  Renal Normal   Plan F/u chem Strict I&O  Endocrine  Thyroid function test WNL BG  controlled, will check CBG q 4, add insulin if elevated.  Plan F/u chem  GI NPO  clear liquid diet  hematology  Anemia: Likely chronic disease. No evidence of bleeding. H&H stable DVT (right [place] vein): was on coumadin for this Coumadin induced coagulopathy: INR increased to >10 on 1/23 got Vit K, on 1/24 1.47 Fallopian tube carcinoma  Plan Coumadin per pharmacy   Neuro Intact  Plan Supportive care  CC time 35 min.  Patient seen and examined, agree with above note.  I dictated the care and orders written for this patient under my direction.  Alyson Reedy, MD [phone]

## 2012-03-03 NOTE — Progress Notes (Signed)
CHART REVIEWED AND UPDATED.  Next chart review due on [id number]. NO DISCHARGE NEEDS PRESENT AT THIS TIME.  ltac reviewer to see for poss. Placement. CASE MANAGEMENT [phone]

## 2012-03-04 ENCOUNTER — Inpatient Hospital Stay (HOSPITAL_COMMUNITY): Payer: Medicare Other

## 2012-03-04 LAB — CBC
HCT: 29.7 % — ABNORMAL LOW (ref 36.0–46.0)
Hemoglobin: 9.8 g/dL — ABNORMAL LOW (ref 12.0–15.0)
RBC: 3.26 MIL/uL — ABNORMAL LOW (ref 3.87–5.11)
WBC: 12.7 10*3/uL — ABNORMAL HIGH (ref 4.0–10.5)

## 2012-03-04 LAB — PROTIME-INR: Prothrombin Time: 20.5 seconds — ABNORMAL HIGH (ref 11.6–15.2)

## 2012-03-04 LAB — BASIC METABOLIC PANEL
BUN: 27 mg/dL — ABNORMAL HIGH (ref 6–23)
Chloride: 92 mEq/L — ABNORMAL LOW (ref 96–112)
GFR calc Af Amer: 90 mL/min — ABNORMAL LOW (ref >90)
GFR calc non Af Amer: 77 mL/min — ABNORMAL LOW (ref >90)
Potassium: 4.2 mEq/L (ref 3.5–5.1)
Sodium: 135 mEq/L (ref 135–145)

## 2012-03-04 MED ORDER — WARFARIN SODIUM 2.5 MG PO TABS
2.5000 mg | ORAL_TABLET | Freq: Once | ORAL | Status: AC
  Administered 2012-03-04: 2.5 mg via ORAL
  Filled 2012-03-04: qty 1

## 2012-03-04 MED ORDER — FUROSEMIDE 10 MG/ML IJ SOLN
40.0000 mg | Freq: Two times a day (BID) | INTRAMUSCULAR | Status: AC
  Administered 2012-03-04 (×2): 40 mg via INTRAVENOUS
  Filled 2012-03-04 (×2): qty 4

## 2012-03-04 MED ORDER — POTASSIUM CHLORIDE 20 MEQ/15ML (10%) PO LIQD
30.0000 meq | Freq: Two times a day (BID) | ORAL | Status: DC
  Administered 2012-03-04 – 2012-03-05 (×3): 30 meq via ORAL
  Filled 2012-03-04 (×4): qty 22.5

## 2012-03-04 NOTE — Progress Notes (Signed)
[facility] PROGRESS NOTE  Brittyn Salaz ZOX:[id number] DOB: [birth year] DOA: 02/19/2012 PCP: Carollee Herter, MD  Brief narrative: Abigail [name] is a 57 year old woman with a past medical history of pulmonary fibrosis, breast cancer, and fallopian tube cancer, last chemotherapy 02/07/2012, was admitted to the hospital on 02/19/2012 with acute respiratory failure. A CT angiogram was done on 02/19/2012 which showed progression of pulmonary fibrosis. The pulmonary team has been consulting.  Bronchoscopy was performed and patient had BAL which came back positive for rhinovirus.  Given her underlying pulmonary disease she is expected to have a slow recovery.  As such case discussed with social worker for placement into [facility] and currently waiting bed placement.  [facility] has been preferred because Pulmonologist can round on patients there.  Assessment/Plan: Principal Problem:  *Acute respiratory failure with hypoxia / ILD / shortness of breath  Multifactorial. Known interstitial lung disease. Autoimmune workup negative to date. ANA negative. Rheumatoid factor 14.  CCP less than 2. Extractable nuclear antigen antibodies all negative. Followup ANCA.  ACE levels WNL.  Was transferred to the ICU for progressive respiratory failure necessitating BiPAP. Pulmonary on board. Patient had bronchoscopy on 02/29/12, had 30cc x 3 of saline BAL done with returns of greyish material which was sent out for analysis. - BAL positive for rhinovirus.  As such antibiotics and steroids discontinued.   - Will follow up with pulmonologist recommendations and continue those recommendations once patient ready to transition out of hospital.  Active Problems:  Healthcare associated pneumonia - As mentioned above BAL positive for rhinovirus.  Agree with discontinuation of antibiotics given no evidence of bacterial superinfection.  Elevated IRN - Has resolved and initially treated with Vitamin K patient had order to  prepare FFP but order to transfuse was canceled. - INR currently at 1.83 - Pt on coumadin for h/o DVT - Pharmacy dosing.   Fallopian tube cancer, BRCA2 positive  Followup with oncology post discharge.   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 May also be a reflection of congestive heart failure.  Urine osmolality on last check was low at 276. Urine sodium 103. Serum osmolality 266. 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  Sodium trending up over time. Improved with diuresis.  Has resolved   Normocytic Anemia / vitamin B 12 deficiency / anemia of chronic disease  Vitamin B 12 level low at 206. We'll place on vitamin B12 supplementation.  Serum folate normal at 6.8. Low iron at less than 10. Urine iron binding capacity 212, ferritin high at 622   Sinus tachycardia  Patient has been worked up for this as outpatient with cardiologist and no specific cause has been found. 2-D echo done in July 2013 was showing EF of 60-65%. Continue increased dose of metoprolol and currently controlled with last reported HR at 81.  Thyroid function tests normal on 02/20/2012.   No events on telemetry   DVT (deep venous thrombosis)  Continue Coumadin per pharmacy.  - Target INR 2-3   Hypomagnesemia / Hypokalemia  Monitor and replete as needed.  Resolved after replacement   Acute on chronic diastolic congestive heart failure  Diuresed. ProBNP 167.5 on last check  Pt appears compensated at this time.  Code Status: Full. Family Communication: None at bedside. Disposition Plan: Plan will be to transition to [facility] once beds available.   Medical Consultants:  Dr. Marcelyn Bruins, PCCM  Other Consultants:  Physical therapy: Home health PT versus SNF, [facility] aide.  Anti-infectives: Antibiotics:  Levaquin 1/14 >> 1/17  Vancomycin  1/17 >> 1/22 Zosyn 1/17 >> 1/21 Levaquin 02/26/2012>>1/22  HPI/Subjective: No acute issues reported overnight. No new complaints. Pt continues  to feels better  Objective: Filed Vitals:   03/04/12 0500 03/04/12 0800 03/04/12 1000 03/04/12 1200  BP:  113/89 136/86 97/63  Pulse:  94 115 106  Temp:  97.5 F (36.4 C)    TempSrc:  Axillary    Resp:  27 28 31   Height:      Weight: 100.2 kg (220 lb 14.4 oz)     SpO2:  95% 94% 99%    Intake/Output Summary (Last 24 hours) at 03/04/12 1417 Last data filed at 03/04/12 1200  Gross per 24 hour  Intake    644 ml  Output   5325 ml  Net  -4681 ml    Exam: Gen:  NAD, awake and alert Cardiovascular:  RRR, No M/R/G Respiratory:  On supplemental oxygen, breath sounds BL, no wheezes Gastrointestinal:  Abdomen soft, NT/ND, + BS Extremities:  2+ edema  Data Reviewed: Basic Metabolic Panel:  Lab 03/04/12 1478 03/02/12 1154 03/01/12 0600 02/29/12 0615 02/28/12 0345 02/27/12 0405  NA 135 -- 135 135 134* 131*  K 4.2 -- 4.0 -- -- --  CL 92* -- 89* 91* 88* 87*  CO2 37* -- 35* 37* 37* 35*  GLUCOSE 125* -- 159* 153* 139* 145*  BUN 27* -- 37* 40* 31* 18  CREATININE 0.82 -- 0.96 1.18* 1.18* 1.06  CALCIUM 9.3 -- 9.3 9.3 9.2 9.3  MG -- -- -- -- -- 1.6  PHOS -- 2.4 -- -- -- --   GFR Estimated Creatinine Clearance: 81.2 ml/min (by C-G formula based on Cr of 0.82). Liver Function Tests:  Lab 03/01/12 0600 02/27/12 0405  AST 23 30  ALT 29 26  ALKPHOS 123* 150*  BILITOT 0.4 0.5  PROT 5.9* 6.7  ALBUMIN 2.4* 2.5*   Coagulation profile  Lab 03/04/12 0350 03/03/12 0820 03/03/12 0625 03/02/12 0430 03/01/12 0600  INR 1.83* 2.41* NOT CALCULATED 1.61* 1.29  PROTIME -- -- -- -- --    CBC:  Lab 03/04/12 0350 03/01/12 0600 02/29/12 0615 02/28/12 0345 02/27/12 0405  WBC 12.7* 10.0 10.7* 10.7* 8.1  NEUTROABS -- -- -- -- 7.5  HGB 9.8* 9.1* 9.1* 9.0* 8.9*  HCT 29.7* 28.1* 27.6* 27.3* 26.8*  MCV 91.1 90.4 90.5 90.7 90.8  PLT 194 266 245 223 164   BNP (last 3 results)  Basename 03/01/12 0600 02/27/12 0405 02/26/12 0459  PROBNP 51.8 167.5* 258.3*   Anemia work up No results found for  this basename: VITAMINB12:2,FOLATE:2,FERRITIN:2,TIBC:2,IRON:2,RETICCTPCT:2 in the last 72 hours Microbiology Recent Results (from the past 240 hour(s))  URINE CULTURE     Status: Normal   Collection Time   02/24/12  1:34 AM      Component Value Range Status Comment   Specimen Description URINE, CLEAN CATCH   Final    Special Requests NONE   Final    Culture  Setup Time 02/24/2012 12:36   Final    Colony Count NO GROWTH   Final    Culture NO GROWTH   Final    Report Status 02/25/2012 FINAL   Final   MRSA PCR SCREENING     Status: Normal   Collection Time   02/26/12  2:43 PM      Component Value Range Status Comment   MRSA by PCR NEGATIVE  NEGATIVE Final   CULTURE, BAL-QUANTITATIVE     Status: Normal   Collection Time   02/29/12  11:21 AM      Component Value Range Status Comment   Specimen Description BRONCHIAL ALVEOLAR LAVAGE   Final    Special Requests Immunocompromised   Final    Gram Stain     Final    Value: FEW WBC PRESENT,BOTH PMN AND MONONUCLEAR     NO SQUAMOUS EPITHELIAL CELLS SEEN     NO ORGANISMS SEEN   Colony Count 2,000 COLONIES/ML   Final    Culture Non-Pathogenic Oropharyngeal-type Flora Isolated.   Final    Report Status 03/02/2012 FINAL   Final   FUNGUS CULTURE W SMEAR     Status: Normal (Preliminary result)   Collection Time   02/29/12 11:21 AM      Component Value Range Status Comment   Specimen Description BRONCHIAL ALVEOLAR LAVAGE   Final    Special Requests Immunocompromised   Final    Fungal Smear NO YEAST OR FUNGAL ELEMENTS SEEN   Final    Culture CULTURE IN PROGRESS FOR FOUR WEEKS   Final    Report Status PENDING   Incomplete   RESPIRATORY VIRUS PANEL     Status: Abnormal   Collection Time   02/29/12 11:21 AM      Component Value Range Status Comment   Source - [facility] BRONCHIAL ALVEOLAR LAVAGE   Corrected CORRECTED ON 01/26 AT 0451: PREVIOUSLY REPORTED AS BRONCHIAL ALVEOLAR LAVAGE   Respiratory Syncytial Virus A NOT DETECTED   Final    Respiratory  Syncytial Virus B NOT DETECTED   Final    Influenza A NOT DETECTED   Final    Influenza B NOT DETECTED   Final    Parainfluenza 1 NOT DETECTED   Final    Parainfluenza 2 NOT DETECTED   Final    Parainfluenza 3 NOT DETECTED   Final    Metapneumovirus NOT DETECTED   Final    Rhinovirus DETECTED (*)  Final    Adenovirus NOT DETECTED   Final    Influenza A H1 NOT DETECTED   Final    Influenza A H3 NOT DETECTED   Final   AFB CULTURE WITH SMEAR     Status: Normal (Preliminary result)   Collection Time   02/29/12 11:21 AM      Component Value Range Status Comment   Specimen Description BRONCHIAL ALVEOLAR LAVAGE   Final    Special Requests Immunocompromised   Final    ACID FAST SMEAR NO ACID FAST BACILLI SEEN   Final    Culture     Final    Value: CULTURE WILL BE EXAMINED FOR 6 WEEKS BEFORE ISSUING A FINAL REPORT   Report Status PENDING   Incomplete   PNEUMOCYSTIS JIROVECI SMEAR BY DFA     Status: Normal   Collection Time   02/29/12 11:21 AM      Component Value Range Status Comment   Specimen Source-[facility] BRONCHIAL ALVEOLAR LAVAGE   Final    Pneumocystis jiroveci Ag NEGATIVE   Final   LEGIONELLA CULTURE     Status: Normal (Preliminary result)   Collection Time   02/29/12 11:21 AM      Component Value Range Status Comment   Specimen Description BRONCHIAL ALVEOLAR LAVAGE   Final    Special Requests Immunocompromised   Final    Culture     Final    Value: NO LEGIONELLA ISOLATED, CULTURE IN PROGRESS FOR 5 DAYS   Report Status PENDING   Incomplete      Procedures and Diagnostic Studies: Dg  Chest 2 View  02/25/2012  *RADIOLOGY REPORT*  Clinical Data: Severe shortness of breath.  CHEST - 2 VIEW  Comparison: CT and plain films of the chest 02/19/2012.  Findings: There has been worsening of airspace disease with markedly increased opacity seen in the right upper lobe.  Bibasilar airspace opacities also shows some progression.  There is cardiomegaly.  Chronic interstitial change again noted.   IMPRESSION: Worsened airspace disease, particularly in the right upper lobe, compatible with progressive pneumonia.   Original Report Authenticated By: Holley Dexter, M.D.    Dg Chest 2 View  02/19/2012  *RADIOLOGY REPORT*  Clinical Data: Shortness of breath, cough.  CHEST - 2 VIEW  Comparison: 09/27/2011  Findings: Right Port-A-Cath remains in place, unchanged. Cardiomegaly.  Increasing interstitial prominence throughout the lungs, likely interstitial edema.  Increasing bibasilar densities, likely atelectasis.  No effusions.  No acute bony abnormality.  IMPRESSION: Increasing interstitial prominence, likely edema and bibasilar atelectasis.   Original Report Authenticated By: Charlett Nose, M.D.    Ct Angio Chest W/cm &/or Wo Cm  02/19/2012  *RADIOLOGY REPORT*  Clinical Data: Tachycardia, shortness of breath.  History breast carcinoma.  The  CT ANGIOGRAPHY CHEST  Technique:  Multidetector CT imaging of the chest using the standard protocol during bolus administration of intravenous contrast. Multiplanar reconstructed images including MIPs were obtained and reviewed to evaluate the vascular anatomy.  Contrast: OMNIPAQUE IOHEXOL 350 MG/ML SOLN  Comparison: 09/27/2011  Findings: There is good contrast opacification of the pulmonary artery branches.  No discrete filling defect to suggest acute PE.Patient breathing during the acquisition degrades multiple images. Adequate contrast opacification of the thoracic aorta with no evidence of dissection, aneurysm, or stenosis. There is classic 3-vessel brachiocephalic arch anatomy.  Right port catheter extends to distal SVC.  No pleural or pericardial effusion.  Stable sub centimeter anterior mediastinal and retrocrural lymph nodes.  No hilar adenopathy.  Probable central right subclavian vein short segment occlusion or stenosis with opacification of multiple body wall and mediastinal collateral venous channels.  Progressive fibrotic changes in both lung bases with  bronchiectasis.  There are coarse fibrotic changes in both upper lobes with pleural-based areas of coarse consolidation, also progressive since the prior study.  Decreased lung volumes. Patchy somewhat geographic ground- glass opacities throughout the remainder the lung fields. Exuberant flowing osteophytes across multiple contiguous levels in the mid and lower thoracic spine.  IMPRESSION:  1.  Negative for acute PE or thoracic aortic dissection. 2.  Progressive pulmonary parenchymal disease as above.   Original Report Authenticated By: D. Andria Rhein, MD    Dg Chest Port 1 View  02/26/2012  *RADIOLOGY REPORT*  Clinical Data: Worsening shortness of breath; difficulty breathing.  PORTABLE CHEST - 1 VIEW  Comparison: Chest radiograph performed 02/25/2012  Findings: Diffuse bilateral airspace opacification is noted, perhaps slightly worsened from the prior study.  This is concerning for mildly worsening multifocal pneumonia.  No pleural effusion or pneumothorax is seen.  The cardiomediastinal silhouette is mildly enlarged.  No acute osseous abnormalities are identified.  IMPRESSION:  1.  Diffuse bilateral airspace opacification, perhaps slightly worsened from the prior study.  This is concerning for severe mildly worsening multifocal pneumonia. 2.  Mild cardiomegaly.   Original Report Authenticated By: Tonia Ghent, M.D.     Scheduled Meds:    . amLODipine  5 mg Oral Daily  . antiseptic oral rinse  15 mL Mouth Rinse q12n4p  . chlorhexidine  15 mL Mouth Rinse BID  . cholecalciferol  3,000 Units Oral Daily  . furosemide  40 mg Intravenous Q12H  . metoprolol succinate  200 mg Oral Daily  . potassium chloride  30 mEq Oral Q12H  . sodium chloride  3 mL Intravenous Q12H  . Warfarin - Pharmacist Dosing Inpatient   Does not apply q1800   Continuous Infusions:   Time spent: 35 minutes.   LOS: 14 days   Penny Pia  [facility] Pager [phone] If 8PM-8AM, please contact night-coverage at  [email or web address], password [facility] 03/04/2012, 2:17 PM

## 2012-03-04 NOTE — Progress Notes (Signed)
NAME:  Abigail [name], Abigail [name] NO.:  [id number]    BRIEF The patient is a very pleasant 57 year old female who PCCM has been asked to see for an abnormal CT of chest as well as worsening dyspnea on exertion. 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  She most recently had a round of chemotherapy the beginning of this month of Jan 2014.    Of note, She is known to have non specific ILD findings 2009-> 2013 that were stable and  with calcified hilar nodes. In June 2013 when last seen by pccm in office she had dyspnea out of proportion to CT findings. Dyspnea felt due to be deconditoning, tachycardia (ultimate cardiac wokrup negative), obesity as prime factors. Summer 2013- PE was also ruled out. After that time in summer 2013 she did not fu with PCCM; a PFT had been scheduled  Cultures ESR 01/18/2014n >> high at 94 Autoimmune antibody 02/23/2012 and January 21> neg Influenza A 1/21 - NEG Influenza B 1/21 - NEG H1N1 1/21 - NEG Urine Culture - 1/19 - No Growth Blood Culturex2 1/18>>> Bronnch BAL RML 03/01/2012 by Dr. Marchelle Gearing >> 85% neutrophils.  RHINOVIRUS POSITIVE. PCP, AFB, Fungal , Gram stain negative  Antibiotics Zosyn 1/17>>>1/22 Vancomycin 1/20>>>1/22 Levofloxacin 1/21>>>1/22 Ceftaz 1/23>>> azithro 1/23>>>  EVENTS 03/01/2012 : She continues to improve clinically but her oxygen requirements are high.  She has been on facemask - 60% and not cycling on/off  NIV-Mask.     SUBJECTIVE/OVERNIGHT/INTERVAL HX - 03/02/12: No change. Still on high flow o2 and cycyling on and off bipap. RHINOVIRUS POSITIVE on PCr  PHYSICAL EXAMINATION:   BP 100/39  Pulse 93  Temp 97.5 F (36.4 C) (Axillary)  Resp 28  Ht 5\' 1"  (1.549 m)  Wt 100.2 kg (220 lb 14.4 oz)  BMI 41.74 kg/m2  SpO2 95%  Intake/Output Summary (Last 24 hours) at 03/04/12 0908 Last data filed at 03/04/12 0600  Gross per 24 hour  Intake     420 ml  Output   4825 ml  Net  -4405 ml    HEENT:  Pupils are equal, round, and reactive to light.  Extraocular muscles are intact.  Oropharynx is clear. CHEST:  Crackles, diminished in bases.  CARDIAC: regular rhythm, no murmurs, rubs, or gallops. ABDOMEN:  Soft, nontender with hypoactive bowel sounds. EXTREMITIES:  Lower extremities with mild edema and weak peripheral pulses, no cyanosis. NEUROLOGIC:  She is alert and oriented x4, and moves all four extremities.  No focal deficits.   Lab 03/04/12 0350 03/01/12 0600 02/29/12 0615  NA 135 135 135  K 4.2 4.0 4.0  CL 92* 89* 91*  CO2 37* 35* 37*  BUN 27* 37* 40*  CREATININE 0.82 0.96 1.18*  GLUCOSE 125* 159* 153*    Lab 03/04/12 0350 03/01/12 0600 02/29/12 0615  HGB 9.8* 9.1* 9.1*  HCT 29.7* 28.1* 27.6*  WBC 12.7* 10.0 10.7*  PLT 194 266 245   Lab Results  Component Value Date   INR 1.83* 03/04/2012   INR 2.41* 03/03/2012   INR NOT CALCULATED 03/03/2012   PROTIME 21.6* 02/07/2012   PROTIME 24.0* 01/11/2012   PROTIME 25.2* 12/14/2011   CBG (last 3)  No results found for this basename: GLUCAP:3 in the last 72 hours  Lab 03/04/12 0350 03/01/12 0600 02/29/12 0615 02/28/12 0345 02/27/12 0405 02/26/12 1420  PROCALCITON -- -- --  0.18 0.18 0.18  WBC 12.7* 10.0 10.7* 10.7* -- --  LATICACIDVEN -- -- -- -- -- --   Erythrocyte Sedimentation Rate     Component Value Date/Time   ESRSEDRATE 90* 02/23/2012 1749    PCXR: No sig changes. Has diffuse bilateral airspace disease.   IMPRESSION:   Pulmonary Progressive acute respiratory failure in the setting of worsening pulmonary infiltrates/ ARDS s/p viral pneumonitis/PNA (rhinovirus) -off abx 1/26 also steroids stopped as not responsive to trial  Plan: Cont Diurese as tolerated  BiPAP each bedtime and facemask oxygen in the day Keep SPO2>88% Mobilize  Cardiac Hypertension - Controlled on PO meds  Plan Cont PO meds  Infectious disease Acute lung injury did do to respiratory  virus rhinovirus infection . No evidence of superimposed bacterial infection Plan F/u cultures    Renal Normal  Plan F/u chem Strict I&O  Endocrine  Thyroid function test WNL BG controlled, will check CBG q 4, add insulin if elevated.  Plan F/u chem  GI NPO clear liquid diet   hematology  Anemia: Likely chronic disease. No evidence of bleeding. H&H stable DVT (right [place] vein): was on coumadin for this Coumadin induced coagulopathy: INR increased to >10 on 1/23 got Vit K, on 1/24 1.47 Fallopian tube carcinoma  Plan Coumadin per pharmacy    Neuro Intact  Plan Supportive care   03/04/2012 9:08 AM  9:08 AM    The patient is critically ill with multiple organ systems failure and requires high complexity decision making for assessment and support, frequent evaluation and titration of therapies, application of advanced monitoring technologies and extensive interpretation of multiple databases.   Critical Care Time devoted to patient care services described in this note is  31  Minutes.  Dr. Kalman Shan, M.D., [facility].C.P Pulmonary and Critical Care Medicine Staff Physician [facility] [place] Pager: [phone], If no answer or between  15:00h - 7:00h: call [phone]  03/04/2012 9:08 AM                 NAME:  Abigail [name] NO.:  [id number]    BRIEF The patient is a very pleasant 57 year old female who PCCM has been asked to see for an abnormal CT of chest as well as worsening dyspnea on exertion. 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  She most recently had a round of chemotherapy the beginning of this month of Jan 2014.    Of note, She is known to have non specific ILD findings 2009-> 2013 that were stable and  with calcified hilar nodes. In June 2013 when last seen by pccm in office she had dyspnea out of  proportion to CT findings. Dyspnea felt due to be deconditoning, tachycardia (ultimate cardiac wokrup negative), obesity as prime factors. Summer 2013- PE was also ruled out. After that time in summer 2013 she did not fu with PCCM; a PFT had been scheduled  Cultures ESR 01/18/2014n >> high at 94 Autoimmune antibody 02/23/2012 and January 21> neg Influenza A 1/21 - NEG Influenza B 1/21 - NEG H1N1 1/21 - NEG Urine Culture - 1/19 - No Growth Blood Culturex2 1/18>>> Bronnch BAL RML 03/01/2012 by Dr. Marchelle Gearing >> 85% neutrophils.  RHINOVIRUS POSITIVE. PCP, AFB, Fungal , Gram stain negative  Antibiotics Zosyn 1/17>>>1/22 Vancomycin 1/20>>>1/22 Levofloxacin 1/21>>>1/22 Ceftaz  1/23>>> azithro 1/23>>>  EVENTS 03/01/2012 : She continues to improve clinically but her oxygen requirements are high.  She has been on facemask - 60% and not cycling on/off  NIV-Mask.     SUBJECTIVE/OVERNIGHT/INTERVAL HX - 03/02/12: No change. Still on high flow o2 and cycyling on and off bipap. RHINOVIRUS POSITIVE on PCr  PHYSICAL EXAMINATION:   BP 100/39  Pulse 93  Temp 97.5 F (36.4 C) (Axillary)  Resp 28  Ht 5\' 1"  (1.549 m)  Wt 100.2 kg (220 lb 14.4 oz)  BMI 41.74 kg/m2  SpO2 95%  Intake/Output Summary (Last 24 hours) at 03/04/12 0908 Last data filed at 03/04/12 0600  Gross per 24 hour  Intake    420 ml  Output   4825 ml  Net  -4405 ml    HEENT:  Pupils are equal, round, and reactive to light.  Extraocular muscles are intact.  Oropharynx is clear. CHEST:  Crackles, diminished in bases. No accessory muscle use  CARDIAC: regular rhythm, no murmurs, rubs, or gallops. ABDOMEN:  Soft, nontender with hypoactive bowel sounds. EXTREMITIES:  Lower extremities with mild edema and weak peripheral pulses, no cyanosis. NEUROLOGIC:  She is alert and oriented x4, and moves all four extremities.  No focal deficits.   Lab 03/04/12 0350 03/01/12 0600 02/29/12 0615  NA 135 135 135  K 4.2 4.0 4.0  CL 92* 89* 91*    CO2 37* 35* 37*  BUN 27* 37* 40*  CREATININE 0.82 0.96 1.18*  GLUCOSE 125* 159* 153*    Lab 03/04/12 0350 03/01/12 0600 02/29/12 0615  HGB 9.8* 9.1* 9.1*  HCT 29.7* 28.1* 27.6*  WBC 12.7* 10.0 10.7*  PLT 194 266 245   Lab Results  Component Value Date   INR 1.83* 03/04/2012   INR 2.41* 03/03/2012   INR NOT CALCULATED 03/03/2012   PROTIME 21.6* 02/07/2012   PROTIME 24.0* 01/11/2012   PROTIME 25.2* 12/14/2011   CBG (last 3)  No results found for this basename: GLUCAP:3 in the last 72 hours  Lab 03/04/12 0350 03/01/12 0600 02/29/12 0615 02/28/12 0345 02/27/12 0405 02/26/12 1420  PROCALCITON -- -- -- 0.18 0.18 0.18  WBC 12.7* 10.0 10.7* 10.7* -- --  LATICACIDVEN -- -- -- -- -- --   Erythrocyte Sedimentation Rate     Component Value Date/Time   ESRSEDRATE 90* 02/23/2012 1749    PCXR: No sig changes. Has diffuse bilateral airspace disease.   IMPRESSION:   Pulmonary Progressive acute respiratory failure in the setting of worsening pulmonary infiltrates. ? Chemo related pneumonitis-->although has not improved w/ steroids.  ? Volume overload: seems doubtful  ? Infectious vs auto-immune: autoimmune neg to date.   On 03/04/2012:  Clinically no change. Cont to  be hypoxemic requiring 60% oxygen (she still rapidly desaturates) . Rhinovirus positive. Clinical picture consistent with acute lung injury secondary to bad respiratory viral infection Plan: Cont Diurese as tolerated Discontinue Steroids ; doubt there is a role respiratory virus-related acute lung injury  BiPAP each bedtime and facemask oxygen in the day Keep SPO2>88% Mobilize This is going to take a long time to resolve   Cardiac Hypertension - Controlled on PO meds  Plan Cont PO meds  Infectious disease Acute lung injury did do to respiratory virus rhinovirus infection . No evidence of superimposed bacterial infection  Plan F/u cultures    Renal Normal   Plan F/u chem Strict I&O  Endocrine  Thyroid  function test WNL BG controlled, will check CBG q 4, add  insulin if elevated.  Plan F/u chem  GI Adv as tol  hematology  Anemia: Likely chronic disease. No evidence of bleeding. H&H stable DVT (right [place] vein): was on coumadin for this Coumadin induced coagulopathy: INR increased to >10 on 1/23 got Vit K, on 1/24 1.47 Fallopian tube carcinoma  Plan Coumadin per pharmacy   Neuro Intact  Plan Supportive care  Simonne Martinet, NP  Will continue to diurese and taper down O2, no role for additional steroids at this time.  Will need time for lungs to improve slowly with time.  BiPAP on a PRN bases and at night.  CC time 35 min.  Patient seen and examined, agree with above note.  I dictated the care and orders written for this patient under my direction.  Alyson Reedy, MD [phone]

## 2012-03-04 NOTE — Progress Notes (Signed)
ANTICOAGULATION CONSULT NOTE   Pharmacy Consult for Coumadin Indication: Right Subclavian DVT  Allergies  Allergen Reactions  . Lisinopril Cough  . Codeine Itching  . Oxycodone Nausea Only  . Vicodin (Hydrocodone-Acetaminophen) Nausea Only   Labs:  Basename 03/04/12 0350 03/03/12 0820 03/03/12 0625  HGB 9.8* -- --  HCT 29.7* -- --  PLT 194 -- --  APTT -- -- --  LABPROT 20.5* 25.1* QUESTIONABLE RESULTS, RECOMMEND RECOLLECT TO VERIFY  INR 1.83* 2.41* NOT CALCULATED  HEPARINUNFRC -- -- --  CREATININE 0.82 -- --  CKTOTAL -- -- --  CKMB -- -- --  TROPONINI -- -- --   Warfarin doses administered 1/24 - 1/27: 2.5, 2.5, 2, held.  Assessment:  57 yo F on chronic anticoagulation with warfarin for right subclavian DVT, admitted with respiratory failure of uncertain etiology.  Coumadin was continued on admission with pharmacy dosing and daily INR.  On 1/22 INR rose sharply, then continued to rise further despite withholding warfarin.  INR >10 on 1/23 - vitamin K 10mg  PO and 10mg  IV were administered and reversed INR to 1.47.  Cautious re-initiation of warfarin in progress.  INR back down quickly after holding one dose. INR has been erratic but anticipate she will be less sensitive off abx and with diet advanced.  CBC stable. No bleeding reported.   Diet advanced to [facility] yest.  Goal of Therapy:  INR 2-3   Plan:   Give warfarin 2.5mg  today at noon.   PT/INR daily.  Charolotte Eke, PharmD, pager [phone]. 03/04/2012,10:09 AM.

## 2012-03-05 ENCOUNTER — Other Ambulatory Visit: Payer: Medicare Other | Admitting: Lab

## 2012-03-05 ENCOUNTER — Encounter (HOSPITAL_COMMUNITY): Payer: Medicare Other

## 2012-03-05 LAB — CBC
HCT: 29.5 % — ABNORMAL LOW (ref 36.0–46.0)
Hemoglobin: 9.7 g/dL — ABNORMAL LOW (ref 12.0–15.0)
MCHC: 32.9 g/dL (ref 30.0–36.0)
RBC: 3.21 MIL/uL — ABNORMAL LOW (ref 3.87–5.11)
WBC: 14.2 10*3/uL — ABNORMAL HIGH (ref 4.0–10.5)

## 2012-03-05 LAB — COMPREHENSIVE METABOLIC PANEL
ALT: 17 U/L (ref 0–35)
Alkaline Phosphatase: 131 U/L — ABNORMAL HIGH (ref 39–117)
BUN: 26 mg/dL — ABNORMAL HIGH (ref 6–23)
Chloride: 91 mEq/L — ABNORMAL LOW (ref 96–112)
GFR calc Af Amer: 80 mL/min — ABNORMAL LOW (ref >90)
Glucose, Bld: 130 mg/dL — ABNORMAL HIGH (ref 70–99)
Potassium: 4.4 mEq/L (ref 3.5–5.1)
Sodium: 136 mEq/L (ref 135–145)
Total Bilirubin: 0.5 mg/dL (ref 0.3–1.2)
Total Protein: 5.8 g/dL — ABNORMAL LOW (ref 6.0–8.3)

## 2012-03-05 LAB — LEGIONELLA CULTURE

## 2012-03-05 LAB — PROTIME-INR
INR: 1.89 — ABNORMAL HIGH (ref 0.00–1.49)
Prothrombin Time: 21 seconds — ABNORMAL HIGH (ref 11.6–15.2)

## 2012-03-05 MED ORDER — WARFARIN SODIUM 3 MG PO TABS
3.0000 mg | ORAL_TABLET | Freq: Once | ORAL | Status: AC
  Administered 2012-03-05: 3 mg via ORAL
  Filled 2012-03-05: qty 1

## 2012-03-05 MED ORDER — CHOLECALCIFEROL 75 MCG (3000 UT) PO TABS: 3000.0000 [IU] | ORAL_TABLET | Freq: Every day | ORAL | Status: AC

## 2012-03-05 MED ORDER — WARFARIN - PHARMACIST DOSING INPATIENT: 1.0000 | Freq: Every day | Status: AC

## 2012-03-05 MED ORDER — METOPROLOL SUCCINATE ER 200 MG PO TB24: 200.0000 mg | ORAL_TABLET | Freq: Every day | ORAL | Status: AC

## 2012-03-05 MED ORDER — AMLODIPINE BESYLATE 5 MG PO TABS: 5.0000 mg | ORAL_TABLET | Freq: Every day | ORAL | Status: AC

## 2012-03-05 MED ORDER — WARFARIN SODIUM 3 MG PO TABS: 3.0000 mg | ORAL_TABLET | Freq: Once | ORAL | Status: AC

## 2012-03-05 MED ORDER — SODIUM CHLORIDE 0.9 % IJ SOLN: 3.0000 mL | Freq: Two times a day (BID) | INTRAMUSCULAR | Status: AC

## 2012-03-05 MED ORDER — ACETAMINOPHEN 325 MG PO TABS: 650.0000 mg | ORAL_TABLET | Freq: Four times a day (QID) | ORAL | Status: AC | PRN

## 2012-03-05 MED ORDER — PROCHLORPERAZINE 25 MG RE SUPP: 25.0000 mg | Freq: Two times a day (BID) | RECTAL | Status: AC | PRN

## 2012-03-05 MED ORDER — POTASSIUM CHLORIDE 20 MEQ/15ML (10%) PO LIQD: 30.0000 meq | Freq: Two times a day (BID) | ORAL | Status: AC

## 2012-03-05 MED ORDER — PHENYLEPHRINE HCL 0.25 % NA SOLN: 1.0000 | Freq: Four times a day (QID) | NASAL | Status: AC | PRN

## 2012-03-05 MED ORDER — BENZONATATE 200 MG PO CAPS: 200.0000 mg | ORAL_CAPSULE | Freq: Three times a day (TID) | ORAL | Status: AC | PRN

## 2012-03-05 NOTE — Progress Notes (Signed)
ANTICOAGULATION CONSULT NOTE   Pharmacy Consult for Coumadin Indication: Right Subclavian DVT  Allergies  Allergen Reactions  . Lisinopril Cough  . Codeine Itching  . Oxycodone Nausea Only  . Vicodin (Hydrocodone-Acetaminophen) Nausea Only   Labs:  Basename 03/05/12 0346 03/04/12 0350 03/03/12 0820  HGB 9.7* 9.8* --  HCT 29.5* 29.7* --  PLT 167 194 --  APTT -- -- --  LABPROT 21.0* 20.5* 25.1*  INR 1.89* 1.83* 2.41*  HEPARINUNFRC -- -- --  CREATININE 0.90 0.82 --  CKTOTAL -- -- --  CKMB -- -- --  TROPONINI -- -- --   Warfarin doses administered 1/24 - 1/28: 2.5, 2.5, 2, held, 2.5mg   Assessment:  57 yo F on chronic anticoagulation with warfarin for right subclavian DVT, admitted with respiratory failure of uncertain etiology, hx of pumonary fibrosis.  Coumadin was continued on admission with pharmacy dosing and daily INR.  On 1/22 INR rose sharply, then continued to rise further despite withholding warfarin.  INR >10 on 1/23 - vitamin K 10mg  PO and 10mg  IV were administered and reversed INR to 1.47.  Cautious re-initiation of warfarin in progress.  INR back down quickly after holding one dose 1/27. INR has been erratic but anticipate she will be less sensitive off abx and with diet advanced.  CBC stable. No bleeding reported.   Diet advanced to [facility] yest.  Goal of Therapy:  INR 2-3   Plan:   Give warfarin 3 mg today at noon.   PT/INR daily.  Otho Bellows PharmD Pager [phone] 03/05/2012 10:06 AM

## 2012-03-05 NOTE — Progress Notes (Signed)
[name] is a 57 year old woman with a past medical history of pulmonary fibrosis, breast cancer, and fallopian tube cancer, last chemotherapy 02/07/2012, was admitted to the hospital on 02/19/2012 with acute respiratory failure. A CT angiogram was done on 02/19/2012 which showed progression of pulmonary fibrosis (? Post Viral ARDS) . The pulmonary team has been consulting. Bronchoscopy was performed and patient had BAL which came back positive for rhinovirus. Given her underlying pulmonary disease she is expected to have a slow recovery. As such case discussed with PCCM Anders Simmonds they are taking over her care, Hospitalist will sign off.

## 2012-03-05 NOTE — Discharge Summary (Signed)
Physician Discharge Summary     Patient ID: Abigail [name] MRN: [id number] DOB/AGE: 1955-03-31 57 y.o.  Admit date: 02/19/2012 Discharge date: 03/05/2012  Admission Diagnoses: Acute respiratory failure  Discharge Diagnoses:  Principal Problem:  *Acute respiratory failure Active Problems:  Fallopian tube cancer, BRCA2 positive  Hyponatremia  Anemia  Sinus tachycardia  ILD (interstitial lung disease)  DVT (deep venous thrombosis)  SOB (shortness of breath)  Hypomagnesemia  Hypokalemia  Acute respiratory failure with hypoxia  Healthcare-associated pneumonia  Vitamin B 12 deficiency  Anemia of chronic disease  Elevated INR  Disease due to rhinovirus   Significant Hospital tests/ studies/ interventions and procedures  Cultures  ESR 01/18/2014n >> high at 94  Autoimmune antibody 02/23/2012 and January 21> neg  Influenza A 1/21 - NEG  Influenza B 1/21 - NEG  H1N1 1/21 - NEG  Urine Culture - 1/19 - No Growth  Blood Culturex2 1/18>>>  Bronnch BAL RML 03/01/2012 by Dr. Marchelle Gearing >> 85% neutrophils. RHINOVIRUS POSITIVE. PCP, AFB, Fungal , Gram stain negative   Antibiotics  Zosyn 1/17>>>1/22  Vancomycin 1/20>>>1/22  Levofloxacin 1/21>>>1/22  Ceftaz 1/23>>> off  azithro 1/23>>> off   EVENTS  03/01/2012 : She continues to improve clinically but her oxygen requirements are high. She has been on facemask - 60% and not cycling on/off NIV-Mask.    BRIEF The patient is a very pleasant 57 year old female who PCCM has been asked to see for an abnormal CT of chest as well as worsening dyspnea on exertion. 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 She most recently had a round of chemotherapy the beginning of this month of Jan 2014.  Of note, She is known to have non specific ILD findings 2009-> 2013 that were stable and with calcified hilar nodes. In June 2013 when last seen by pccm in office she had dyspnea  out of proportion to CT findings. Dyspnea felt due to be deconditoning, tachycardia (ultimate cardiac wokrup negative), obesity as prime factors. Summer 2013- PE was also ruled out. After that time in summer 2013 she did not fu with PCCM; a PFT had been scheduled   Hospital Course:   Progressive acute respiratory failure in the setting of worsening pulmonary infiltrates/ ARDS s/p viral pneumonitis/PNA (rhinovirus)  We had seen initially in consult for ongoing dyspnea and pulmonary infiltrates. Diff dx on initial eval considered were as follows: chemotherapy related pneumonitis, CAP w/ ALI/ARDS, edema, or viral pneumonitis. Following admit she was empirically treated with broad spectrum antibiotics and supplemental oxygen. She did not improve either clinically or radiographically. Given high concern for pneumonitis and elevated ESR w/ high risk for intubation we decided to try systemic steroid trial She was placed on 80mg  IV every 8 hours for 72 hours. This did not assist with any significant improvement. Because of this we decided to go ahead with FOB to eval further. We obtained BAL from FOB and obtained respiratory viral panel. This demonstrated rhinovirus. Given these findings we felt that the primary etiology of her infiltrates are probably acute fribroproliferative ARDS super-imposed on some degree of chronic fibrosis and complicated by deconditioning and obesity. We stopped abx 1/26 also steroids stopped as not responsive to trial. At this point we have been continuing supplemental high flow oxygen, weaning BIPAP and diuresis.Her CXR and clinical status has not improved significantly.  It is our belief that her recovery will simply take time and she will almost certainly be oxygen  dependent indefinitely. It is our hope that with time her oxygen requirements will improve. In the mean time we believe she will benefit from LTAC level of care as she is chronically ill with still high risk for decompensation  (desaturates rapidly when oxygen titrated). Think that she would benefit from high flow nasal cannula oxygen delivery device that our organization does not have.  Plan:  Cont Diurese as tolerated  BiPAP each bedtime and facemask oxygen in the day Keep SPO2>88%  Mobilize  We will be happy to see again after discharge.  Dr Marchelle Gearing [phone]   Hypertension - Controlled on PO meds  Plan  Cont PO meds   Anemia: Likely chronic disease. No evidence of bleeding. H&H stable  DVT (right [place] vein): treated w/ coumadin  Coumadin induced coagulopathy: INR increased to >10 on 1/23 got Vit K, on 1/24 1.47  Fallopian tube carcinoma  Plan  Coumadin per pharmacy  [facility].   Discharge Exam: BP 116/70  Pulse 97  Temp 98.9 F (37.2 C) (Axillary)  Resp 32  Ht 5\' 1"  (1.549 m)  Wt 98.4 kg (216 lb 14.9 oz)  BMI 40.99 kg/m2  SpO2 96% 60%  HEENT: Pupils are equal, round, and reactive to light. Extraocular muscles are intact. Oropharynx is clear.  CHEST: Crackles, diminished in bases.  CARDIAC: regular rhythm, no murmurs, rubs, or gallops.  ABDOMEN: Soft, nontender with hypoactive bowel sounds.  EXTREMITIES: Lower extremities with mild edema and weak peripheral pulses, no cyanosis.  NEUROLOGIC: She is alert and oriented x4, and moves all four extremities. No focal deficits.   Labs at discharge Lab Results  Component Value Date   CREATININE 0.90 03/05/2012   BUN 26* 03/05/2012   NA 136 03/05/2012   K 4.4 03/05/2012   CL 91* 03/05/2012   CO2 38* 03/05/2012   Lab Results  Component Value Date   WBC 14.2* 03/05/2012   HGB 9.7* 03/05/2012   HCT 29.5* 03/05/2012   MCV 91.9 03/05/2012   PLT 167 03/05/2012   Lab Results  Component Value Date   ALT 17 03/05/2012   AST 14 03/05/2012   ALKPHOS 131* 03/05/2012   BILITOT 0.5 03/05/2012   Lab Results  Component Value Date   INR 1.89* 03/05/2012   INR 1.83* 03/04/2012   INR 2.41* 03/03/2012   PROTIME 21.6* 02/07/2012    PROTIME 24.0* 01/11/2012   PROTIME 25.2* 12/14/2011    Current radiology studies Dg Chest Port 1 View  03/04/2012  *RADIOLOGY REPORT*  Clinical Data: Evaluate ARDS  PORTABLE CHEST - 1 VIEW  Comparison: 03/01/2012; 02/19/2012; 02/25/2012; chest CT - 02/19/2012  Findings: Grossly unchanged enlarged cardiac silhouette and mediastinal contours. Stable position support apparatus.  Grossly unchanged extensive bilateral heterogeneous air space opacities with slight right upper and left lower lobe predominance.  Lung volumes remain reduced.  Trace bilateral effusions are not excluded.  No pneumothorax.  Unchanged bones.  IMPRESSION: Grossly unchanged extensive bilateral airspace opacities with differential considerations including multifocal infection, alveolar pulmonary edema and/or ARDS   Original Report Authenticated By: Tacey Ruiz, MD     Disposition:  01-Home or Self Care      Discharge Orders    Future Appointments: Provider: Department: Dept Phone: Center:   03/10/2012 12:00 PM Lowella Dell, MD [facility] MEDICAL ONCOLOGY [phone] None     Future Orders Please Complete By Expires   Diet - low sodium heart healthy      Increase  activity slowly          Medication List     As of 03/05/2012 10:35 AM    STOP taking these medications         amoxicillin-clavulanate 875-125 MG per tablet   Commonly known as: AUGMENTIN      dexamethasone 4 MG tablet   Commonly known as: DECADRON      KLOR-CON M20 20 MEQ tablet   Generic drug: potassium chloride SA      lidocaine-prilocaine cream   Commonly known as: EMLA      ondansetron 8 MG tablet   Commonly known as: ZOFRAN      PRESCRIPTION MEDICATION      prochlorperazine 10 MG tablet   Commonly known as: COMPAZINE      TAKE these medications         acetaminophen 325 MG tablet   Commonly known as: TYLENOL   Take 2 tablets (650 mg total) by mouth every 6 (six) hours as needed (or Fever >/= 101).       amLODipine 5 MG tablet   Commonly known as: NORVASC   Take 1 tablet (5 mg total) by mouth daily.      benzonatate 200 MG capsule   Commonly known as: TESSALON   Take 1 capsule (200 mg total) by mouth 3 (three) times daily as needed for cough.      Cholecalciferol 3000 UNITS Tabs   Take 3,000 Units by mouth daily.      metoprolol 200 MG 24 hr tablet   Commonly known as: TOPROL-XL   Take 1 tablet (200 mg total) by mouth daily. Take with or immediately following a meal.      phenylephrine 0.25 % nasal spray   Commonly known as: NEO-SYNEPHRINE   Place 1 spray into the nose every 6 (six) hours as needed for congestion.      potassium chloride 20 MEQ/15ML (10%) solution   Take 22.5 mLs (30 mEq total) by mouth every 12 (twelve) hours.      prochlorperazine 25 MG suppository   Commonly known as: COMPAZINE   Place 1 suppository (25 mg total) rectally every 12 (twelve) hours as needed.      sodium chloride 0.9 % injection   Inject 3 mLs into the vein every 12 (twelve) hours.      Warfarin - Pharmacist Dosing Inpatient Misc   1 each by Does not apply route daily at 6 PM.      warfarin 3 MG tablet   Commonly known as: COUMADIN   Take 1 tablet (3 mg total) by mouth once.          Discharged Condition: {fair Physician Statement:   The Patient was personally examined, the discharge assessment and plan has been personally reviewed and I agree with ACNP Rogene Meth's assessment and plan. > 30 minutes of time have been dedicated to discharge assessment, planning and discharge instructions.   Signed: Dynasia Kercheval,PETE 03/05/2012, 10:35 AM

## 2012-03-06 ENCOUNTER — Telehealth: Payer: Self-pay | Admitting: *Deleted

## 2012-03-06 NOTE — Telephone Encounter (Signed)
This RN spoke with pt's dtr, Celine Mans, per call regarding scheduling her Celine Mans ) for transfer of care to Dr Darnelle Catalan.  Celine Mans states pt was admitted earlier this month and is " not doing well ". Pt is currently at [facility]. Per Celine Mans " she is breathing by wearing a mask all the time and tomorrow the doctor said they would remove the mask and put a breathing down her throat and next week they would put it in her throat ".  Per discussion appointment for 03/10/2012 will be cancelled. Celine Mans is being scheduled for appointment to be established with Dr Darnelle Catalan.  This RN informed Celine Mans her mother's situation will be followed up with her at that time.

## 2012-03-10 ENCOUNTER — Ambulatory Visit: Payer: Medicare Other | Admitting: Oncology

## 2012-03-12 ENCOUNTER — Encounter: Payer: Self-pay | Admitting: *Deleted

## 2012-03-12 NOTE — Progress Notes (Signed)
Pt's daughter called stating that they are making funeral arrangement for her mother and she had questions about a cancer policy.  Instructed her to call Abigail for further direction.  She then stated that she was told that the chemo called the problems that her mom had which is causing her to pass and I directed her to [facility].

## 2012-03-18 ENCOUNTER — Other Ambulatory Visit: Payer: Self-pay | Admitting: Oncology

## 2012-03-24 LAB — FUNGUS CULTURE W SMEAR: Fungal Smear: NONE SEEN

## 2012-03-31 NOTE — Discharge Summary (Signed)
Patient seen and examined, agree with above note.  I dictated the care and orders written for this patient under my direction.  Wesam G Yacoub, MD [phone] 

## 2012-04-05 DEATH — deceased

## 2012-04-12 LAB — AFB CULTURE WITH SMEAR (NOT AT ARMC): Acid Fast Smear: NONE SEEN

## 2012-04-17 ENCOUNTER — Telehealth: Payer: Self-pay | Admitting: Family Medicine

## 2012-04-17 NOTE — Telephone Encounter (Signed)
Pt's daughter-in-law dropped off a form to be completed. I am sending back in your folder. Please call felicia lipscomb,daughter-in-law, at [phone] when form is completed.

## 2013-03-11 ENCOUNTER — Other Ambulatory Visit: Payer: Self-pay | Admitting: Oncology

## 2013-04-25 IMAGING — CT CT ANGIO CHEST
2 of 6 series · 17 of 36 positions shown · IV contrast (APPLIED)
Comparison: CT of chest 10/24/2010.

CLINICAL DATA: Shortness of breath.  Evaluate for pulmonary
embolism.  History of breast cancer.

CT ANGIOGRAPHY CHEST
TECHNIQUE: Multidetector CT imaging of the chest using the
standard protocol during bolus administration of intravenous
contrast. Multiplanar reconstructed images including MIPs were
obtained and reviewed to evaluate the vascular anatomy.
Contrast: 80mL OMNIPAQUE IOHEXOL 300 MG/ML  SOLN

[Series 6: pe thins @ 1mm · axial · 0.74mm/px · z∈[-168,+60]mm · 16 of 254 slices shown]
[im 13/254  lung]
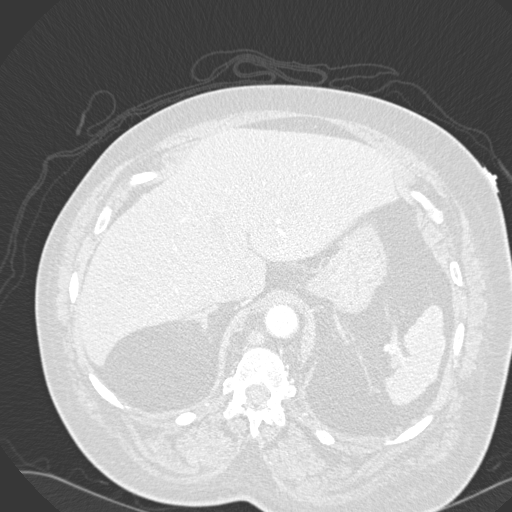
[im 26/254  mediastinal]
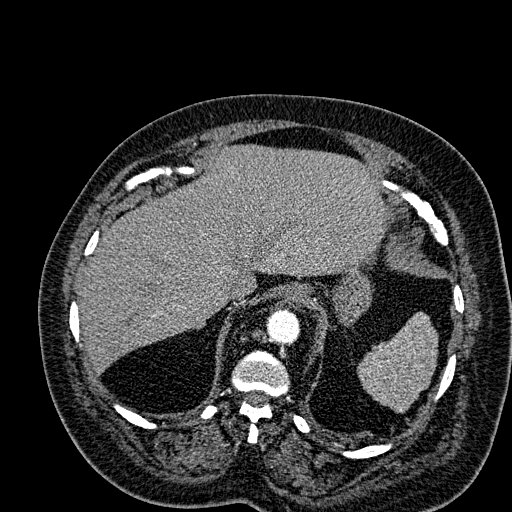
[im 38/254  lung]
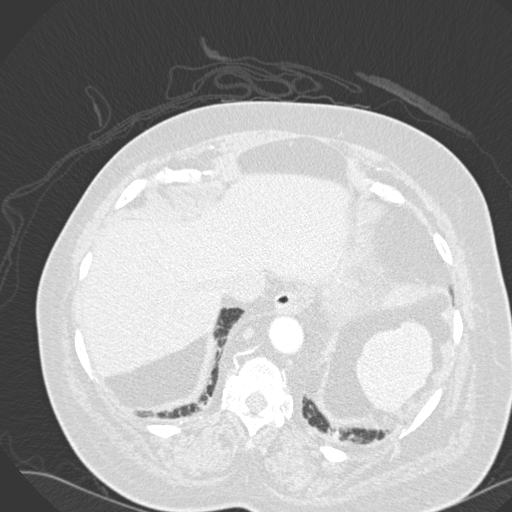
[im 64/254  mediastinal]
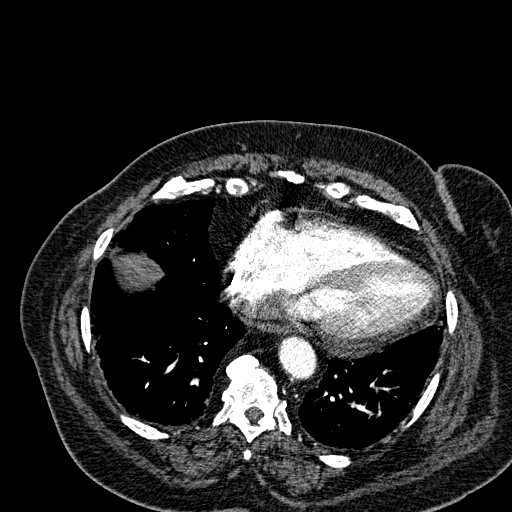
[im 76/254  lung]
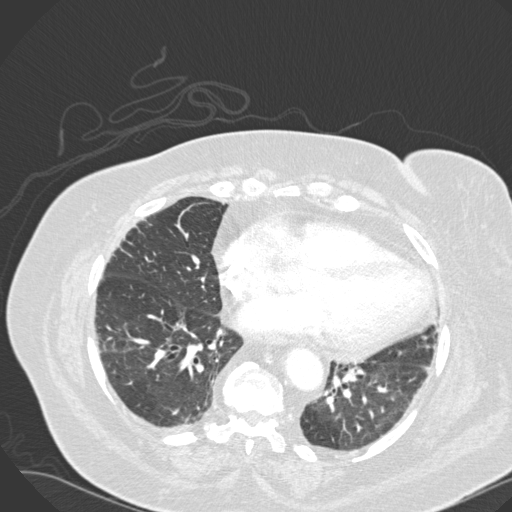
[im 89/254  mediastinal]
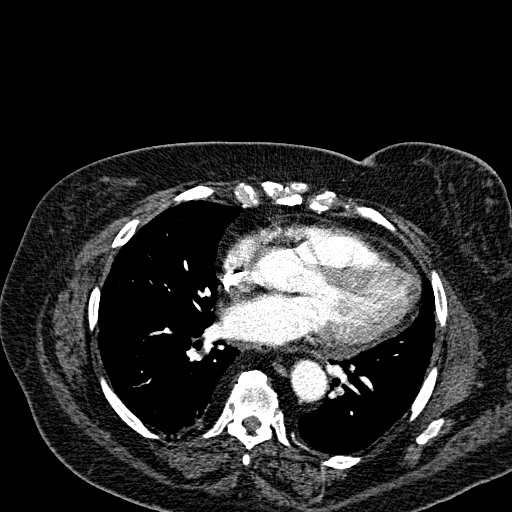
[im 102/254  lung]
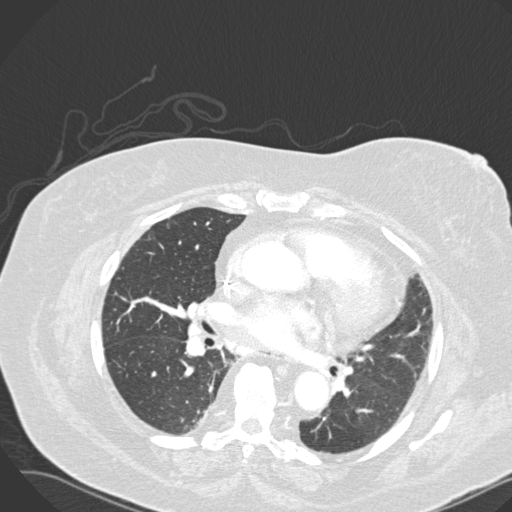
[im 114/254  mediastinal]
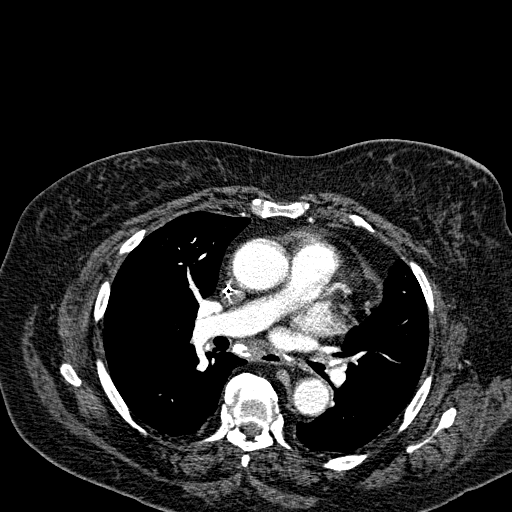
[im 140/254  lung]
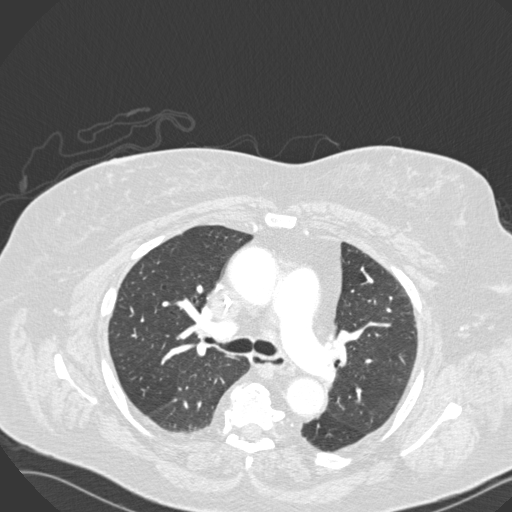
[im 152/254  mediastinal]
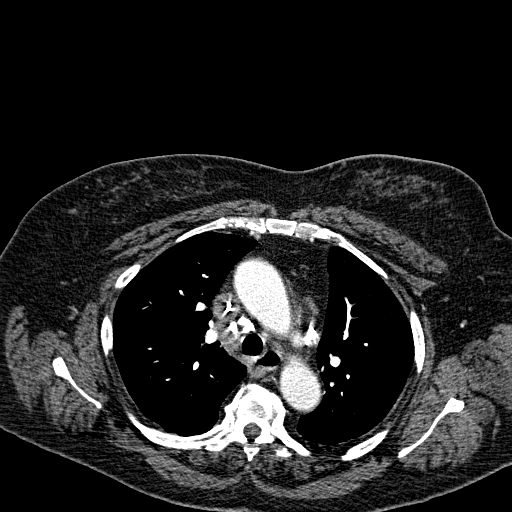
[im 165/254  lung]
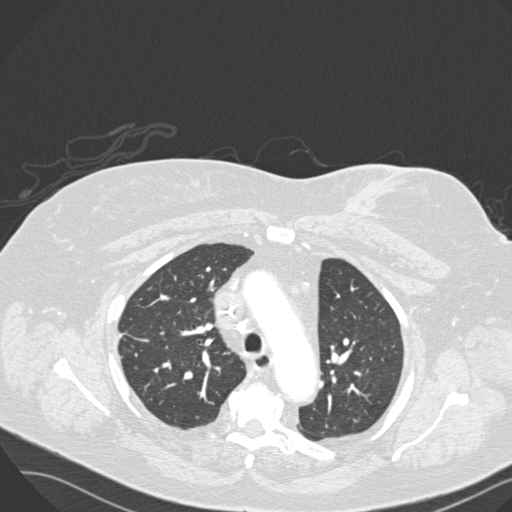
[im 178/254  mediastinal]
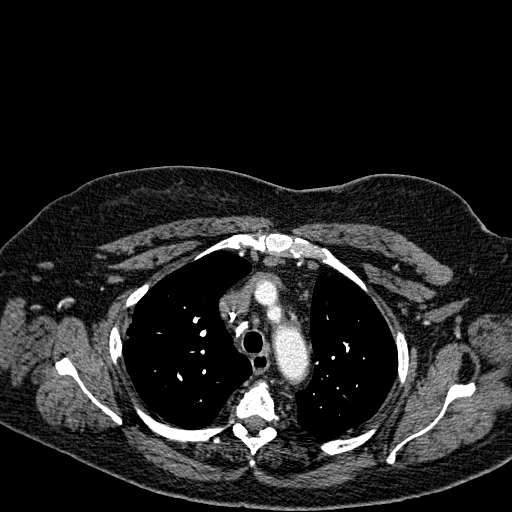
[im 190/254  lung]
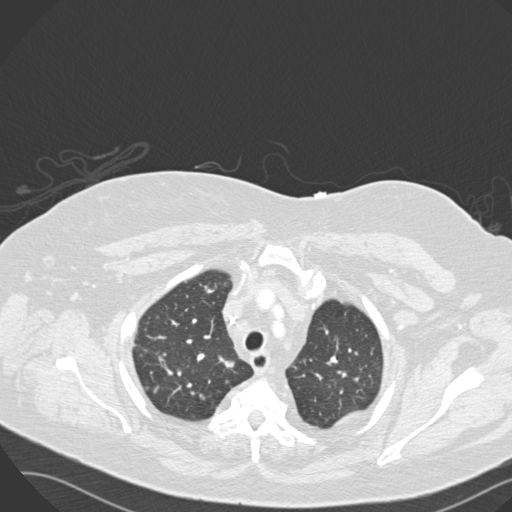
[im 216/254  mediastinal]
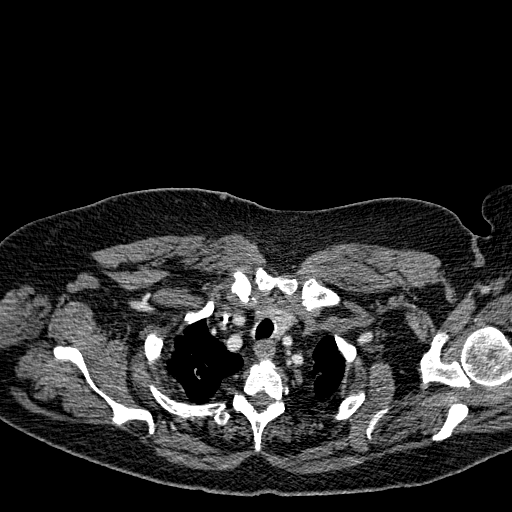
[im 228/254  lung]
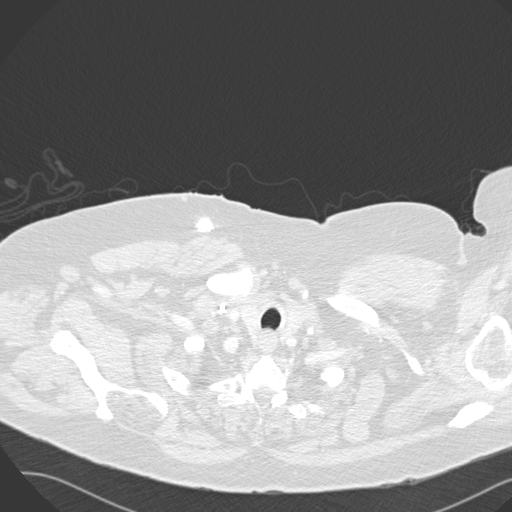
[im 241/254  mediastinal]
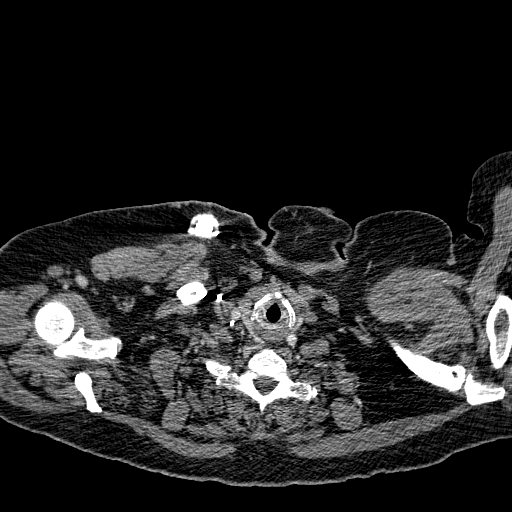

[Series 602: cor · coronal · 0.74mm/px · 1 of 104 slices shown]
[im 52/104  mediastinal]
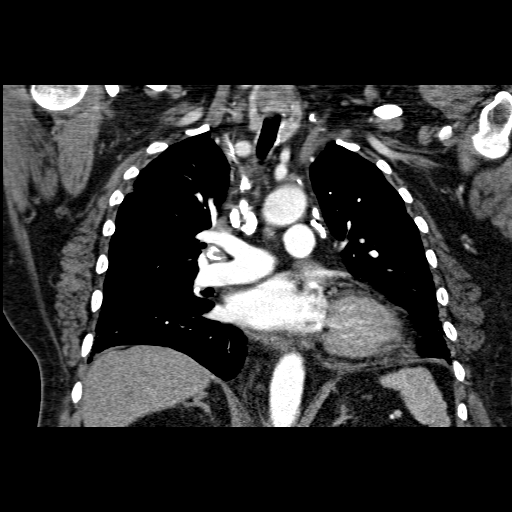

[17 of 36 positions shown; findings below may reference images not displayed]

FINDINGS: Mediastinum: There are no filling defects within the pulmonary
arterial tree to suggest underlying pulmonary embolism. Heart size
is borderline enlarged. Trace amount of pericardial fluid and / or
thickening, unlikely to be of any hemodynamic significance at this
time.  No associated pericardial calcification.  There are numerous
calcified mediastinal and bilateral hilar lymph nodes.  No definite
pathologically enlarged noncalcified mediastinal or hilar lymph
nodes are appreciated.  There is a new right internal jugular
single lumen Port-A-Cath with tip terminating in the right atrium.
Esophagus is unremarkable in appearance.  Borderline enlarged (10
mm short axis) retrocrural lymph nodes are incidentally noted, of
uncertain etiology and significance.

Lungs/Pleura: There are some patchy regions of peripheral
subpleural reticulation, most pronounced in the lung bases.
Additionally, in the inferior aspect of the lower lobes of the
lungs bilaterally there is more profound reticulation with some
associated traction bronchiectasis and peripheral bronchiolectasis.
No acute airspace consolidation.  No pleural effusions.  The apices
of the lungs bilaterally there is extensive peripheral
micronodularity and pleural thickening, likely reflect post
infectious scarring.  The largest single nodule measures
approximately 6 mm in the right apex (image 22 of series 7).  No
other larger more suspicious appearing pulmonary nodules or masses
are otherwise identified.

Upper Abdomen: Unremarkable.

Musculoskeletal: There are no aggressive appearing lytic or blastic
lesions noted in the visualized portions of the skeleton.
IMPRESSION: 1.  No evidence of pulmonary embolism.
2.  No acute findings in the visualized thorax to account for the
patient's symptoms.
3.  However, the appearance of the lungs is suggestive of an
underlying interstitial lung disease.  The pattern is not
definitive at this time, but this may represent an early
manifestation of usual interstitial pneumonia (UIP).  Attention on
follow-up high-resolution CT scans would be of use to monitor for
progression of disease.
4.  Sequelae of old granulomatous disease with extensive bilateral
hilar and mediastinal calcified lymphadenopathy.
5.  Bilateral apical peripheral micronodularity, with the largest
single nodule measuring approximately 6 mm in the right upper lobe.
These findings are nonspecific, and favored to reflect post
infectious scarring.  This pattern is not typical for metastatic
disease. If the patient is at high risk for bronchogenic carcinoma,
follow-up chest CT at 6-12 months is recommended.  If the patient
is at low risk for bronchogenic carcinoma, follow-up chest CT at 12
months is recommended.  This recommendation follows the consensus
statement: Guidelines for Management of Small Pulmonary Nodules
Detected on CT Scans: A Statement from the [HOSPITAL] as
published in Radiology 3558; [DATE].  This follow-up CT scan
should be performed as a high resolution chest CT to moderate for
progression of interstitial lung disease.

## 2013-09-29 ENCOUNTER — Other Ambulatory Visit: Payer: Self-pay | Admitting: *Deleted

## 2013-09-29 ENCOUNTER — Encounter: Payer: Self-pay | Admitting: *Deleted

## 2013-12-12 IMAGING — CR DG CHEST 1V PORT
1 series · 1 of 1 positions shown · non-contrast
Comparison: Chest radiograph performed 02/25/2012

CLINICAL DATA: Worsening shortness of breath; difficulty breathing.

PORTABLE CHEST - 1 VIEW

[AP]
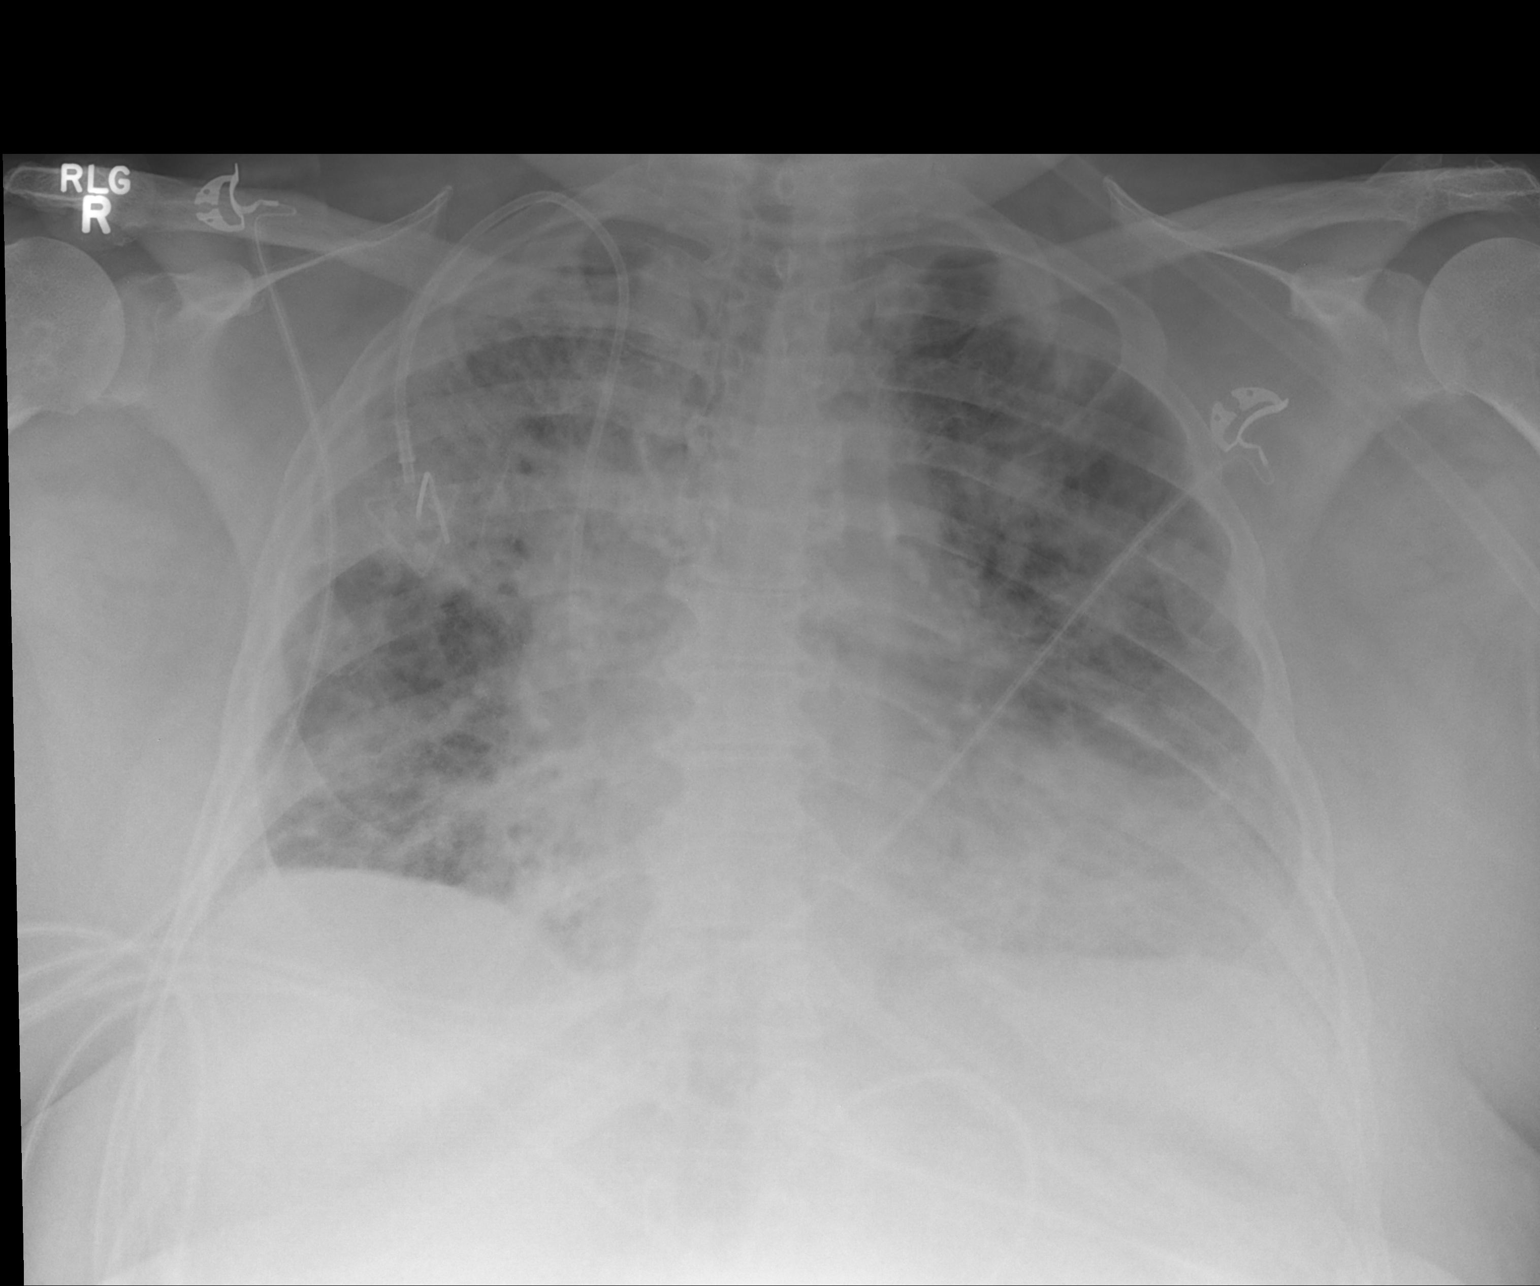

[1 of 1 positions shown; findings below may reference images not displayed]

FINDINGS: Diffuse bilateral airspace opacification is noted,
perhaps slightly worsened from the prior study.  This is concerning
for mildly worsening multifocal pneumonia.  No pleural effusion or
pneumothorax is seen.

The cardiomediastinal silhouette is mildly enlarged.  No acute
osseous abnormalities are identified.
IMPRESSION: 1.  Diffuse bilateral airspace opacification, perhaps slightly
worsened from the prior study.  This is concerning for severe
mildly worsening multifocal pneumonia.
2.  Mild cardiomegaly.

## 2013-12-13 IMAGING — CR DG CHEST 1V PORT
1 series · 1 of 1 positions shown · non-contrast
Comparison: Portable exam 3929 hours compared to 02/26/2012

CLINICAL DATA: Weakness, shortness of breath, follow up infiltrates

PORTABLE CHEST - 1 VIEW

[AP]
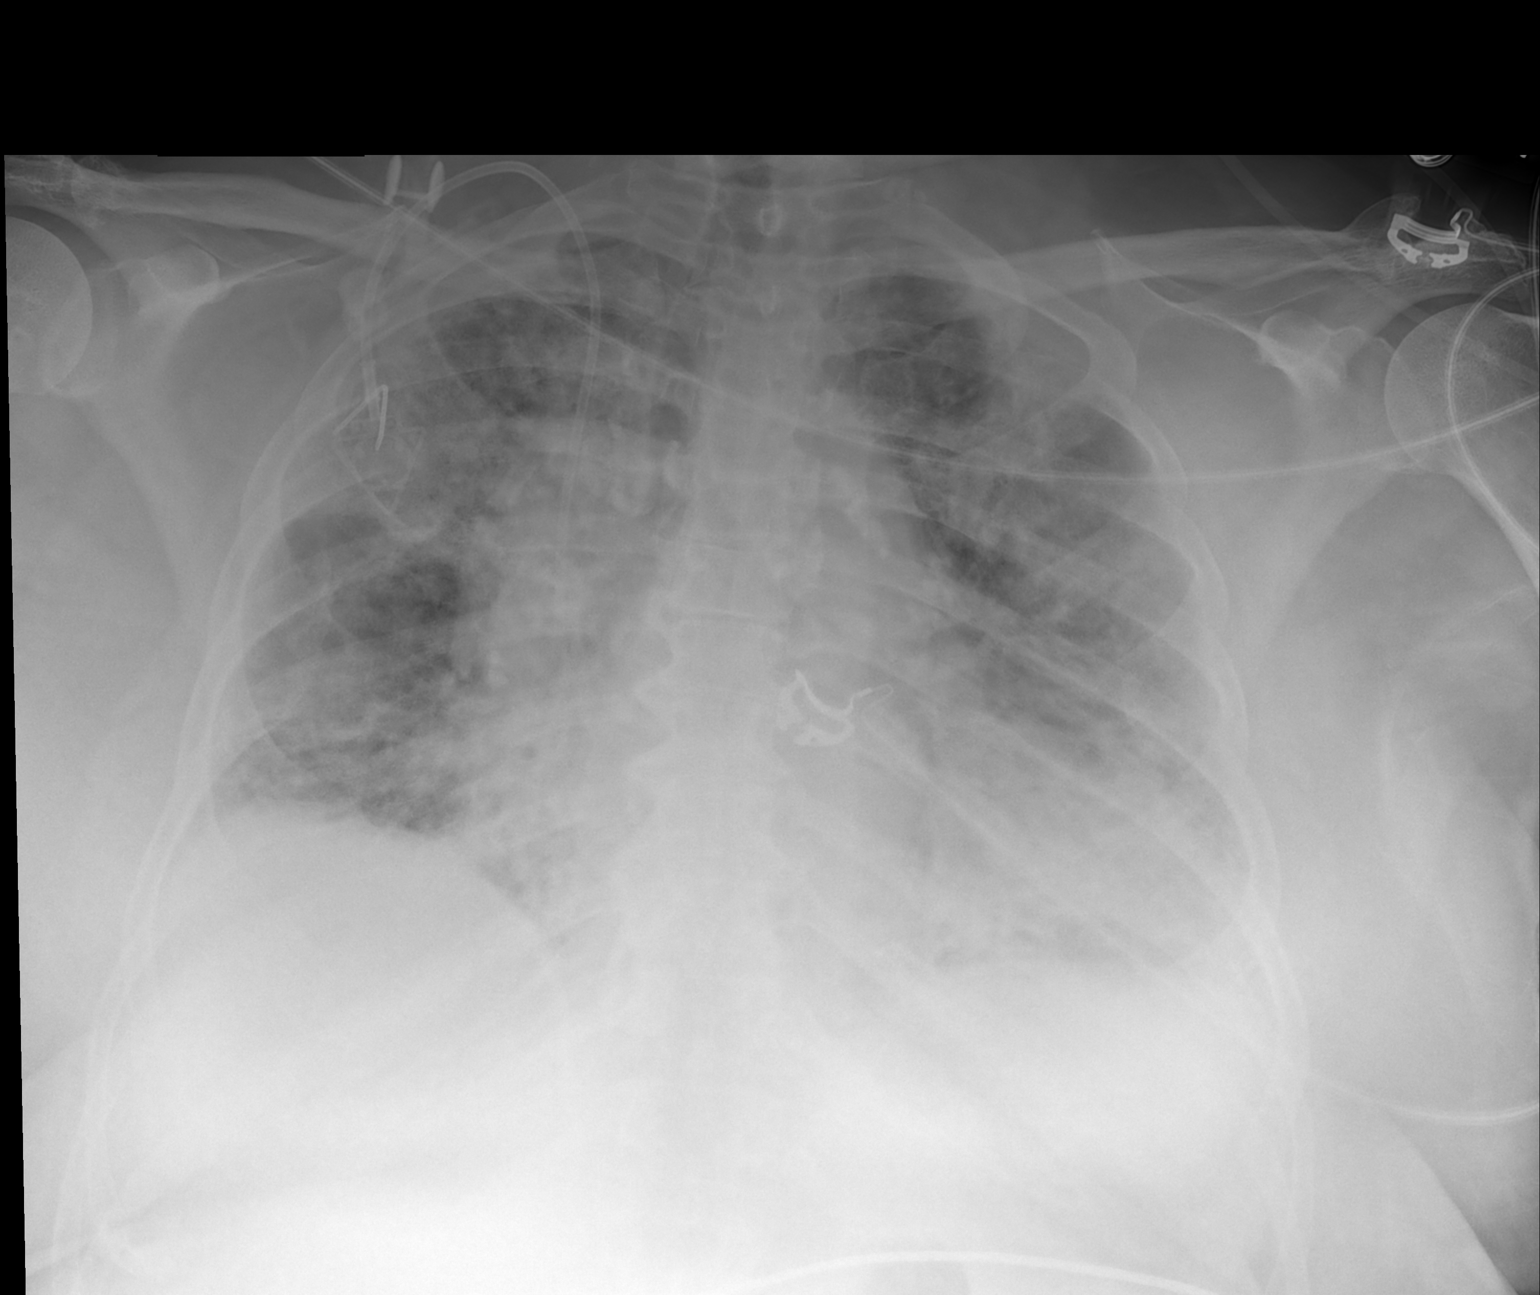

[1 of 1 positions shown; findings below may reference images not displayed]

FINDINGS: Right jugular Port-A-Cath tip projecting over SVC.
Enlargement of cardiac silhouette with pulmonary vascular
congestion.
Persistent bilateral diffuse airspace infiltrates, focally greatest
in right upper lobe.
These appear little changed since previous study.
Slightly increased bibasilar atelectasis.
No pneumothorax.
IMPRESSION: Persistent bilateral airspace infiltrates and slightly increased
bibasilar atelectasis.

## 2013-12-19 IMAGING — CR DG CHEST 1V PORT
1 series · 1 of 1 positions shown · non-contrast
Comparison: 03/01/2012; 02/19/2012; 02/25/2012; chest CT -
02/19/2012

CLINICAL DATA: Evaluate ARDS

PORTABLE CHEST - 1 VIEW

[AP]
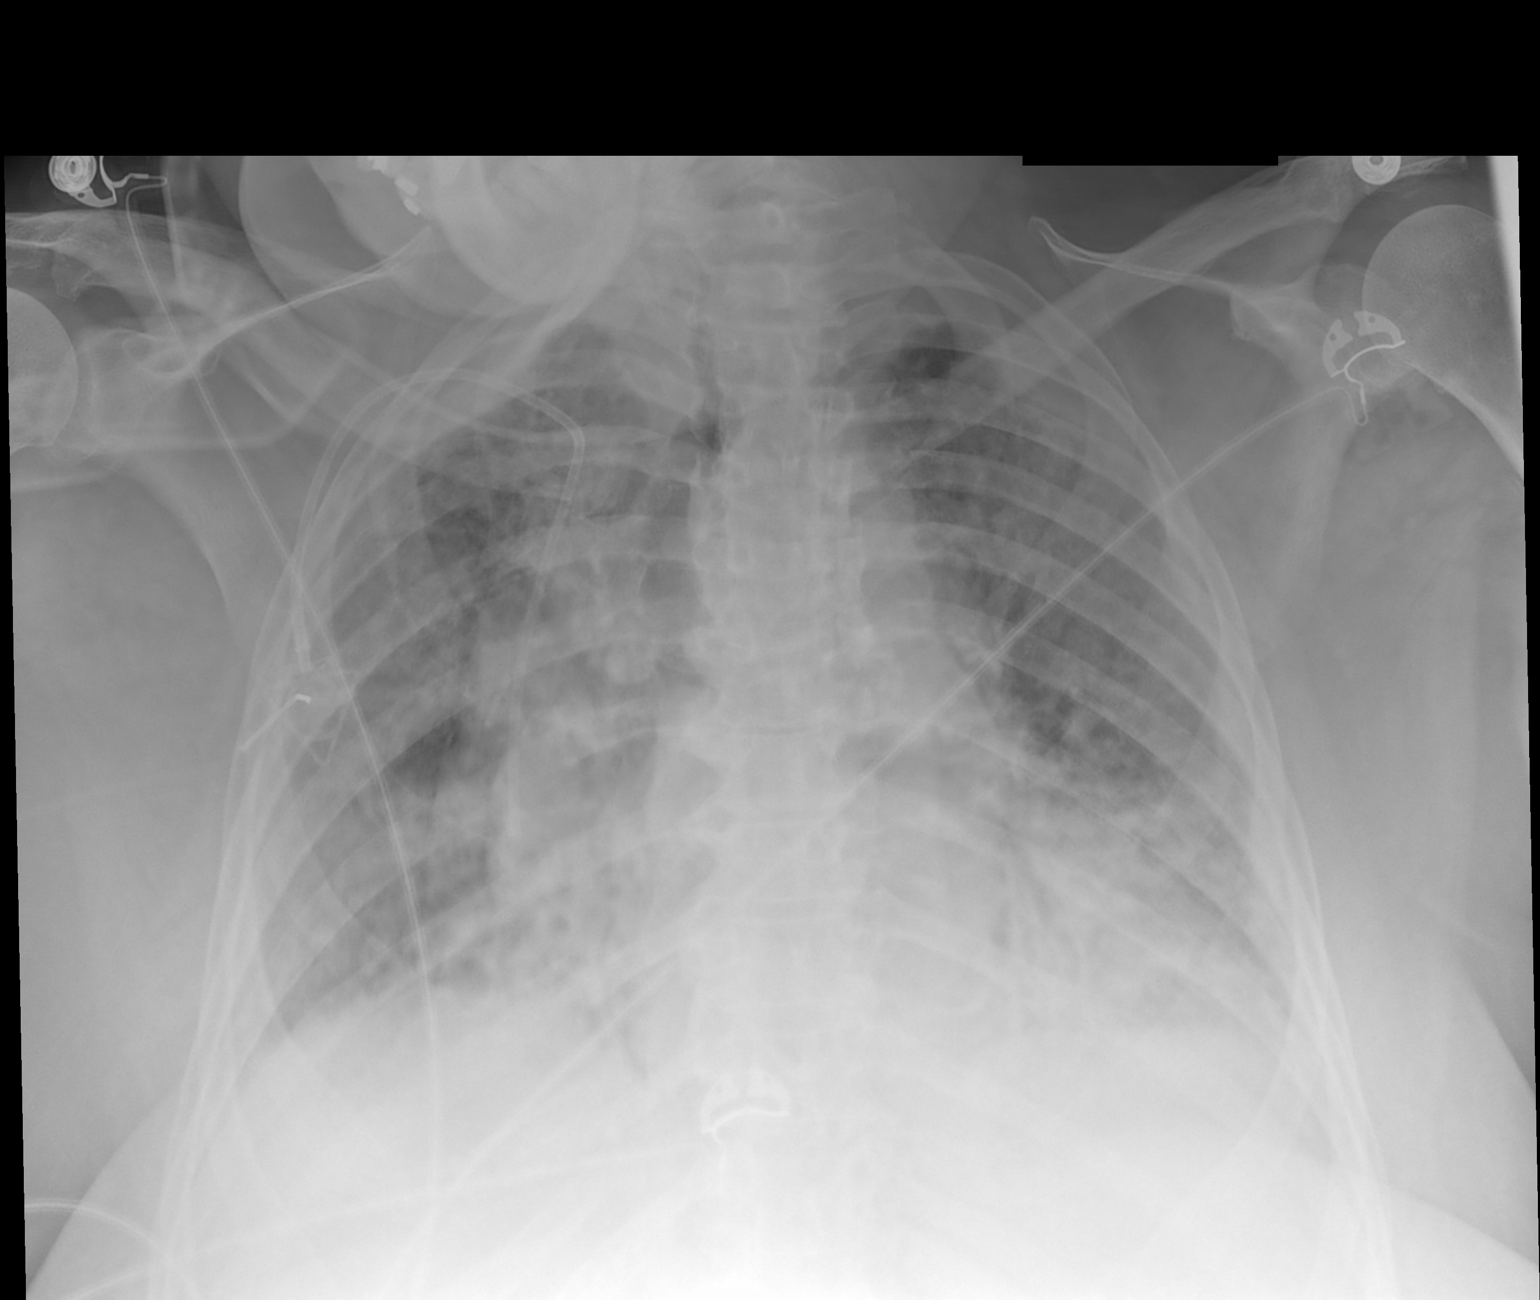

[1 of 1 positions shown; findings below may reference images not displayed]

FINDINGS: Grossly unchanged enlarged cardiac silhouette and
mediastinal contours. Stable position support apparatus.  Grossly
unchanged extensive bilateral heterogeneous air space opacities
with slight right upper and left lower lobe predominance.  Lung
volumes remain reduced.  Trace bilateral effusions are not
excluded.  No pneumothorax.  Unchanged bones.
IMPRESSION: Grossly unchanged extensive bilateral airspace opacities with
differential considerations including multifocal infection,
alveolar pulmonary edema and/or ARDS

## 2014-01-25 ENCOUNTER — Telehealth: Payer: Self-pay | Admitting: Oncology

## 2014-01-25 NOTE — Telephone Encounter (Signed)
Faxed pt medical records to [facility] °
